# Patient Record
Sex: Male | Born: 1935 | Race: White | Hispanic: No | Marital: Married | Smoking: Current some day smoker
Health system: Southern US, Community
[De-identification: ages and names within clinical notes are randomized; demographics above are authoritative.]

## PROBLEM LIST (undated history)

## (undated) DIAGNOSIS — I2510 Atherosclerotic heart disease of native coronary artery without angina pectoris: Secondary | ICD-10-CM

## (undated) DIAGNOSIS — I714 Abdominal aortic aneurysm, without rupture, unspecified: Secondary | ICD-10-CM

## (undated) DIAGNOSIS — I219 Acute myocardial infarction, unspecified: Secondary | ICD-10-CM

## (undated) DIAGNOSIS — I10 Essential (primary) hypertension: Secondary | ICD-10-CM

## (undated) DIAGNOSIS — R0989 Other specified symptoms and signs involving the circulatory and respiratory systems: Secondary | ICD-10-CM

## (undated) DIAGNOSIS — J398 Other specified diseases of upper respiratory tract: Secondary | ICD-10-CM

## (undated) DIAGNOSIS — N400 Enlarged prostate without lower urinary tract symptoms: Secondary | ICD-10-CM

## (undated) DIAGNOSIS — E7800 Pure hypercholesterolemia, unspecified: Secondary | ICD-10-CM

## (undated) DIAGNOSIS — Z860100 Personal history of colon polyps, unspecified: Secondary | ICD-10-CM

## (undated) DIAGNOSIS — J40 Bronchitis, not specified as acute or chronic: Secondary | ICD-10-CM

## (undated) DIAGNOSIS — N323 Diverticulum of bladder: Secondary | ICD-10-CM

## (undated) DIAGNOSIS — R350 Frequency of micturition: Secondary | ICD-10-CM

## (undated) DIAGNOSIS — J449 Chronic obstructive pulmonary disease, unspecified: Secondary | ICD-10-CM

## (undated) DIAGNOSIS — J189 Pneumonia, unspecified organism: Secondary | ICD-10-CM

## (undated) DIAGNOSIS — N401 Enlarged prostate with lower urinary tract symptoms: Secondary | ICD-10-CM

## (undated) DIAGNOSIS — R0602 Shortness of breath: Secondary | ICD-10-CM

## (undated) DIAGNOSIS — R238 Other skin changes: Secondary | ICD-10-CM

## (undated) DIAGNOSIS — C801 Malignant (primary) neoplasm, unspecified: Secondary | ICD-10-CM

## (undated) DIAGNOSIS — R233 Spontaneous ecchymoses: Secondary | ICD-10-CM

## (undated) DIAGNOSIS — N138 Other obstructive and reflux uropathy: Secondary | ICD-10-CM

## (undated) DIAGNOSIS — L853 Xerosis cutis: Secondary | ICD-10-CM

## (undated) HISTORY — DX: Acute myocardial infarction, unspecified: I21.9

## (undated) HISTORY — PX: SKIN SURGERY: SHX2413

## (undated) HISTORY — DX: Pure hypercholesterolemia, unspecified: E78.00

## (undated) HISTORY — DX: Frequency of micturition: R35.0

## (undated) HISTORY — DX: Essential (primary) hypertension: I10

## (undated) HISTORY — DX: Other specified diseases of upper respiratory tract: J39.8

## (undated) HISTORY — DX: Bronchitis, not specified as acute or chronic: J40

## (undated) HISTORY — DX: Atherosclerotic heart disease of native coronary artery without angina pectoris: I25.10

## (undated) HISTORY — DX: Malignant (primary) neoplasm, unspecified: C80.1

## (undated) HISTORY — PX: HERNIA REPAIR: SHX51

## (undated) HISTORY — PX: COLONOSCOPY: SHX174

## (undated) HISTORY — DX: Chronic obstructive pulmonary disease, unspecified: J44.9

## (undated) HISTORY — DX: Shortness of breath: R06.02

---

## 2001-07-01 ENCOUNTER — Encounter: Payer: Self-pay | Admitting: Internal Medicine

## 2001-07-01 ENCOUNTER — Ambulatory Visit (HOSPITAL_COMMUNITY): Admission: RE | Admit: 2001-07-01 | Discharge: 2001-07-01 | Payer: Self-pay | Admitting: Internal Medicine

## 2001-07-10 ENCOUNTER — Other Ambulatory Visit: Admission: RE | Admit: 2001-07-10 | Discharge: 2001-07-10 | Payer: Self-pay | Admitting: Dermatology

## 2002-01-21 ENCOUNTER — Observation Stay (HOSPITAL_COMMUNITY): Admission: RE | Admit: 2002-01-21 | Discharge: 2002-01-22 | Payer: Self-pay | Admitting: Pediatrics

## 2002-08-13 ENCOUNTER — Other Ambulatory Visit: Admission: RE | Admit: 2002-08-13 | Discharge: 2002-08-13 | Payer: Self-pay | Admitting: Dermatology

## 2003-03-10 ENCOUNTER — Other Ambulatory Visit: Admission: RE | Admit: 2003-03-10 | Discharge: 2003-03-10 | Payer: Self-pay | Admitting: Dermatology

## 2003-04-15 ENCOUNTER — Other Ambulatory Visit: Admission: RE | Admit: 2003-04-15 | Discharge: 2003-04-15 | Payer: Self-pay | Admitting: Dermatology

## 2003-10-18 ENCOUNTER — Ambulatory Visit (HOSPITAL_COMMUNITY): Admission: RE | Admit: 2003-10-18 | Discharge: 2003-10-18 | Payer: Self-pay | Admitting: Family Medicine

## 2004-05-25 ENCOUNTER — Other Ambulatory Visit: Admission: RE | Admit: 2004-05-25 | Discharge: 2004-05-25 | Payer: Self-pay | Admitting: Unknown Physician Specialty

## 2004-10-11 ENCOUNTER — Ambulatory Visit (HOSPITAL_COMMUNITY): Admission: RE | Admit: 2004-10-11 | Discharge: 2004-10-11 | Payer: Self-pay | Admitting: Family Medicine

## 2004-10-22 HISTORY — PX: CORONARY ANGIOPLASTY WITH STENT PLACEMENT: SHX49

## 2005-12-31 ENCOUNTER — Ambulatory Visit (HOSPITAL_COMMUNITY): Admission: RE | Admit: 2005-12-31 | Discharge: 2005-12-31 | Payer: Self-pay | Admitting: Family Medicine

## 2006-01-01 ENCOUNTER — Ambulatory Visit (HOSPITAL_COMMUNITY): Admission: RE | Admit: 2006-01-01 | Discharge: 2006-01-01 | Payer: Self-pay | Admitting: Family Medicine

## 2006-01-03 ENCOUNTER — Ambulatory Visit (HOSPITAL_COMMUNITY): Admission: RE | Admit: 2006-01-03 | Discharge: 2006-01-03 | Payer: Self-pay | Admitting: Family Medicine

## 2006-06-17 ENCOUNTER — Ambulatory Visit (HOSPITAL_COMMUNITY): Admission: RE | Admit: 2006-06-17 | Discharge: 2006-06-17 | Payer: Self-pay | Admitting: Family Medicine

## 2006-12-13 ENCOUNTER — Inpatient Hospital Stay (HOSPITAL_COMMUNITY): Admission: EM | Admit: 2006-12-13 | Discharge: 2006-12-20 | Payer: Self-pay | Admitting: Emergency Medicine

## 2006-12-17 HISTORY — PX: CORONARY ANGIOPLASTY WITH STENT PLACEMENT: SHX49

## 2007-01-09 ENCOUNTER — Encounter (HOSPITAL_COMMUNITY): Admission: RE | Admit: 2007-01-09 | Discharge: 2007-02-08 | Payer: Self-pay | Admitting: *Deleted

## 2007-02-10 ENCOUNTER — Encounter (HOSPITAL_COMMUNITY): Admission: RE | Admit: 2007-02-10 | Discharge: 2007-03-12 | Payer: Self-pay | Admitting: *Deleted

## 2007-03-13 ENCOUNTER — Encounter (HOSPITAL_COMMUNITY): Admission: RE | Admit: 2007-03-13 | Discharge: 2007-04-12 | Payer: Self-pay | Admitting: *Deleted

## 2007-10-16 ENCOUNTER — Inpatient Hospital Stay (HOSPITAL_COMMUNITY): Admission: EM | Admit: 2007-10-16 | Discharge: 2007-10-17 | Payer: Self-pay | Admitting: Emergency Medicine

## 2007-12-03 ENCOUNTER — Ambulatory Visit (HOSPITAL_COMMUNITY): Admission: RE | Admit: 2007-12-03 | Discharge: 2007-12-03 | Payer: Self-pay | Admitting: Family Medicine

## 2009-03-28 ENCOUNTER — Ambulatory Visit (HOSPITAL_COMMUNITY): Admission: RE | Admit: 2009-03-28 | Discharge: 2009-03-28 | Payer: Self-pay | Admitting: Family Medicine

## 2009-04-01 ENCOUNTER — Observation Stay (HOSPITAL_COMMUNITY): Admission: EM | Admit: 2009-04-01 | Discharge: 2009-04-03 | Payer: Self-pay | Admitting: Emergency Medicine

## 2009-05-24 HISTORY — PX: US ECHOCARDIOGRAPHY: HXRAD669

## 2009-10-06 ENCOUNTER — Ambulatory Visit (HOSPITAL_COMMUNITY): Admission: RE | Admit: 2009-10-06 | Discharge: 2009-10-06 | Payer: Self-pay | Admitting: Family Medicine

## 2010-04-29 ENCOUNTER — Emergency Department (HOSPITAL_COMMUNITY): Admission: EM | Admit: 2010-04-29 | Discharge: 2010-04-30 | Payer: Self-pay | Admitting: Emergency Medicine

## 2010-06-05 ENCOUNTER — Ambulatory Visit: Payer: Self-pay | Admitting: Surgery

## 2010-07-20 ENCOUNTER — Ambulatory Visit: Payer: Self-pay | Admitting: Internal Medicine

## 2010-07-20 DIAGNOSIS — R198 Other specified symptoms and signs involving the digestive system and abdomen: Secondary | ICD-10-CM | POA: Insufficient documentation

## 2010-07-20 DIAGNOSIS — R634 Abnormal weight loss: Secondary | ICD-10-CM | POA: Insufficient documentation

## 2010-07-20 DIAGNOSIS — R6881 Early satiety: Secondary | ICD-10-CM

## 2010-07-20 DIAGNOSIS — K625 Hemorrhage of anus and rectum: Secondary | ICD-10-CM | POA: Insufficient documentation

## 2010-07-22 HISTORY — PX: COLONOSCOPY: SHX174

## 2010-07-22 HISTORY — PX: ESOPHAGOGASTRODUODENOSCOPY: SHX1529

## 2010-07-26 ENCOUNTER — Ambulatory Visit (HOSPITAL_COMMUNITY): Admission: RE | Admit: 2010-07-26 | Discharge: 2010-07-26 | Payer: Self-pay | Admitting: Internal Medicine

## 2010-07-26 ENCOUNTER — Ambulatory Visit: Payer: Self-pay | Admitting: Internal Medicine

## 2010-07-28 ENCOUNTER — Encounter: Payer: Self-pay | Admitting: Internal Medicine

## 2010-07-28 ENCOUNTER — Telehealth (INDEPENDENT_AMBULATORY_CARE_PROVIDER_SITE_OTHER): Payer: Self-pay

## 2010-11-21 NOTE — Letter (Signed)
Summary: Patient Notice, Colon Biopsy Results  [facility]  [address]   [place], [place] [address]   Phone: [phone]  Fax: [phone]       July 28, 2010   Rulo Sires [address] [place], [place]  [address] 01/09/1932    Dear Mr. Sheckler,  I am pleased to inform you that the biopsies taken during your recent colonoscopy did not show any evidence of cancer upon pathologic examination.  Additional information/recommendations:  You should have a repeat colonoscopy examination  in 3 years.  Please call us if you are having persistent problems or have questions about your condition that have not been fully answered at this time.  Sincerely,    R. Roetta Sessions MD, FACP [facility] Ph: [phone]    Fax: [phone]   Appended Document: Patient Notice, Colon Biopsy Results letter mailed to pt  Appended Document: Patient Notice, Colon Biopsy Results REMINDER IN COMPUTER

## 2010-11-21 NOTE — Progress Notes (Signed)
Summary: pt has questions about polyps  Phone Note Call from Patient   Caller: Patient Summary of Call: Pt came by office. Had some concerns in reference to polyps removed. He said that he thought Dr. Jena Gauss told him that he could not remove rectal polyps. He knows he has a rectal polyp and also just had a knot come up between his scrotum and his leg yesterday.  He wants to know if he should see a dermatologist or a surgeon (Has seen Dr. Margo Aye and Dr. Malvin Johns in the past) Please advise! Initial call taken by: Cloria Spring LPN,  July 28, 2010 1:11 PM     Appended Document: pt has questions about polyps polyps inside rectum and colon were removed; new "knot" as decribed - ot likely GI in origin; might star with PCP  Appended Document: pt has questions about polyps Pt was informed.

## 2010-11-21 NOTE — Assessment & Plan Note (Signed)
Summary: CONSULT FOR TCS. R SIDE PAIN,POLYPS/SS   Visit Type:  new patient Primary Care Provider:  [facility], Dr. Sherwood Gambler  Chief Complaint:  wants tcs.  History of Present Illness: Karl Walker is a pleasant 77 y/o WM, patient of [employer], who presents as self-referral for further evaluation of weight loss, early satiety, change in bowels.   Last few months, change in bowels. Went from daily BM to skipping up to two days without BM.  Never had TCS before. Checked around bottom with mirror and thinks he saw polpys. Some intermittent brbpr, few times in last few years. Never feels hungry. Eats because he has to. Feels full easily.  At age 38, 206 pounds. At 75, 170 pounds. Now 151. Eats regularly but cannot gain weight. No heartburn, n/v, dysphagia, abd pain.  At age 89, took flu shot, in three weeks "got the flu". Next year, took PNA shot and "got pneumonia". Lost 50 pounds. Now unable to excersize. MI and one stent in 2008. Has had PNA total of three times.   CT A/P 7/11, Tiny bilateral nonobstructing renal calculi.  Marked prostatic enlargement. Abdominal aortic aneurysm 4.0 x 3.5 cm, increased since 2007. 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 Question left inguinal hernia.  Questionable thickening of anterior wall of stomach versus artifact from underdistension, recommend follow-up upper GI exam or endoscopy to exclude gastric mass.  Bilateral spondylolysis L5 with grade 1 anterolisthesis.  Current Medications (verified): 1)  Hydrocodone-Acetaminophen 5-325 Mg Tabs (Hydrocodone-Acetaminophen) .... As Needed 2)  Niaspan 500 Mg Cr-Tabs (Niacin (Antihyperlipidemic)) .... Once Daily 3)  Simvastatin 40 Mg Tabs (Simvastatin) .... Once Daily 4)  Losartan Potassium-Hctz 50-12.5 Mg Tabs (Losartan Potassium-Hctz) .... Once Daily 5)  Finasteride 5 Mg Tabs (Finasteride) .... Once Daily 6)  Aspirin 325 Mg Tabs (Aspirin) .... Once Daily 7)  Vitamin D-3 1000iu .... Once  Daily  Allergies (verified): 1)  ! Iodine  Past History:  Past Medical History: TB at age 35 COPD CAD, MI and stent, 2008 BPH, previous benign bx, followed by Dr. Earlene Plater. Hypertension Nephrolithiasis Hyperlipidemia Infrarenal AAA, 4cm, followed by Dr. Myra Gianotti  Past Surgical History: Right inguinal hernia, 2003 Nasal basal cell carcinoma X 2  Family History: No FH CRC. No liver disease.   Social History: Married. Two children. Retired, [employer]. Occasional cig use. Never smoked more than one ppd entire life. This year has smoked about one pack of cig. No alcohol.   Review of Systems General:  Complains of anorexia, weakness, and weight loss; denies fever, chills, sweats, and fatigue. Eyes:  Denies vision loss. ENT:  Denies nasal congestion, sore throat, hoarseness, and difficulty swallowing. CV:  Complains of dyspnea on exertion; denies chest pains, angina, palpitations, and peripheral edema. Resp:  Complains of wheezing; denies dyspnea at rest, dyspnea with exercise, cough, and sputum. GI:  See HPI. GU:  Denies urinary burning and blood in urine. MS:  Denies joint pain / LOM. Derm:  Denies rash and itching. Neuro:  Denies weakness, frequent headaches, memory loss, and confusion. Psych:  Denies depression and anxiety. Endo:  Complains of unusual weight change. Heme:  Denies bruising and bleeding. Allergy:  Denies hives and rash.  Vital Signs:  Patient profile:   77 year old male Height:      71 inches Weight:      151 pounds BMI:     21.14 Temp:     98.6 degrees F oral Pulse rate:   88 / minute BP sitting:   148 /  98  (right arm) Cuff size:   regular  Vitals Entered By: Hendricks Limes LPN (July 20, 2010 1:46 PM)  Physical Exam  General:  Thin, elderly WM, in NAD. Head:  Normocephalic and atraumatic. Eyes:  no scleral icterus Mouth:  Oropharyngeal mucosa moist, pink.  No lesions, erythema or exudate.    Neck:  Supple; no masses or thyromegaly. Lungs:  Clear  throughout to auscultation. Heart:  Regular rate and rhythm; no murmurs, rubs,  or bruits. Abdomen:  Bowel sounds normal.  Abdomen is soft, nontender, nondistended.  No rebound or guarding.  No hepatosplenomegaly, masses or hernias.  No abdominal bruits.  Rectal:  deferred until time of colonoscopy.   Extremities:  No clubbing, cyanosis, edema or deformities noted. Neurologic:  Alert and  oriented x4;  grossly normal neurologically. Skin:  Intact without significant lesions or rashes. Cervical Nodes:  No significant cervical adenopathy. Psych:  Alert and cooperative. Normal mood and affect.  Impression & Recommendations:  Problem # 1:  CHANGE IN BOWELS (ICD-787.99)  Two month h/o change in bowels. Patient reports seeing couple "polyps" around perianal area. Suspect hemorrhoids. No prior TCS. Some intermiitent brbpr. Gradual weight loss over last five-6 years. Colonoscopy to be performed in near future.  Risks, alternatives, and benefits including but not limited to the risk of reaction to medication, bleeding, infection, and perforation were addressed.  Patient voiced understanding and provided verbal consent.   Orders: New Patient Level III (41324)  Problem # 2:  WEIGHT LOSS (ICD-783.21)  Weight loss in setting of multiple episodes of PNA/MI over course of last five years. Patient had CT couple of months ago with abnormal appearing stomach vs underdistention. Given early satiety, recommend EGD. EGD to be performed in near future.  Risks, alternatives, benefits including but not limited to risk of reaction to medications, bleeding, infection, and perforation addressed.  Patient voiced understanding and verbal consent obtained.   Orders: New Patient Level III [phone])

## 2010-11-21 NOTE — Letter (Signed)
Summary: TCS/EGD ORDER  TCS/EGD ORDER   Imported By: Ave Filter 07/20/2010 14:56:19  _____________________________________________________________________  External Attachment:    Type:   Image     Comment:   External Document

## 2011-01-07 LAB — URINE CULTURE
Colony Count: NO GROWTH
Culture: NO GROWTH

## 2011-01-07 LAB — BASIC METABOLIC PANEL
BUN: 18 mg/dL (ref 6–23)
CO2: 25 mEq/L (ref 19–32)
Calcium: 9.4 mg/dL (ref 8.4–10.5)
Chloride: 104 mEq/L (ref 96–112)
Creatinine, Ser: 0.87 mg/dL (ref 0.4–1.5)
GFR calc Af Amer: >60 mL/min (ref >60)
GFR calc non Af Amer: >60 mL/min (ref >60)
Glucose, Bld: 102 mg/dL — ABNORMAL HIGH (ref 70–99)
Potassium: 3.7 mEq/L (ref 3.5–5.1)
Sodium: 135 mEq/L (ref 135–145)

## 2011-01-07 LAB — URINALYSIS, ROUTINE W REFLEX MICROSCOPIC
Bilirubin Urine: NEGATIVE
Glucose, UA: NEGATIVE mg/dL
Nitrite: NEGATIVE
Protein, ur: NEGATIVE mg/dL
Specific Gravity, Urine: >1.03 — ABNORMAL HIGH (ref 1.005–1.030)
Urobilinogen, UA: 0.2 mg/dL (ref 0.0–1.0)
pH: 6 (ref 5.0–8.0)

## 2011-01-07 LAB — CBC
HCT: 51.9 % (ref 39.0–52.0)
Hemoglobin: 17.3 g/dL — ABNORMAL HIGH (ref 13.0–17.0)
MCH: 31.5 pg (ref 26.0–34.0)
MCHC: 33.4 g/dL (ref 30.0–36.0)
MCV: 94.4 fL (ref 78.0–100.0)
Platelets: 139 10*3/uL — ABNORMAL LOW (ref 150–400)
RBC: 5.5 MIL/uL (ref 4.22–5.81)
RDW: 14.9 % (ref 11.5–15.5)
WBC: 10.9 10*3/uL — ABNORMAL HIGH (ref 4.0–10.5)

## 2011-01-07 LAB — URINE MICROSCOPIC-ADD ON

## 2011-01-07 LAB — DIFFERENTIAL
Basophils Absolute: 0 10*3/uL (ref 0.0–0.1)
Basophils Relative: 0 % (ref 0–1)
Eosinophils Absolute: 0.2 10*3/uL (ref 0.0–0.7)
Eosinophils Relative: 2 % (ref 0–5)
Lymphocytes Relative: 10 % — ABNORMAL LOW (ref 12–46)
Lymphs Abs: 1 10*3/uL (ref 0.7–4.0)
Monocytes Absolute: 0.7 10*3/uL (ref 0.1–1.0)
Monocytes Relative: 6 % (ref 3–12)
Neutro Abs: 9 10*3/uL — ABNORMAL HIGH (ref 1.7–7.7)
Neutrophils Relative %: 82 % — ABNORMAL HIGH (ref 43–77)

## 2011-01-29 LAB — CULTURE, BLOOD (ROUTINE X 2)
Culture: NO GROWTH
Report Status: 6162010
Report Status: 6162010

## 2011-01-29 LAB — LIPID PANEL
Cholesterol: 104 mg/dL (ref 0–200)
HDL: 46 mg/dL (ref >39)
Total CHOL/HDL Ratio: 2.3 RATIO
Triglycerides: 51 mg/dL (ref <150)

## 2011-01-29 LAB — BASIC METABOLIC PANEL
BUN: 20 mg/dL (ref 6–23)
BUN: 22 mg/dL (ref 6–23)
CO2: 31 mEq/L (ref 19–32)
CO2: 33 mEq/L — ABNORMAL HIGH (ref 19–32)
Calcium: 9.1 mg/dL (ref 8.4–10.5)
Calcium: 9.7 mg/dL (ref 8.4–10.5)
Chloride: 102 mEq/L (ref 96–112)
Creatinine, Ser: 0.94 mg/dL (ref 0.4–1.5)
Creatinine, Ser: 1.07 mg/dL (ref 0.4–1.5)
GFR calc Af Amer: >60 mL/min (ref >60)
GFR calc non Af Amer: >60 mL/min (ref >60)
GFR calc non Af Amer: >60 mL/min (ref >60)
Glucose, Bld: 126 mg/dL — ABNORMAL HIGH (ref 70–99)
Glucose, Bld: 160 mg/dL — ABNORMAL HIGH (ref 70–99)
Potassium: 3.7 mEq/L (ref 3.5–5.1)
Sodium: 141 mEq/L (ref 135–145)

## 2011-01-29 LAB — DIFFERENTIAL
Basophils Relative: 0 % (ref 0–1)
Basophils Relative: 0 % (ref 0–1)
Eosinophils Absolute: 0 10*3/uL (ref 0.0–0.7)
Eosinophils Relative: 0 % (ref 0–5)
Lymphs Abs: 0.7 10*3/uL (ref 0.7–4.0)
Lymphs Abs: 0.8 10*3/uL (ref 0.7–4.0)
Monocytes Absolute: 1.9 10*3/uL — ABNORMAL HIGH (ref 0.1–1.0)
Monocytes Absolute: 2 10*3/uL — ABNORMAL HIGH (ref 0.1–1.0)
Monocytes Relative: 14 % — ABNORMAL HIGH (ref 3–12)
Monocytes Relative: 14 % — ABNORMAL HIGH (ref 3–12)
Neutro Abs: 12.2 10*3/uL — ABNORMAL HIGH (ref 1.7–7.7)
Neutrophils Relative %: 80 % — ABNORMAL HIGH (ref 43–77)
Neutrophils Relative %: 81 % — ABNORMAL HIGH (ref 43–77)

## 2011-01-29 LAB — BRAIN NATRIURETIC PEPTIDE: Pro B Natriuretic peptide (BNP): 107 pg/mL — ABNORMAL HIGH (ref 0.0–100.0)

## 2011-01-29 LAB — CBC
HCT: 51 % (ref 39.0–52.0)
Hemoglobin: 17.4 g/dL — ABNORMAL HIGH (ref 13.0–17.0)
MCHC: 34.1 g/dL (ref 30.0–36.0)
MCHC: 34.9 g/dL (ref 30.0–36.0)
MCV: 94.5 fL (ref 78.0–100.0)
Platelets: 162 10*3/uL (ref 150–400)
Platelets: 185 10*3/uL (ref 150–400)
RBC: 5.4 MIL/uL (ref 4.22–5.81)
RDW: 14.3 % (ref 11.5–15.5)
RDW: 14.6 % (ref 11.5–15.5)
WBC: 13.3 10*3/uL — ABNORMAL HIGH (ref 4.0–10.5)

## 2011-01-29 LAB — BLOOD GAS, ARTERIAL
Acid-Base Excess: 4.1 mmol/L — ABNORMAL HIGH (ref 0.0–2.0)
Bicarbonate: 28.1 mEq/L — ABNORMAL HIGH (ref 20.0–24.0)
O2 Content: 4 L/min
O2 Saturation: 94 %
Patient temperature: 37
TCO2: 23.5 mmol/L (ref 0–100)
pCO2 arterial: 42.2 mmHg (ref 35.0–45.0)
pH, Arterial: 7.438 (ref 7.350–7.450)
pO2, Arterial: 70.6 mmHg — ABNORMAL LOW (ref 80.0–100.0)

## 2011-01-29 LAB — COMPREHENSIVE METABOLIC PANEL
ALT: 14 U/L (ref 0–53)
Albumin: 2.7 g/dL — ABNORMAL LOW (ref 3.5–5.2)
Alkaline Phosphatase: 44 U/L (ref 39–117)
BUN: 16 mg/dL (ref 6–23)
Calcium: 8.8 mg/dL (ref 8.4–10.5)
Glucose, Bld: 136 mg/dL — ABNORMAL HIGH (ref 70–99)
Potassium: 3.9 mEq/L (ref 3.5–5.1)
Sodium: 142 mEq/L (ref 135–145)
Total Protein: 5.4 g/dL — ABNORMAL LOW (ref 6.0–8.3)

## 2011-01-29 LAB — TSH: TSH: 0.154 u[IU]/mL — ABNORMAL LOW (ref 0.350–4.500)

## 2011-02-23 ENCOUNTER — Ambulatory Visit (INDEPENDENT_AMBULATORY_CARE_PROVIDER_SITE_OTHER): Payer: Medicare HMO | Admitting: Urology

## 2011-02-23 DIAGNOSIS — N529 Male erectile dysfunction, unspecified: Secondary | ICD-10-CM

## 2011-02-23 DIAGNOSIS — N138 Other obstructive and reflux uropathy: Secondary | ICD-10-CM

## 2011-02-23 DIAGNOSIS — R82998 Other abnormal findings in urine: Secondary | ICD-10-CM

## 2011-02-23 DIAGNOSIS — N3020 Other chronic cystitis without hematuria: Secondary | ICD-10-CM

## 2011-03-06 NOTE — H&P (Signed)
NAME:  CLAUDIA, ALVIZO                ACCOUNT NO.:  [id number]   MEDICAL RECORD NO.:  [id number]          PATIENT TYPE:  OBV   LOCATION:  A316                          FACILITY:  [facility]   PHYSICIAN:  Renee Ramus, MD       DATE OF BIRTH:  02/04/1932   DATE OF ADMISSION:  04/01/2009  DATE OF DISCHARGE:  LH                              HISTORY & PHYSICAL   HISTORY OF PRESENT ILLNESS:  The patient is a 77 year old male who was  seen in the emergency department secondary to increasing cough and  fevers x5 days prior to admission.  The patient was seen by his primary  care physician and 5 days of azithromycin and prednisone without effect.  The patient presents now with increasing cough and subjective fever.   PRIMARY CARE PHYSICIAN:  Kirk Ruths, MD   PAST MEDICAL HISTORY:  1. Coronary artery disease, status post stent in 2008.  2. Benign prostatic hypertrophy.  3. Hypertension.  4. COPD.  5. Hyperlipidemia.   SOCIAL HISTORY:  The patient lives with his wife.  Heavy smoking  history, but quit approximately 2 years prior.  No alcohol history.   FAMILY HISTORY:  Not available.   REVIEW OF SYSTEMS:  All other comprehensive review systems are negative.   ALLERGIES:  The patient has allergy to IODINE.   CURRENT MEDICATIONS:  1. Benicar/hydrochlorothiazide 20/12.5 one p.o. daily.  2. Simvastatin 40 mg p.o. daily.  3. Avodart 0.5 mg p.o. daily.  4. Plavix 75 mg p.o. daily.  5. Toprol-XL 50 mg p.o. daily.  6. Niaspan ER 500 mg p.o. daily.  7. Recently completed prednisone taper.  8. Aspirin 81 mg p.o. daily.  9. Azithromycin and recently completed Z-Pak.   PHYSICAL EXAMINATION:  GENERAL:  Well-developed, well-nourished white  male, currently in no apparent distress.  VITAL SIGNS:  Blood pressure 152/77, heart rate 106, respiratory rate  22, and temperature 99.1.  HEENT: No jugular venous distention or lymphadenopathy.  Oropharynx is  clear.  Mucous membranes pink and moist.   TMs clear bilaterally.  Pupils  equal,  reactive to light and accommodation.  Extraocular muscles are  intact.  CARDIOVASCULAR:  Regular rate and rhythm without murmurs, rubs, or  gallops.  PULMONARY:  The patient has coarse rales without rhonchi bilaterally,  probably slightly increased in the right lower.  ABDOMEN:  Soft, nontender, nondistended without hepatosplenomegaly.  Bowel sounds are present.  He has no rebound or guarding.  EXTREMITIES:  He has no clubbing, cyanosis, or edema.  He has good  peripheral pulses in dorsalis pedis and radial arteries.  He is able to  move all extremities.  NEUROLOGIC:  Cranial nerves II-XII are grossly intact.  He has no focal  neurological deficits.   STUDIES:  Chest x-ray shows COPD with right lower lobe infiltrate.   LABORATORY DATA:  White count 13.3, H and H 17.4 and 51, MCV 94,  platelets 185.  Sodium 141, potassium 3.7, chloride 102, bicarb 31, BUN  22, creatinine 0.9, glucose 126, and BNP of 107.  EKG shows sinus tach.   ASSESSMENT  AND PLAN:  1. Pneumonia.  We will treat with ciprofloxacin.  2. Chronic obstructive pulmonary disease exacerbation, will prednisone      with a slow taper, add nebs as needed.  3. Coronary artery disease.  Currently, the patient has been on Plavix      and aspirin for 3 years since he had a stent placed, guidelines      would suggest that he would do just fine without the Plavix and so      we will discontinue Plavix and continue aspirin.  4. Hypercholesterolemia.  We will check lipid panel.  Continue statin      for now, hold Niaspan.  5. Hypertension.  We will discontinue hydrochlorothiazide, start      captopril and transition to lisinopril at discharge.  6. Benign prostatic hypertrophy.  Continue Avodart.  7. Disposition.  The patient is full code.  H and P was constructed by      reviewing past medical history, conferring with emergency medical      room physician, reviewing the emergency medical  record.  Time spent      1 hour.      Renee Ramus, MD  Electronically Signed     JF/MEDQ  D:  04/01/2009  T:  04/02/2009  Job:  [id number]   cc:   Kirk Ruths, M.D.  Fax: [phone]

## 2011-03-06 NOTE — Discharge Summary (Signed)
NAME:  Karl [name], Karl [name]                ACCOUNT NO.:  [id number]   MEDICAL RECORD NO.:  [id number]          PATIENT TYPE:  OBV   LOCATION:  A316                          FACILITY:  [facility]   PHYSICIAN:  Osvaldo Shipper, MD     DATE OF BIRTH:  [birth year]   DATE OF ADMISSION:  04/01/2009  DATE OF DISCHARGE:  06/13/2010LH                               DISCHARGE SUMMARY   The patient's PMD is Dr. Nobie Putnam.   Please review H and P dictated at the time of admission for details  regarding the patient's presenting illness.   DISCHARGE DIAGNOSES:  1. Chronic obstructive pulmonary disease exacerbation with pneumonia,      improving.  2. Coronary artery disease stable.  3. Hypertension stable.   BRIEF HOSPITAL COURSE:  Briefly, this is a 77 year old Caucasian male  who presented to the hospital with almost 1-week history of shortness of  breath and cough.  He was being treated as an outpatient by his PMD,  however, the patient continued to get worse and so he decided to show up  in the ED on Friday.  He was admitted to the hospital.  X-ray showed  pneumonia.  He was started on antibiotics.  Over the last day and a half  he has been feeling much better.  He is breathing much better.  Cough is  improving.  Expectoration is coming down.  Denies any chest pain.  He  has not had any fever and he is keen on going home.  Rest of his medical  issues have been stable.  I emphasized the importance of smoking  cessation.  I emphasized the importance of taking and being compliant  with his medication regimen.   PHYSICAL EXAMINATION:  GENERAL:  Today, the patient is feeling well.  He  has ambulated from the bathroom to his bed with no difficulties.  He has  been off the oxygen and he is feeling much better.  VITAL SIGNS:  His sats were checked and they were ranging between 89-95%  depending on whether he was talking or breathing deeply.  His  respiratory rate is normal.  He is not using any accessory  muscles.  LUNGS:  Clear to auscultation bilaterally.   LABORATORY DATA:  Labs today are unremarkable and stable.   His lipid panel was checked and was normal.  His free T4 was 1.34.  Blood cultures have not shown any growth.   At this time, the patient is stable for discharge.  I have told him not  to exert himself at home.  I have told him to take his nebulizer  treatments at least 4 times a day and be compliant with his medications.   DISCHARGE MEDICATIONS:  1. Advair 250/50 b.i.d.  2. Levaquin 500 mg once daily for 5 days.  3. Prednisone 60 mg daily for 5 days, followed by 40 mg daily for 5      days, followed by 20 mg daily for 5 days, and then stop.  4. He needs to take albuterol and Atrovent nebs 4 times daily every 6  hours until he sees his doctor.  5. Continue with home medicines which include Benicar HCT 20/12.5      daily.  6. Simvastatin 40 mg daily.  7. Avodart 0.5 mg daily.  8. Plavix 75 mg daily.  9. Toprol-XL 50 mg daily.  10.Niaspan ER 500 mg daily.  11.Aspirin 81 mg daily.   FOLLOWUP:  Follow up with his PMD in the 2-3 days.   DIET:  Heart healthy.   PHYSICAL ACTIVITY:  No exertion.   CONSULTATIONS:  No consultations obtained during this admission.   TOTAL TIME OF THIS ENCOUNTER:  35 minutes.      Osvaldo Shipper, MD  Electronically Signed     GK/MEDQ  D:  04/03/2009  T:  04/04/2009  Job:  [id number]   cc:   Patrica Duel, M.D.  Fax: [phone]

## 2011-03-06 NOTE — Consult Note (Signed)
NAME:  Karl [name], Karl [name]                ACCOUNT NO.:  [id number]   MEDICAL RECORD NO.:  [id number]          PATIENT TYPE:  INP   LOCATION:  A210                          FACILITY:  [facility]   PHYSICIAN:  Edward L. Juanetta Gosling, M.D.DATE OF BIRTH:  10-Aug-1932   DATE OF CONSULTATION:  10/17/2007  DATE OF DISCHARGE:                                 CONSULTATION   REASON FOR CONSULTATION:  COPD.   [name] is a 77 year old who came to the a emergency room because of  shortness of breath.  He has a long smoking history, says he smoked all  my life until about March of this year at which point he stopped  smoking after an acute MI.  His wife, however, does still smoke, and he  says he feels like he does get some effect with that.  He was in his  usual state of fair health at home, said that he had been able to be  fairly active, did not notice any shortness of breath to any great  degree, and then he, on the morning of admission, became much worse.  He  had some shortness of breath for a day or two previously but became much  worse on the morning of admission.  He was wheezing and came to the  emergency room.  He had been coughing up some yellowish sputum.  He felt  like he might have had some fever, but he did not actually measure his  temperature. He had been wheezing.  He did not have any chest pain.  He  has not had any symptoms of congestive heart failure, although he does  have a low ejection fraction seen after his myocardial infarction.   MEDICATIONS AT HOME:  1. Benicar/hydrochlorothiazide 20/12.5 daily.  2. Simvastatin 40 mg daily.  3. Avodart 0.5 daily.  4. Plavix 75 mg daily.  5. Niaspan ER 500 mg daily.  6. Toprol XL 50 mg daily.  7. Aspirin 81 mg daily.   ALLERGIES:  He has an allergy to IODINE and SHELLFISH.Marland Kitchen   PAST MEDICAL HISTORY:  1. He had a myocardial infarction in March 2008.  2. He has a history of BPH.  He does have an elevated PSA. He has been      on Avodart, and this  has worked very well, but he does do      intermittent self catheterizations at home..  3. He has hypertension.  4. Previous history of COPD.  5. Pneumonia.  6. Surgically, he has had a hernia repair and has had basal cell      carcinoma on his face.  7. He says he had childhood TB.  The details of that are not quite      clear, but it sounds like he was treated with INH.   SOCIAL HISTORY:  He has about a 40 to 50-pack-year smoking history.  He  does not use any alcohol, does not use any illicit drugs.  He is  married, lives at home with his wife.   FAMILY HISTORY:  Positive for TB, positive for arthritis.  REVIEW OF SYSTEMS:  Otherwise is negative.  No weight loss.  No  hemoptysis.  No chest pain.   PHYSICAL EXAMINATION:  GENERAL:  He looks pretty comfortable.  VITAL SIGNS:  His O2Sat was 83% on room air on admission.  He says he is  back to about 95% of normal.  His temperature is 98.2, pulse 84,  respirations 16, blood pressure 101/60, O2Sat 92% on 2 liters.  CHEST:  Clear without wheezes.  He does have some rhonchi.  HEART:  Regular without gallop.  I do not hear a murmur.  ABDOMEN:  Soft.  Bowel sounds present and active.  EXTREMITIES:  Showed no edema.  CNS:  Grossly intact.   LABORATORY WORK:  White count 12,600, hemoglobin 14.9, platelets 193.  His electrolytes were normal.  BUN 23, creatinine 1.3.  His CK is  normal.   Chest x-ray showed no definite abnormality except for bronchitic  changes.   Chest CT negative for pulmonary embolism and showed a lot of mucus  plugging.  This would be consistent with his history.   ASSESSMENT:  I think he has chronic obstructive pulmonary disease,  apparently mostly a chronic bronchitic problem.   PLAN:  My plan would be for him to start on Advair. I thought about  Spiriva, but I think with his problems with his prostate,  particularly  with having to do intermittent self catheterizations, I think that using  Spiriva may cause  him trouble; so I think we should switch him over to  Advair.  He probably needs to have PFTs, etc., done as an outpatient.  I  have told him that I would be glad to see him in my office at Dr.  Geanie Logan discretion.   Thanks for allowing me to see him with you.      Edward L. Juanetta Gosling, M.D.  Electronically Signed     ELH/MEDQ  D:  10/17/2007  T:  10/17/2007  Job:  [id number]   cc:   Patrica Duel, M.D.  Fax: [phone]

## 2011-03-06 NOTE — Assessment & Plan Note (Signed)
OFFICE VISIT   [name], [name]  DOB:  05-10-1932                                       06/05/2010  EAVWU#:[id number]   REASON FOR VISIT:  Abdominal aortic aneurysm.   HISTORY:  This is a 77 year old gentleman I am seeing at the request of  Dr. Regino Schultze for evaluation of abdominal aneurysm.  The patient presented  to emergency department a couple of weeks ago with right flank and hip  pain.  He obtained a CT scan which revealed a 4 x 3.5-cm infrarenal  abdominal aortic aneurysm.   The patient's risk factors include hypertension and  hypercholesterolemia, both of which are medically managed.  Has a  history of heart attack in 2007, also suffers from COPD.  He continues  to smoke.  Two packs last him about 2 weeks.   REVIEW OF SYSTEMS:  GENERAL:  Positive weight loss.  CARDIAC:  Positive for shortness of breath with exertion.  PULMONARY:  Positive for bronchitis.  GU:  Positive for frequent urination.  All other review systems is negative.   PAST MEDICAL HISTORY:  Hypertension, hypercholesterolemia, history of MI  in 2007, COPD.   PAST SURGICAL HISTORY:  1. Coronary stent.  2. A hernia.   SOCIAL HISTORY:  He is married with 2 children.  He is retired.  Continues to smoke a pack a week.  Does not drink regular alcohol.   MEDICATIONS:  Percocet, Avodart, Benicar, Niaspan, simvastatin, aspirin,  vitamin D and fish oil.   ALLERGIES:  Iodine.   PHYSICAL EXAMINATION:  Heart rate 89, blood pressure 135/82, O2  saturation is 97%.  Generally, he is well-appearing, in no distress.  HEENT:  Within normal limits.  Lungs are clear bilaterally.  No wheezes  or rhonchi.  Cardiovascular:  Regular rate and rhythm.  No carotid  bruits.  Palpable pedal pulses.  Abdomen is soft, nontender.  His aorta  is readily palpable.  No hepatosplenomegaly.  Musculoskeletal:  Without  major deformity.  Neuro:  He has no focal deficits or paresthesias.  His  skin is without  rash.   DIAGNOSTIC STUDIES:  I have reviewed his CT scan, and it shows a 4-cm  aneurysm.   ASSESSMENT/PLAN:  Infrarenal abdominal aortic aneurysm.   PLAN:  I discussed the findings of the CT scan with the patient.  I  reiterated to him that I do not think that his pain is related to his  aneurysm.  We discussed continuing to follow as aneurysm bit making  recommendations for repair as he gets larger than 5 cm.  I will plan on  seeing him back in 1 year.  All of his questions were answered today.     Jorge Ny, MD  Electronically Signed   VWB/MEDQ  D:  06/05/2010  T:  06/06/2010  Job:  [id number]   cc:   Dr. Regino Schultze

## 2011-03-06 NOTE — Discharge Summary (Signed)
NAME:  Karl [name], Karl [name]                ACCOUNT NO.:  [id number]   MEDICAL RECORD NO.:  [id number]          PATIENT TYPE:  INP   LOCATION:  A210                          FACILITY:  [facility]   PHYSICIAN:  Marcello Moores, MD   DATE OF BIRTH:  30-Sep-1932   DATE OF ADMISSION:  10/16/2007  DATE OF DISCHARGE:  LH                               DISCHARGE SUMMARY   PRIMARY MEDICAL DOCTOR:  Patrica Duel, M.D.   DISCHARGING DIAGNOSES:  1. Bronchitis, resolving.  2. History of MI in February 2008 status post stent placement.  3. History of benign prostatic hypertrophy with elevated PCA.  He has      follow-up with urologist.  4. History of hypertension, fairly controlled.  5. History of pneumonia.  6. History of emphysema.   HOME MEDICATIONS:  1. Levaquin 500 mg p.o. daily for 5 days.  2. Benicar/hydrochlorothiazide 20/12.5 mg once a day.  3. Simvastatin 40 mg p.o. daily.  4. Avodart 0.5 mg once a day.  5. Plavix 75 mg p.o. once a day.  6. Niaspan ER 500 mg once a day.  7. Toprol XL 50 mg once a day.  8. Aspirin 81 mg once a day.  9. Xopenex 1.25 mg inhaler every 6 hours.   HOSPITAL COURSE:  [name] is a 77 year old man with history of  coronary artery disease status post MI and stent placement and  hypertension with history of smoking in the past who presented with  shortness of breath and was admitted with assessment of bronchitis.  Chest x-ray was done, and it was showing no acute cardiopulmonary  disease with COPD and CAT scan of the chest was also done to rule out  PE, and there was not any sign of PE or any tick dissection.  The  patient was put on Solu-Medrol and Levaquin antibiotic and the patient  responded well.  He remained afebrile and there was no leukocytosis, and  today he is very stable.  He will go home with p.o. medications as  mentioned above.   PHYSICAL EXAMINATION:  VITAL SIGNS: Today temperature 98, pulse is 84  and respiratory rate 16, blood pressure is 101/60 and  saturation is 95%  on 2 liters.  HEENT:  He has pink conjunctivae, nonicteric sclera.  NECK:  Supple.  CHEST:  He has good air entry bilaterally with some rhonchi.  Otherwise,  there is no wheezes.  CV: S1, S2 well heard.  ABDOMEN:  Soft area of tenderness.  EXTREMITIES:  No pedal edema.  CNS:  He is alert and well-oriented.   LABORATORY DATA:  White blood cells is 10.6, and hemoglobin is 14.9,  hematocrit is 45, platelet is 193.  Chemistry:  Sodium is 139, potassium  is 3.7, chloride 104 and bicarb is 26, glucose is 184, BUN 23 and  creatinine is 1.3.  blood culture is negative in 24 hours.   DISCHARGE/PLAN:  He will be discharged home today with the above  medications, and he will have follow-up with his PMD after 4 days.  His  elevated glucose is likely secondary to the Solu-Medrol  he was taking,  and the little bit elevated white blood cells also probably related to  his Solu-Medrol but needs to follow-up with his PMD.      Marcello Moores, MD  Electronically Signed     MT/MEDQ  D:  10/17/2007  T:  10/17/2007  Job:  [id number]

## 2011-03-06 NOTE — H&P (Signed)
NAME:  Karl [name], Karl [name]                ACCOUNT NO.:  [id number]   MEDICAL RECORD NO.:  [id number]          PATIENT TYPE:  INP   LOCATION:  A210                          FACILITY:  [facility]   PHYSICIAN:  Osvaldo Shipper, MD     DATE OF BIRTH:  April 08, 1932   DATE OF ADMISSION:  10/16/2007  DATE OF DISCHARGE:  LH                              HISTORY & PHYSICAL   PRIMARY CARE PHYSICIAN:  Patrica Duel, M.D.   He is also followed by Dr. Domingo Sep with [facility] and Dr.  Earlene Plater, who is a urologist is [facility] for B[facility].   ADMISSION DIAGNOSES:  1. Likely acute bronchitis.  2. History of coronary artery disease status post percutaneous      transluminal cardiac angioplasty.  3. Hypertension.  4. Benign prostatic hypertrophy with elevated PSA.   CHIEF COMPLAINT:  Shortness of breath since yesterday.   HISTORY OF PRESENT ILLNESS:  The patient is a 77 year old Caucasian male  who has a history of CAD who was a smoker.  He was in his usual state of  health until yesterday, or maybe one day before yesterday, when he  started feeling what he describes as congestion and shortness of breath.  These symptoms got worse today, and he decided to come into the ED.  He  has been having a cough with slight yellowish expectoration. He has been  having wheezing.  He denies any chest pain.  Shortness of breath is more  with exertion, and it is relieved with lying down.  He denies any leg  swelling.  Denies any fever or chills at home.  Denies any sick  contacts.  He says he quit smoking back in February when he had a heart  attack, but his wife continues to smoke heavily in the same house as he.   MEDICATIONS AT HOME:  1. Benicar/hydrochlorothiazide 20/12.5 mg once a day.  2. Simvastatin 40 mg once a day.  3. Avodart 0.5 mg once a day.  4. Plavix 75 mg once a day.  5. Niaspan ER 500 mg once a day.  6. Toprol XL 50 mg once a day.  7. Aspirin 81 mg once a day.   ALLERGIES:  Include IODINE and  SHRIMP.   PAST MEDICAL HISTORY:  1. MI in February 2008, and he was found to have an occluded RCA which      ws stented.  2. He has a history of benign prostatic hypertrophy with elevated PSA      and is followed by a urologist.  3. History of hypertension.  4. History of pneumonia one year ago.  5. He also has a history of emphysema which has never been properly      evaluated.   OTHER SURGICAL HISTORY:  1. Surgery for hernia repair.  2. Two surgeries for basal cell carcinoma on his face.   SOCIAL HISTORY:  He lives in [place] with his wife.  He quit smoking  10 months ago, has about approximately 40 to 50-pack-year history of  smoking.  His wife continues to smoke a pack and one-half of cigarettes  on a daily basis.  No alcohol use, no illicit drug use.   FAMILY HISTORY:  Positive for arthritis.   REVIEW OF SYSTEMS:  GENERAL:  Positive for weakness.  HEENT:  Unremarkable.  CARDIOVASCULAR:  As in HPI.  RESPIRATORY:  As in HPI.  GI:  Unremarkable.  GU: Unremarkable.  MUSCULOSKELETAL:  Unremarkable.  NEUROLOGIC:  Unremarkable.  PSYCHIATRIC:  Unremarkable.  Other systems  are unremarkable.   PHYSICAL EXAMINATION:  VITAL SIGNS:  Temperature 98.1, blood pressure  154/73, heart rate 97, respiratory rate about 20. Saturation when he  came in was 83% on room air, 88% on 4 liters, and currently about 94% on  4 liters.  GENERAL:  Well-developed, well-nourished white male in no distress,  slightly tachypneic but no distress.  HEENT:  No pallor, no icterus.  Oral mucous membranes moist.  No oral  lesions are noted.  NECK:  Soft and supple.  No thyromegaly appreciated.  LUNGS: Reveal rhonchi bilaterally.  No definite crackles.  A few wheeze  appreciated with diffuse rhonchi.  CARDIOVASCULAR:  S1 and S2 normal, regular.  No murmurs appreciated.  No  S3, S4.  No rubs, no bruits.  Peripheral pulses palpable.  No edema is  present in the extremities.  ABDOMEN:  Soft, nontender,  nondistended.  Bowel sounds present.  No mass  or organomegaly is appreciated.  MUSCULOSKELETAL:  Unremarkable.  NEUROLOGIC:  Alert and oriented x3.  No focal neurological deficits are  present.   LABORATORY DATA:  ABG was done which showed a pH of 7.46, pCO2 38, pO2  55, bicarb 27, saturation 88%.  This was done on 4 liters.  His white  count is 14,000 with 88% neutrophils, hemoglobin 16.8, platelet count  221.  D-dimer 0.22.  BMET shows a glucose of 141, otherwise  unremarkable.  Cardiac markers negative.   He had a chest x-ray which showed just evidence for COPD.   CT of the chest was just read which showed negative for PE.  Lungs were  clear, and chronic inflammatory changes were noted in the endobronchial  system.   He had an EKG done which showed sinus rhythm with a left axis deviation.  Intervals appear to be in the normal range.  No Q waves, nonspecific T  wave changes with no acute ischemic changes noted on this EKG.   ASSESSMENT:  This is a 77 year old Caucasian male with history of  coronary artery disease, hypertension, who was a former smoker but is  exposed to a significant amount of second-hand smoke.  He presents with  worsening shortness of breath since yesterday.  His clinical exam and  imaging studies are mostly consistent with bronchitis and chronic  obstructive pulmonary disease exacerbation at this time.  I do not  suspect any congestive heart failure.  His BNP was normal, and his  clinical exam did not suggest this even though we are aware that he has  an ejection fraction of about 20-25% based on an echocardiogram done  back in February.   PLAN:  1. Possible acute bronchitis versus chronic obstructive pulmonary      disease:  We will admit the patient to the hospital, give him      intensive nebulizer treatments, steroids, antibiotics.  Will      consult Dr. Juanetta Gosling to see if he has anything else to add.  We will      schedule a pulmonary function test  with [facility] in 6 weeks' time as an  outpatient.  I have told him to encourage his wife to quit smoking      as her cigarette smoke is probably harming him quite a bit,      especially considering his history of CAD.  2. History of coronary artery disease:  This appears to be sable.  No      chest pain.  Will continue aspirin, Plavix, and beta blockers for      now.  He does not have significant wheezing, so I would hesitate      discontinuing the beta blocker at this time.  I wonder if Plavix      can be discontinued as it is more than 6 months since his stent was      placed, but I will defer to cardiology as outpatient to decide      this.  He has an appointment with Dr. Domingo Sep on January 5.  We      will go ahead and give  him 1 dose of Lasix for now.  3. Hypertension appears to be stable.  4. Other medical issues appear to be stable.   Further management decisions will depend on results of initial testing  and patient's response to treatment.      Osvaldo Shipper, MD  Electronically Signed     GK/MEDQ  D:  10/16/2007  T:  10/16/2007  Job:  [id number]   cc:   Patrica Duel, M.D.  Fax: [phone]   Dani Gobble, MD  Fax: [phone]   Oneal Deputy. Juanetta Gosling, M.D.  Fax: [phone]

## 2011-03-09 NOTE — Cardiovascular Report (Signed)
NAME:  Karl Walker, Karl Walker NO.:  [id number]   MEDICAL RECORD NO.:  [id number]          PATIENT TYPE:  INP   LOCATION:  2807                         FACILITY:  [facility]   PHYSICIAN:  Madaline Savage, M.D.DATE OF BIRTH:  19-Dec-1931   DATE OF PROCEDURE:  12/17/2006  DATE OF DISCHARGE:                            CARDIAC CATHETERIZATION   PROCEDURES PERFORMED:  1. Selective coronary angiography by Judkins technique.  2. Retrograde left heart catheterization.  3. Left ventricular angiography.  4. Right heart catheterization.  5. Percutaneous coronary intervention of the right coronary artery,      proximal right coronary artery.   COMPLICATIONS:  None.   PATIENT PROFILE:  Karl Walker entered the hospital on December 13, 2006  with COPD, pneumonia and prostate trouble and was said to have a non-Q-  wave myocardial infarction by cardiac enzymes, with a CK-MB of 12.8,  relative index of 1.3 and a troponin-I of 1.37.  He has been treated for  his respiratory problems and is now a candidate for cardiac cath which  was completed today without complications.   RESULTS:  PRESSURES:  Right atrial mean pressure was 8, right  ventricular pressure 30/1, end-diastolic pressure 6, pulmonary artery  pressure 32/10, mean of 18.  Pulmonary capillary wedge mean pressure 13,  A-wave 20, V-wave 17, left ventricular pressure 135/0 and diastolic  pressure 16, central aortic pressure 135/65, mean of 95.  Fick cardiac  output 4.6, Fick cardiac index 2.3, Thermodilution cardiac output 5.6,  Thermodilution cardiac index 2.8.   ANGIOGRAPHIC RESULTS:  The LAD coursed the cardiac apex giving rise to a  large diagonal branch.  The LAD midway down, near the diagonal takeoff  was 40% narrowed, as was the ostium of that first diagonal branch 40% as  well.  There was a medium-size intermediate ramus branch which was  normal.  There was a left circumflex coronary artery that was  nondominant and normal.   There was a dominant right coronary artery that  bifurcated distally and showed a 75% stenosis proximally.  No left  ventricular angiogram was performed.   PERCUTANEOUS INTERVENTION:  This was performed by right percutaneous  femoral approach with a 6-French guiding catheter, which was a side hole  Judkins' FR6. The guidewire was a Designer, industrial/product.  No pre-dilatation was  done.  There was direct stenting of the 75% stenosis with a 3.5 x 18-mm  Cipher stent.  I deployed the balloon to 14 atmospheres of pressure, and  I post-dilated the deployed stent with a Quantum Maverick 3.75 x 15, and  I did a distal stent dilatation and a proximal stent dilatation to make  sure the stent was widely deployed, and I was pleased with the result.  The concentric stenosis of 75% was reduced to 0% residual and TIMI III  distal flow was preserved pre and post procedure.  No complications  occurred.   Anticoagulation was accomplished with Heparin which brought an ACT of  247.  The patient also received concurrent Integralin and will receive  that for 18 hours following the procedure.   FINAL DIAGNOSES:  1. Single-vessel coronary artery disease of the proximal RCA 75%.  2. Successful direct stenting of the proximal RCA 75% to zero.  3. Mildly elevated pulmonary artery pressures.  4. Low normal cardiac outputs.   PLAN:  The patient will receive Integralin for another 16 to 18 hours.  He will continue getting his loading doses of Plavix today and tonight  and will be on Plavix for the next 2 years.           ______________________________  Madaline Savage, M.D.     WHG/MEDQ  D:  12/18/2006  T:  12/18/2006  Job:  [id number]   cc:   Assunta Found, M.D.  Dani Gobble, MD  [facility]  [facility]

## 2011-03-09 NOTE — H&P (Signed)
NAME:  Karl, Walker                ACCOUNT NO.:  [id number]   MEDICAL RECORD NO.:  [id number]          PATIENT TYPE:  INP   LOCATION:  A220                          FACILITY:  [facility]   PHYSICIAN:  Corrie Mckusick, M.D.  DATE OF BIRTH:  [birth year]   DATE OF ADMISSION:  12/13/2006  DATE OF DISCHARGE:  LH                              HISTORY & PHYSICAL   ADMITTING DIAGNOSES:  1. Chronic obstructive pulmonary disease exacerbation.  2. Bronchitis.   HISTORY OF PRESENTING ILLNESS:  This is a 77 year old gentleman with  longstanding history of COPD and hypertension, who presents with 2 days  of increasing cough, productive yellow sputum and shortness of breath.  He came into the emergency department and found to be hypoxic, in the  lower 80s, high 70s, with some tachypnea.  Nebulizer treatments did seem  to improve.  He had loads of upper rhonchi but really no rales  appreciated on exam in the emergency department.  His initial workup did  not show this to be congestive heart failure.  The chest x-ray was read  as bronchitis and COPD and showed no signs of heart failure.  His BNP  was normal as well.  He has had a longstanding history of pulmonary  fibrosis and COPD and continues to smoke.  He does report that he seems  to have bronchitis about once to twice a year.  He is followed by Dr.  Nobie Putnam.  Cardiovascular symptoms are otherwise negative.  He reports  no PND, orthopnea, reports no left-sided pain.  GI, GU, review of  systems otherwise negative.   PAST MEDICAL HISTORY:  COPD, hypertension.   PAST SURGICAL HISTORY:  Inguinal hernia repair in 2003.   MEDICATIONS:  1. Benicar 40, 12.5 q. day.  2. Avodart q. day.   ALLERGIES:  ANTIHISTAMINES AND IODINE.   SOCIAL HISTORY:  Still continues to smoke a pack a day, almost all of  his life.  Does not drink nor smoke. Lives with his wife.   FAMILY HISTORY:  Really noncontributory.   PHYSICAL EXAMINATION:  VITAL SIGNS:  Temp 98.2,  pulse is 110,  respirations are 24, blood pressure is 135/80, O2 SAT 82% on 2 liters.  Weight 81 kilos.  GENERAL:  When I saw [name], he was in somewhat distress.  He was still  quite talkative and joking but slightly ashen gray color.  HEENT:  Nasopharynx clear with moist mucous membranes.  NECK:  Supple.  No JVD.  CHEST:  There is loads of diffuse rhonchi heard throughout.  There is  really no consolidation.  It seems to be really upper airway.  It is  both inspiratory and expiratory wheezes heard throughout.  There is no  rales.  CARDIOVASCULAR:  Regular rate and rhythm with a soft S1, S2, really  mapped by his pulmonary sounds.  ABDOMEN:  Soft, nontender.  EXTREMITIES:  There is some mild cyanosis.  No clubbing, no erythema.   LABORATORY:  COPD, no active disease.  Initial blood gases 7.4, PCO2 of  45, PO2 of 56, bicarb 27, white count 11.9, hematocrit of  52.6,  platelets of 212, D-dimer of 0.22, sodium 141, potassium 3.6, chloride  102, bicarb 31, glucose 119, BUN 17, creatinine 1, cardiac markers  initially negative.  BNP less than 30.   ASSESSMENT/PLAN:  This is a 77 year old gentleman with a longstanding  history of chronic obstructive pulmonary disease  and hypertension who  presents with chronic obstructive pulmonary disease (COPD) exacerbation  and bronchitis.   PLAN:  1. Admit to 2A for close monitoring.  We are going to go ahead and      check cardiac enzymes as well and serially for q.8 hours.  2. Broad coverage with antibiotic, which is Avelox 400 mg IV q. day.  3. We initially were going to start him on prednisone that was given      in the emergency department, but I have now decided to switch him      to Solu-Medrol, 125 q.6 h.  4. Xopenex/Atrovent nebs q.i.d.  5. Albuterol nebs q.3 h. p.r.n.  6. Give get him on BiPAP to get the oxygenation up somewhat.  I hope      that we can keep him from needing intubation.  7. We are going to get Dr. Juanetta Gosling to see the  patient before he heads      out of town, to get his opinion.  If the status seems to get worse,      we will consider transfer to [facility].  Otherwise, will continue      his Benicar as scheduled.      Corrie Mckusick, M.D.  Electronically Signed     JCG/MEDQ  D:  12/13/2006  T:  12/13/2006  Job:  [id number]

## 2011-03-09 NOTE — Procedures (Signed)
NAME:  Karl Walker, Karl Walker                ACCOUNT NO.:  [id number]   MEDICAL RECORD NO.:  [id number]          PATIENT TYPE:  INP   LOCATION:  A220                          FACILITY:  [facility]   PHYSICIAN:  Dani Gobble, MD       DATE OF BIRTH:  August 28, 1932   DATE OF PROCEDURE:  12/13/2006  DATE OF DISCHARGE:                                ECHOCARDIOGRAM   INDICATIONS:  A 77 year old gentleman with a past medical history of  hypertension, admitted with shortness of breath.   The technical part of the study is somewhat limited secondary to patient  body habitus, the presence of sinus tachycardia, and poor acoustic  windows.   The aorta measures normally at 3.3 cm.   The left atrium also measures normally at 2.7 cm.  The patient appeared  to be in sinus tachycardia during the procedure.   The intraventricular septum and posterior wall appeared mild to  moderately thickened.   The aortic valve appeared mildly thickened but with reasonable leaflet  excursion.  No significant aortic insufficiency is noted.  Doppler  interrogation of the aortic valve reveals peak velocity 1.1 meters per  second, corresponding to a peak gradient of 5 mmHg and a mean gradient  of 4 mmHg.   The mitral valve appears grossly structurally normal.  No mitral valve  prolapse is noted.  Mitral regurgitation is probably present but unable  to quantify on this study.   The pulmonic valve is not well visualized.   The tricuspid valve appears grossly structurally normal with tricuspid  regurgitation noted but again unable to quantify on this study.   The left ventricle is normal in size with LVIDD measured at 4.5 cm and  LVISD measured at 3.5 cm.  Overall left systolic function is markedly  diminished and estimated at 20% to 30%.  There is anteroseptal mid to  distal akinesis.  There is otherwise global hypokinesis.  There is a  distal anteroseptal/apical layered mural thrombus.   The right atrium appears grossly  normal in size as does the right  ventricle.  Right ventricular systolic function appears hyperdynamic  with mild RVH.   There is a small anterior echo-free space suggestive of a small  pericardial effusion without evidence of hemodynamic compromise.   IMPRESSION:  1. Technically difficult study secondary to patient body habitus, the      presence of sinus tachycardia, and poor acoustic windows.  2. Mild left ventricular hypertrophy.  3. Mild aortic sclerosis without stenosis.  4. Some element of mitral and tricuspid regurgitation are likely      present but unable to quantify  5. Normal left ventricular size with markedly diminished ejection      fraction estimated at 20% to 25% with regional wall motion      abnormalities as outlined above.  6. Distal anteroseptal/apical mural layered thrombus.  7. The right ventricle is normal in size with hyperdynamic      contractility and mild right ventricular hypertrophy.  8. Small anterior echo-free space most suggestive of a small      pericardial effusion without  evidence of hemodynamic compromise.  9. Consider repeat echocardiogram for enhanced visualization of the      endocardium and cardiac structures once the heart rate has      diminished.           ______________________________  Dani Gobble, MD     AB/MEDQ  D:  12/13/2006  T:  12/13/2006  Job:  [id number]   cc:   Corrie Mckusick, M.D.  Fax: [phone]

## 2011-03-09 NOTE — Consult Note (Signed)
NAME:  Karl Walker, Karl Walker                ACCOUNT NO.:  [id number]   MEDICAL RECORD NO.:  [id number]          PATIENT TYPE:  INP   LOCATION:  A220                          FACILITY:  [facility]   PHYSICIAN:  Edward L. Juanetta Gosling, M.D.DATE OF BIRTH:  11-09-1931   DATE OF CONSULTATION:  12/13/2006  DATE OF DISCHARGE:                                 CONSULTATION   INTERNAL MEDICINE/PULMONARY CONSULTATION   REFERRING PHYSICIAN:  Corrie Walker, M.D.   SUBJECTIVE:  Karl Walker is a patient of Dr. Lamar Walker who presented to  the emergency room with shortness of breath.  He said it started on the  day of admission.  He eventually came to the emergency room because of  cough and congestion.  He has previous history of pneumonia in the past.  He is not sure if he had any fever but he just felt very short of  breath.  He coughed up a little bit of sputum but has had a difficult  time with that.  Has had increasing shortness of breath, presented to  the emergency room and was started on BiPAP.  BiPAP has improved his  situation.  He says he feels better, but he is still markedly short of  breath and still has a lot of congestion in his chest.  Consultation is  requested for help with management.   PAST MEDICAL HISTORY:  His past medical history is positive for  hypertension, multiple bouts of pneumonia, BPH.   FAMILY HISTORY:  History of coronary disease, hypertension.   PAST SURGICAL HISTORY:  Surgically he has had a hernia repair.   SOCIAL HISTORY:  Apparently he has a smoking history.  He is on the  BiPAP and I have a hard time understanding how much he smokes but he  says a pack a day.  He does not use any alcohol and does not use any  other drugs.   MEDICATIONS:  Medications here in the hospital include Atrovent, Xopenex  by inhaler, Avelox, prednisone and Avapro. It is not clear what his  medications at home were.   ALLERGIES:  He is allergic to IODINE.   REVIEW OF SYSTEMS:  His review of  systems is otherwise negative.   PHYSICAL EXAMINATION:  VITAL SIGNS:  Physical examination shows he is on  Bi-PAP.  His temperature is 96.8, pulse 125, respirations 26, blood  pressure 135/80, O2 sat 100% on current settings.  Height 71 inches,  weight 81.7.   LABORATORY DATA:  His lab work, CBC white count 11,900, hemoglobin 17.7,  platelets 212,000.  BMET essentially normal.  BNP less than 30, D-dimer  0.22.  Blood gas pO2 55, pCO2 of 45, pH 7.39;  This was on room air.  Cardiac markers negative and chest x-ray COPD.   ASSESSMENT AND PLANS:  Despite the lack of findings on his chest x-ray,  I think he probably has pneumonia.  He certainly has fairly marked  bilateral rhonchi and wheezes but he looks pretty comfortable right now.  He is on BiPAP and will need to watch him very carefully and plan to  continue with his medications for now.  I do not plan to change this and  I think I would continue with his prednisone. Since he has fairly  diffuse findings, I might put him on IV steroids rather than the  prednisone.  He is on nebulizer treatments already.  He is on  antibiotics already and I think Avelox is certainly appropriate until we  get a culture back.  Hopefully his BiPAP can be weaned later.      Edward L. Juanetta Gosling, M.D.  Electronically Signed     ELH/MEDQ  D:  12/13/2006  T:  12/13/2006  Job:  [id number]

## 2011-03-09 NOTE — Op Note (Signed)
[facility]  Patient:    Karl Walker, Karl Walker Visit Number: [id number] MRN: [id number]          Service Type: DSU Location: DAY Attending Physician:  Barbaraann Barthel Dictated by:   Barbaraann Barthel, M.D. Proc. Date: 01/21/02   CC:         Patrica Duel, M.D.  Dr. Caralee Ates, [facility]   Operative Report  PREOPERATIVE DIAGNOSIS:  Right inguinal hernia.  POSTOPERATIVE DIAGNOSIS:  Right inguinal hernia (direct and indirect)  OPERATION:  Right inguinal herniorrhaphy, modified McVey repair.  SURGEON:  Barbaraann Barthel, M.D.  NOTE:  This is a 77 year old white male who had a groin mass consistent with a non-incarcerated right inguinal hernia.  We had made plans for elective repair after discussing the risks including bleeding, infection, and recurrence with the patient as well as the possibility that the patient may have urinary retention as at times he does suffer intermittently from prostatism.  Informed consent was obtained.  GROSS OPERATIVE FINDINGS:  The patient had a small indirect sac and a weakness of the inguinal floor with small direct defect.  I did not feel that this needed to be plugged or that mesh was needed in this procedure.  TECHNIQUE:  The patient was placed in the supine position after the adequate administration of spinal anesthesia.  Prior to this, a Foley catheter was aseptically inserted.  The abdomen was prepped with Hibiclens solution, and an incision was carried out between the anterior superior iliac spine and the pubic tubercle.  The skin, subcutaneous tissue, and Scarpas layer were opened down to the external oblique which was opened through the external ring and preserving out of harms way the ilioinguinal nerve.  The cord structures were dissected free from the indirect hernia sac, and the hernia sac was opened, and then under direct vision doubly suture ligated with 2-0 Bralon.  The redundant portion of sac was amputated  and sent as a specimen.  There was a large amount of properitoneal fat forming a "lipoma of the cord," and this was clamped and ligated with 2-0 silk and sent also as a specimen.  I then sutured transversus abdominus and transversalis fascia to Pouparts ligament and Coopers ligament with interrupted 2-0 Bralon sutures.  Prior to cinching this tightly, a relaxing incision was carried out.  I then placed about 6 or 7 cc of 0.5% Sensorcaine around the area of the fascia to help with postoperative comfort.  After checking for hemostasis, the wound was irrigated with normal saline solution.  The cord structures were returned to their anatomical positions along with the ilioinguinal nerve, and the external oblique was repaired with a running 3-0 polysorb suture.  Subcutaneous tissue was irrigated, and the skin was approximated with a stapling device.  Prior to closure, all sponge, needle, and instrument counts were found to be correct. Estimated blood loss was minimal.  The patient tolerated the procedure well. There were no drains placed.  There were no complications.  The patient received 1000 cc of crystalloid intraoperatively. Dictated by:   Barbaraann Barthel, M.D. Attending Physician:  Barbaraann Barthel DD:  01/21/02 TD:  01/22/02 Job: [id number]

## 2011-03-09 NOTE — Discharge Summary (Signed)
NAME:  Karl Walker, Karl Walker                ACCOUNT NO.:  [id number]   MEDICAL RECORD NO.:  [id number]          PATIENT TYPE:  INP   LOCATION:  6527                         FACILITY:  [facility]   PHYSICIAN:  Abelino Derrick, P.A.   DATE OF BIRTH:  1932/07/13   DATE OF ADMISSION:  12/13/2006  DATE OF DISCHARGE:                               DISCHARGE SUMMARY   ADDENDUM:  After [name]'s Foley catheter was pulled on the 28th, he  initially was able to void well but later had urinary retention.  Apparently this has happened to him before.  He has used catheters at  home.  The last time he had a problem was a couple of years ago.  He  sees Dr. Gaynelle Arabian.  We have placed a Foley again, and he had 600 mL  of residual urine.  This morning, will try to see if he can void without  the Foley.  If not, he will need to be discharged with the Foley and  follow up with Dr. Earlene Plater.      Abelino Derrick, P.ALenard Lance  D:  12/20/2006  T:  12/20/2006  Job:  [id number]   cc:   Windy Fast L. Earlene Plater, M.D.

## 2011-03-09 NOTE — Discharge Summary (Signed)
NAME:  Karl Walker, Karl Walker                ACCOUNT NO.:  [id number]   MEDICAL RECORD NO.:  [id number]          PATIENT TYPE:  INP   LOCATION:  6527                         FACILITY:  [facility]   PHYSICIAN:  Dani Gobble, MD       DATE OF BIRTH:  1932-01-19   DATE OF ADMISSION:  12/13/2006  DATE OF DISCHARGE:  12/19/2006                               DISCHARGE SUMMARY   DISCHARGE DIAGNOSES:  1. ST myocardial infarction this admission, treated with elective      right coronary artery CYPHER stenting on December 18, 2006.  2. Left ventricular dysfunction with an ejection fraction of 20% to      25%, out of proportion to his coronary disease.  3. Congestive heart failure, improved at discharge.  4. Chronic obstructive pulmonary disease and bronchitis, improved at      discharge.   HOSPITAL COURSE:  The patient is a 77 year old male who was seen by Dr.  Domingo Sep in [place], December 13, 2006.  He has a history of  hypertension.  He was admitted with dyspnea.  Enzymes were positive with  a CK of 322 with 16 MBs.  Echocardiogram showed an EF of 20-25%.  The  patient was treated for heart failure and bronchitis.  He was started on  antibiotics and steroids, as well as diuretics.  Ultimately, he was felt  to be stable for catheterization, which was done December 17, 2006 by  Dr. Elsie [place].  This revealed a 40% diagonal, 40% LAD, normal circumflex,  normal ramus intermedius and a 75% proximal RCA.  This was dilated and  stented with a CYPHER stent.  An LV gram was not done.  We feel the  patient can be discharged December 19, 2006.  Dr. Jacinto Halim feels he can  home off steroids for now.   DISCHARGE MEDICATIONS:  1. Coated aspirin 325 mg a day.  2. Plavix 75 mg a day.  3. Lanoxin 0.25 mg a day.  4. Toprol-XL 25 mg a day.  5. Benicar/hydrochlorothiazide 20/12.5 daily.  6. Avalox 400 mg a day for 4 days.  7. Nitroglycerin sublingual p.r.n.  8. Prilosec over-the-counter once a day.  9. Avodart 0.5 mg once  a day.  10.Combivent inhaler 2 puffs twice a day.  11.Simvastatin 40 mg a day.   LABS:  White count 8.4, hemoglobin 14.1, hematocrit 41.3, platelets 158.  Sodium 141, potassium 3.4, BUN 14, creatinine 1.0.  CKs 322 with 16 MBs.  TSH 0.25, this will need to be repeated as an outpatient.  D-dimer is  0.22.  BNP is less than 30.  Chest x-ray showed COPD.  EKG shows sinus  rhythm with low voltage, inferior Qs, T-wave inversion V2 through V6.   DISPOSITION:  The patient discharged in stable condition.   FOLLOWUP:  With Dr. Domingo Sep in a week or so.  He will need a followup  TSH as an outpatient.      Abelino Derrick, P.A.    ______________________________  Dani Gobble, MD    LKK/MEDQ  D:  12/19/2006  T:  12/19/2006  Job:  [id number]  cc:   Corrie Mckusick, M.D.

## 2011-05-11 ENCOUNTER — Encounter: Payer: Self-pay | Admitting: Surgery

## 2011-06-04 ENCOUNTER — Ambulatory Visit: Payer: Self-pay | Admitting: Surgery

## 2011-07-13 ENCOUNTER — Encounter: Payer: Self-pay | Admitting: Surgery

## 2011-07-16 ENCOUNTER — Ambulatory Visit (INDEPENDENT_AMBULATORY_CARE_PROVIDER_SITE_OTHER): Payer: Medicare HMO | Admitting: Surgery

## 2011-07-16 ENCOUNTER — Encounter: Payer: Self-pay | Admitting: Surgery

## 2011-07-16 ENCOUNTER — Encounter (INDEPENDENT_AMBULATORY_CARE_PROVIDER_SITE_OTHER): Payer: Medicare HMO | Admitting: *Deleted

## 2011-07-16 VITALS — BP 113/77 | HR 72 | Resp 16 | Ht 70.0 in | Wt 143.5 lb

## 2011-07-16 DIAGNOSIS — I714 Abdominal aortic aneurysm, without rupture, unspecified: Secondary | ICD-10-CM | POA: Insufficient documentation

## 2011-07-16 DIAGNOSIS — I70219 Atherosclerosis of native arteries of extremities with intermittent claudication, unspecified extremity: Secondary | ICD-10-CM

## 2011-07-16 NOTE — Progress Notes (Signed)
Addended by: Sharee Pimple on: 07/16/2011 01:20 PM   Modules accepted: Orders

## 2011-07-16 NOTE — Progress Notes (Signed)
[facility]   Patient name: Karl Walker MRN: [id number] DOB: 1932-05-11 Sex: male     Chief Complaint  Patient presents with  . AAA    1 year follow up    HISTORY OF PRESENT ILLNESS: This is a 77 year old gentleman who is back today for followup of an abdominal aortic aneurysm. I initially met him approximately year ago in the emergency department when he was complaining of right flank and hip pain he underwent a CT scan which showed a 4 x 3.5 cm infrarenal abdominal aortic aneurysm. The patient's risk factors include hypertension and hypercholesterolemia both of which are medically managed. His blood pressure is an open systolic 130s. He has a history of a heart attack in 2007 which was treated with a stent. He continues to suffer from COPD but does not require oxygen. He continues to smoke. Approximately 2 cigarettes per day he denies having any abdominal pain. His back pain is completely resolved. In the interval since I last seen him he has undergone multiple resections of basal cell cancers on his head.  Past Medical History  Diagnosis Date  . Urinary frequency   . Shortness of breath   . Bronchitis   . Hypertension   . COPD (chronic obstructive pulmonary disease)   . Hypercholesterolemia   . MI (myocardial infarction) history of MI in 2007    Past Surgical History  Procedure Date  . Hernia repair   . Heart stint     History   Social History  . Marital Status: Married    Spouse Name: N/A    Number of Children: N/A  . Years of Education: N/A   Occupational History  . Not on file.   Social History Main Topics  . Smoking status: Current Some Day Smoker -- 0.1 packs/day    Types: Cigarettes  . Smokeless tobacco: Never Used  . Alcohol Use: Yes     rare  . Drug Use: No  . Sexually Active: Not on file   Other Topics Concern  . Not on file   Social History Narrative  . No narrative on file    No family history on  file.  Allergies as of 07/16/2011 - Review Complete 07/16/2011  Allergen Reaction Noted  . Iodine      Current Outpatient Prescriptions on File Prior to Visit  Medication Sig Dispense Refill  . aspirin 81 MG tablet Take 81 mg by mouth daily.        . Cholecalciferol (VITAMIN D PO) Take by mouth.        . Dutasteride (AVODART PO) Take by mouth.        . fish oil-omega-3 fatty acids 1000 MG capsule Take 2 g by mouth daily.        . Niacin, Antihyperlipidemic, (NIASPAN PO) Take by mouth.        Marland Kitchen SIMVASTATIN PO Take 40 mg by mouth 1 day or 1 dose.       Marland Kitchen HYDROcodone-acetaminophen (NORCO) 5-325 MG per tablet Take 1 tablet by mouth 4 (four) times daily.        . Olmesartan Medoxomil (BENICAR PO) Take by mouth.          REVIEW OF SYSTEMS: Positive for shortness of breath with exertion bronchitis or frequent urination although his systems are negative as documented in the encounter form.  PHYSICAL EXAMINATION: General: The patient appears their stated age.  Vital signs are BP 113/77  Pulse 72  Resp  16  Ht 5\' 10"  (1.778 m)  Wt 143 lb 8 oz (65.091 kg)  BMI 20.59 kg/m2 Pulmonary: There is a good air exchange bilaterally without wheezing or rales. Abdomen: Soft and non-tender with normal pitch bowel sounds. Aortic aneurysm is easily palpable and is nontender Musculoskeletal: There are no major deformities.  There is no significant extremity pain. Neurologic: No focal weakness or paresthesias are detected, Skin: There are no ulcer or rashes noted. Psychiatric: The patient has normal affect. Cardiovascular: There is a regular rate and rhythm without significant murmur appreciated. No carotid bruits palpable dorsalis pedis pulse and popliteal pulse bilaterally   Diagnostic Studies Ultrasound was performed today which shows no change in his aneurysm maximum diameter is 4 cm  Assessment: Abdominal aortic aneurysm Plan: I will plan on seeing the patient back in 6 months with a repeat  ultrasound. I'm also get a duplex of his lower extremities he has prominent popliteal pulse I want to rule out popliteal aneurysms.  Jorge Ny, M.D. [facility] [place] Office: [phone]

## 2011-07-23 NOTE — Procedures (Unsigned)
DUPLEX ULTRASOUND OF ABDOMINAL AORTA  INDICATION:  Abdominal aortic aneurysm.  HISTORY: Diabetes:  No. Cardiac:  MI, stents. Hypertension:  Yes. Smoking:  Yes. Connective Tissue Disorder: Family History:  No. Previous Surgery:  No.  DUPLEX EXAM:         AP (cm)                   TRANSVERSE (cm) Proximal             2.6 cm                    2.6 cm Mid                  2.1 cm                    2.3 cm Distal               4.0 cm                    4.0 cm Right Iliac          Not visualized            Not visualized Left Iliac           Not visualized            Not visualized  PREVIOUS:  Date: 04/30/2010 (CT)  AP:  4.0  TRANSVERSE:  3.5  IMPRESSION: 1. Aneurysmal dilatation of the distal abdominal aorta with no     significant change in maximum diameter when compared to the     previous examination. 2. The bilateral common iliac arteries were not adequately visualized     due to overlying bowel gas patterns.  ___________________________________________ V. Charlena Cross, MD  CH/MEDQ  D:  07/16/2011  T:  07/16/2011  Job:  [id number]

## 2011-07-27 LAB — CULTURE, BLOOD (ROUTINE X 2)
Culture: NO GROWTH
Report Status: 12302008
Report Status: 12302008

## 2011-07-27 LAB — POCT CARDIAC MARKERS
CKMB, poc: 1.3
Myoglobin, poc: 164
Operator id: 228551
Troponin i, poc: <0.05
Troponin i, poc: <0.05

## 2011-07-27 LAB — CK TOTAL AND CKMB (NOT AT ARMC)
CK, MB: 2.2
CK, MB: 2.2
Total CK: 93

## 2011-07-27 LAB — BASIC METABOLIC PANEL
BUN: 12
BUN: 23
CO2: 30
Chloride: 102
Chloride: 104
Creatinine, Ser: 1.09
Creatinine, Ser: 1.31
Glucose, Bld: 141 — ABNORMAL HIGH
Glucose, Bld: 184 — ABNORMAL HIGH
Potassium: 4.3

## 2011-07-27 LAB — CBC
HCT: 49.9
MCHC: 33.2
MCHC: 33.7
MCV: 92.5
MCV: 93.8
Platelets: 193
Platelets: 221
RDW: 13.7
WBC: 12.6 — ABNORMAL HIGH
WBC: 14 — ABNORMAL HIGH

## 2011-07-27 LAB — DIFFERENTIAL
Basophils Absolute: 0
Basophils Relative: 0
Basophils Relative: 0
Eosinophils Absolute: 0
Eosinophils Absolute: 0.1
Eosinophils Relative: 0
Eosinophils Relative: 0
Lymphs Abs: 0.5 — ABNORMAL LOW
Lymphs Abs: 0.6 — ABNORMAL LOW
Neutrophils Relative %: 94 — ABNORMAL HIGH

## 2011-07-27 LAB — TSH: TSH: 0.473

## 2011-07-27 LAB — BLOOD GAS, ARTERIAL
Acid-Base Excess: 3.6 — ABNORMAL HIGH
O2 Saturation: 88.2
Patient temperature: 37
TCO2: 22.8

## 2011-07-27 LAB — B-NATRIURETIC PEPTIDE (CONVERTED LAB): Pro B Natriuretic peptide (BNP): 70

## 2011-07-27 LAB — D-DIMER, QUANTITATIVE: D-Dimer, Quant: 0.22

## 2011-07-27 LAB — LIPID PANEL
Cholesterol: 115
LDL Cholesterol: 59

## 2011-08-17 ENCOUNTER — Ambulatory Visit (INDEPENDENT_AMBULATORY_CARE_PROVIDER_SITE_OTHER): Payer: Medicare HMO | Admitting: Urology

## 2011-08-17 DIAGNOSIS — N138 Other obstructive and reflux uropathy: Secondary | ICD-10-CM

## 2011-08-17 DIAGNOSIS — R972 Elevated prostate specific antigen [PSA]: Secondary | ICD-10-CM

## 2011-08-17 DIAGNOSIS — N529 Male erectile dysfunction, unspecified: Secondary | ICD-10-CM

## 2011-10-23 HISTORY — PX: TUMOR REMOVAL: SHX12

## 2011-11-26 ENCOUNTER — Emergency Department (HOSPITAL_COMMUNITY): Payer: Medicare HMO

## 2011-11-26 ENCOUNTER — Other Ambulatory Visit: Payer: Self-pay

## 2011-11-26 ENCOUNTER — Emergency Department (HOSPITAL_COMMUNITY)
Admission: EM | Admit: 2011-11-26 | Discharge: 2011-11-26 | Disposition: A | Discharge status: Home / Self Care | Payer: Medicare HMO | Attending: Emergency Medicine | Admitting: Emergency Medicine

## 2011-11-26 ENCOUNTER — Encounter (HOSPITAL_COMMUNITY): Payer: Self-pay

## 2011-11-26 DIAGNOSIS — Z7982 Long term (current) use of aspirin: Secondary | ICD-10-CM | POA: Insufficient documentation

## 2011-11-26 DIAGNOSIS — I252 Old myocardial infarction: Secondary | ICD-10-CM | POA: Insufficient documentation

## 2011-11-26 DIAGNOSIS — I452 Bifascicular block: Secondary | ICD-10-CM | POA: Insufficient documentation

## 2011-11-26 DIAGNOSIS — I714 Abdominal aortic aneurysm, without rupture, unspecified: Secondary | ICD-10-CM | POA: Insufficient documentation

## 2011-11-26 DIAGNOSIS — F17200 Nicotine dependence, unspecified, uncomplicated: Secondary | ICD-10-CM | POA: Insufficient documentation

## 2011-11-26 DIAGNOSIS — J449 Chronic obstructive pulmonary disease, unspecified: Secondary | ICD-10-CM | POA: Insufficient documentation

## 2011-11-26 DIAGNOSIS — J4489 Other specified chronic obstructive pulmonary disease: Secondary | ICD-10-CM | POA: Insufficient documentation

## 2011-11-26 DIAGNOSIS — I10 Essential (primary) hypertension: Secondary | ICD-10-CM | POA: Insufficient documentation

## 2011-11-26 DIAGNOSIS — E7800 Pure hypercholesterolemia, unspecified: Secondary | ICD-10-CM | POA: Insufficient documentation

## 2011-11-26 DIAGNOSIS — R109 Unspecified abdominal pain: Secondary | ICD-10-CM | POA: Insufficient documentation

## 2011-11-26 DIAGNOSIS — N201 Calculus of ureter: Secondary | ICD-10-CM | POA: Insufficient documentation

## 2011-11-26 LAB — DIFFERENTIAL
Basophils Relative: 0 % (ref 0–1)
Lymphocytes Relative: 6 % — ABNORMAL LOW (ref 12–46)
Monocytes Absolute: 0.8 10*3/uL (ref 0.1–1.0)
Monocytes Relative: 7 % (ref 3–12)
Neutro Abs: 9.6 10*3/uL — ABNORMAL HIGH (ref 1.7–7.7)
Neutrophils Relative %: 87 % — ABNORMAL HIGH (ref 43–77)

## 2011-11-26 LAB — POCT I-STAT, CHEM 8
HCT: 55 % — ABNORMAL HIGH (ref 39.0–52.0)
Hemoglobin: 18.7 g/dL — ABNORMAL HIGH (ref 13.0–17.0)
Potassium: 4.2 mEq/L (ref 3.5–5.1)
Sodium: 143 mEq/L (ref 135–145)
TCO2: 29 mmol/L (ref 0–100)

## 2011-11-26 LAB — URINE MICROSCOPIC-ADD ON

## 2011-11-26 LAB — COMPREHENSIVE METABOLIC PANEL
AST: 15 U/L (ref 0–37)
Albumin: 3.8 g/dL (ref 3.5–5.2)
Alkaline Phosphatase: 71 U/L (ref 39–117)
BUN: 20 mg/dL (ref 6–23)
CO2: 30 mEq/L (ref 19–32)
Chloride: 103 mEq/L (ref 96–112)
Creatinine, Ser: 1.16 mg/dL (ref 0.50–1.35)
GFR calc non Af Amer: 59 mL/min — ABNORMAL LOW (ref >90)
Potassium: 4.2 mEq/L (ref 3.5–5.1)
Total Bilirubin: 0.6 mg/dL (ref 0.3–1.2)

## 2011-11-26 LAB — URINALYSIS, ROUTINE W REFLEX MICROSCOPIC
Glucose, UA: NEGATIVE mg/dL
Ketones, ur: NEGATIVE mg/dL
Protein, ur: 30 mg/dL — AB
Urobilinogen, UA: 0.2 mg/dL (ref 0.0–1.0)

## 2011-11-26 LAB — CBC
HCT: 51 % (ref 39.0–52.0)
Hemoglobin: 16.6 g/dL (ref 13.0–17.0)
RBC: 5.49 MIL/uL (ref 4.22–5.81)
WBC: 11.1 10*3/uL — ABNORMAL HIGH (ref 4.0–10.5)

## 2011-11-26 MED ORDER — HYDROMORPHONE HCL PF 1 MG/ML IJ SOLN
1.0000 mg | Freq: Once | INTRAMUSCULAR | Status: AC
  Administered 2011-11-26: 1 mg via INTRAVENOUS
  Filled 2011-11-26: qty 1

## 2011-11-26 MED ORDER — TAMSULOSIN HCL 0.4 MG PO CAPS: ORAL_CAPSULE | ORAL | Status: DC

## 2011-11-26 MED ORDER — SODIUM CHLORIDE 0.9 % IV SOLN
INTRAVENOUS | Status: DC
  Administered 2011-11-26: 12:00:00 via INTRAVENOUS

## 2011-11-26 MED ORDER — ONDANSETRON HCL 4 MG/2ML IJ SOLN
4.0000 mg | Freq: Once | INTRAMUSCULAR | Status: AC
  Administered 2011-11-26: 4 mg via INTRAVENOUS
  Filled 2011-11-26: qty 2

## 2011-11-26 MED ORDER — OXYCODONE-ACETAMINOPHEN 5-325 MG PO TABS: 1.0000 | ORAL_TABLET | ORAL | Status: AC | PRN

## 2011-11-26 NOTE — ED Notes (Signed)
Ab pain that started this am, pain starts midline and radiates to left upper quad, +nausea.

## 2011-11-26 NOTE — ED Provider Notes (Signed)
This chart was scribed for Carleene Cooper III, MD by Williemae Natter. The patient was seen in room [facility]12/[facility]12 at 11:10 AM.  CSN: [id number]  Arrival date & time 11/26/11  1017   First MD Initiated Contact with Patient 11/26/11 1106      Chief Complaint  Patient presents with  . Abdominal Pain    (Consider location/radiation/quality/duration/timing/severity/associated sxs/prior treatment) Patient is a 78 y.o. male presenting with abdominal pain.  Abdominal Pain The primary symptoms of the illness include abdominal pain and nausea. The current episode started 3 to 5 hours ago. The onset of the illness was sudden. The problem has been gradually worsening.  Additional symptoms associated with the illness include back pain.   Pt reports having severe acute onset abdominal pain around 7:30 am this morning. Pain radiates across abdomen and around to back. 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  Past Medical History  Diagnosis Date  . Urinary frequency   . Shortness of breath   . Bronchitis   . Hypertension   . COPD (chronic obstructive pulmonary disease)   . Hypercholesterolemia   . MI (myocardial infarction) history of MI in 2007    Past Surgical History  Procedure Date  . Hernia repair   . Heart stint     No family history on file.  History  Substance Use Topics  . Smoking status: Current Some Day Smoker -- 0.1 packs/day    Types: Cigarettes  . Smokeless tobacco: Never Used  . Alcohol Use: Yes     rare      Review of Systems  Gastrointestinal: Positive for nausea and abdominal pain.  Musculoskeletal: Positive for back pain.   10 Systems reviewed and are negative for acute change except as noted in the HPI.  Allergies  Iodine  Home Medications   Current Outpatient Rx  Name Route Sig Dispense Refill  . ASPIRIN 81 MG PO TABS Oral Take 81 mg by mouth daily.      Marland Kitchen VITAMIN D PO Oral Take by mouth.      . AVODART PO Oral Take by mouth.      . OMEGA-3 FATTY ACIDS 1000  MG PO CAPS Oral Take 2 g by mouth daily.      Marland Kitchen HYDROCODONE-ACETAMINOPHEN 5-325 MG PO TABS Oral Take 1 tablet by mouth 4 (four) times daily.      Marland Kitchen LOSARTAN POTASSIUM-HCTZ 50-12.5 MG PO TABS Oral Take 1 tablet by mouth daily.      Marland Kitchen NIASPAN PO Oral Take by mouth.      . BENICAR PO Oral Take by mouth.     Marland Kitchen SIMVASTATIN PO Oral Take 40 mg by mouth 1 day or 1 dose.       BP 186/90  Pulse 67  Temp(Src) 97.3 F (36.3 C) (Oral)  Resp 22  Ht 5\' 11"  (1.803 m)  Wt 150 lb (68.04 kg)  BMI 20.92 kg/m2  SpO2 96%  Physical Exam  Nursing note and vitals reviewed. Constitutional: He is oriented to person, place, and time. He appears well-developed and well-nourished.  HENT:  Head: Normocephalic and atraumatic.  Neck: Normal range of motion. Neck supple.  Cardiovascular: Normal rate.   Pulmonary/Chest: Effort normal and breath sounds normal.  Neurological: He is alert and oriented to person, place, and time.  Skin: Skin is warm and dry.  Psychiatric: He has a normal mood and affect. His behavior is normal.    ED Course  Procedures (including critical care time) 1412 Recheck: Pt notified  of kidney stone in scans and enlarged aneurysm. Pt is feeling better.  DIAGNOSTIC STUDIES: Oxygen Saturation is 96% on room air, adequate by my interpretation.    COORDINATION OF CARE:  Medications  0.9 %  sodium chloride infusion (  Intravenous New Bag/Given 11/26/11 1133)  HYDROmorphone (DILAUDID) injection 1 mg (1 mg Intravenous Given 11/26/11 1132)  ondansetron (ZOFRAN) injection 4 mg (4 mg Intravenous Given 11/26/11 1132)    11:44 AM  Date: 11/26/2011  Rate: 83  Rhythm: normal sinus rhythm  QRS Axis: left  Intervals: normal  ST/T Wave abnormalities: normal  Conduction Disutrbances:right bundle branch block and left anterior fascicular block  Narrative Interpretation: Abnormal EKG.   Old EKG Reviewed: unchanged  Results for orders placed during the hospital encounter of 11/26/11  CBC       Component Value Range   WBC 11.1 (*) 4.0 - 10.5 (K/uL)   RBC 5.49  4.22 - 5.81 (MIL/uL)   Hemoglobin 16.6  13.0 - 17.0 (g/dL)   HCT 16.1  09.6 - 04.5 (%)   MCV 92.9  78.0 - 100.0 (fL)   MCH 30.2  26.0 - 34.0 (pg)   MCHC 32.5  30.0 - 36.0 (g/dL)   RDW 40.9  81.1 - 91.4 (%)   Platelets 185  150 - 400 (K/uL)  DIFFERENTIAL      Component Value Range   Neutrophils Relative 87 (*) 43 - 77 (%)   Neutro Abs 9.6 (*) 1.7 - 7.7 (K/uL)   Lymphocytes Relative 6 (*) 12 - 46 (%)   Lymphs Abs 0.7  0.7 - 4.0 (K/uL)   Monocytes Relative 7  3 - 12 (%)   Monocytes Absolute 0.8  0.1 - 1.0 (K/uL)   Eosinophils Relative 0  0 - 5 (%)   Eosinophils Absolute 0.0  0.0 - 0.7 (K/uL)   Basophils Relative 0  0 - 1 (%)   Basophils Absolute 0.0  0.0 - 0.1 (K/uL)  COMPREHENSIVE METABOLIC PANEL      Component Value Range   Sodium 142  135 - 145 (mEq/L)   Potassium 4.2  3.5 - 5.1 (mEq/L)   Chloride 103  96 - 112 (mEq/L)   CO2 30  19 - 32 (mEq/L)   Glucose, Bld 157 (*) 70 - 99 (mg/dL)   BUN 20  6 - 23 (mg/dL)   Creatinine, Ser 7.82  0.50 - 1.35 (mg/dL)   Calcium 95.6 (*) 8.4 - 10.5 (mg/dL)   Total Protein 7.2  6.0 - 8.3 (g/dL)   Albumin 3.8  3.5 - 5.2 (g/dL)   AST 15  0 - 37 (U/L)   ALT 9  0 - 53 (U/L)   Alkaline Phosphatase 71  39 - 117 (U/L)   Total Bilirubin 0.6  0.3 - 1.2 (mg/dL)   GFR calc non Af Amer 59 (*) >90 (mL/min)   GFR calc Af Amer 68 (*) >90 (mL/min)  URINALYSIS, ROUTINE W REFLEX MICROSCOPIC      Component Value Range   Color, Urine YELLOW  YELLOW    APPearance CLOUDY (*) CLEAR    Specific Gravity, Urine >1.030 (*) 1.005 - 1.030    pH 6.0  5.0 - 8.0    Glucose, UA NEGATIVE  NEGATIVE (mg/dL)   Hgb urine dipstick MODERATE (*) NEGATIVE    Bilirubin Urine NEGATIVE  NEGATIVE    Ketones, ur NEGATIVE  NEGATIVE (mg/dL)   Protein, ur 30 (*) NEGATIVE (mg/dL)   Urobilinogen, UA 0.2  0.0 - 1.0 (mg/dL)  Nitrite POSITIVE (*) NEGATIVE    Leukocytes, UA MODERATE (*) NEGATIVE   POCT I-STAT, CHEM 8       Component Value Range   Sodium 143  135 - 145 (mEq/L)   Potassium 4.2  3.5 - 5.1 (mEq/L)   Chloride 104  96 - 112 (mEq/L)   BUN 21  6 - 23 (mg/dL)   Creatinine, Ser 9.14  0.50 - 1.35 (mg/dL)   Glucose, Bld 782 (*) 70 - 99 (mg/dL)   Calcium, Ion 9.56  2.13 - 1.32 (mmol/L)   TCO2 29  0 - 100 (mmol/L)   Hemoglobin 18.7 (*) 13.0 - 17.0 (g/dL)   HCT 08.6 (*) 57.8 - 52.0 (%)  URINE MICROSCOPIC-ADD ON      Component Value Range   Squamous Epithelial / LPF RARE  RARE    WBC, UA TOO NUMEROUS TO COUNT  <3 (WBC/hpf)   RBC / HPF 7-10  <3 (RBC/hpf)   Bacteria, UA MANY (*) RARE    Ct Abdomen Pelvis Wo Contrast  11/26/2011  *RADIOLOGY REPORT*  Clinical Data: Back pain, known AAA  CT ABDOMEN AND PELVIS WITHOUT CONTRAST  Technique:  Multidetector CT imaging of the abdomen and pelvis was performed following the standard protocol without intravenous contrast.  Comparison: 04/30/2010  Findings: Emphysematous changes at the lung bases.  Stable hypodense hepatic lesions, likely benign.  Spleen, pancreas, and right adrenal gland are within normal limits.  2.1 x 2.5 cm left adrenal adenoma.  Gallbladder is unremarkable.  No intrahepatic or extrahepatic ductal dilatation.  Mild left hydronephrosis with surrounding perinephric stranding. At least two left and two right nonobstructing renal calculi measuring 2-3 mm.  No evidence of bowel obstruction.  Infrarenal abdominal aortic aneurysm measuring 4.6 x 4.6 cm, previously 3.7 x 3.7 cm.  No surrounding stranding to suggest impending rupture.  Atherosclerotic calcifications of the abdominal aorta and branch vessels.  No suspicious abdominopelvic lymphadenopathy.  No abdominopelvic ascites.  Prostatomegaly, measuring 6.5 cm in transverse dimension and indenting the base of the bladder.  3 mm distal left ureteral calculus at the UVJ (series 2/image 63).  Thick-walled bladder, possibly reflecting bowel obstruction, with suspected anterior bladder diverticulum.  Tiny  fat-containing left inguinal hernia.  Degenerate changes of the visualized thoracolumbar spine.  Grade 1 anterolisthesis of L4 on L5 with bilateral pars defects.  No suspicious osseous lesions.  IMPRESSION: 3 mm distal left ureteral calculus at the UVJ.  Mild left hydronephrosis with perinephric stranding.  Additional tiny bilateral renal calculi.  4.6 cm infrarenal abdominal aortic aneurysm, increased.  Additional stable ancillary findings as above.  Original Report Authenticated By: Charline Bills, M.D.    4:00 PM I reviewed pt's CT x-ray of the abdomen with Dr. Fabienne Bruns, vascular surgeon on call.  He advised that pt had not been seen by them for almost six months.  [facility] office will call pt with an appointment.  Concerning his stone, I advised straining his urine to catch the stone.  He can take Percocet q4h prn pain, and Flomax 0.4 mg qd.  He can followup on the kidney stone with Dr. Jerre Simon, urologist on call.    1. Left ureteral calculus   2. Abdominal aortic aneurysm        I personally performed the services described in this documentation, which was scribed in my presence. The recorded information has been reviewed and considered.  Osvaldo Human, M.D.   Carleene Cooper III, MD 11/27/11 [phone]

## 2011-11-29 LAB — URINE CULTURE
Colony Count: >=100000
Culture  Setup Time: 201302050107

## 2011-11-30 NOTE — ED Notes (Signed)
+   Urine Chart sent to EDP office for review. 

## 2011-12-01 NOTE — ED Notes (Signed)
Chart back from EDP office; Rx Macrobid 100 mg 7 po BID for 7 days per A Micheals

## 2011-12-02 NOTE — ED Notes (Signed)
Rx called in by RN Norm Parcel to [facility] [phone])

## 2011-12-07 ENCOUNTER — Encounter: Payer: Self-pay | Admitting: Surgery

## 2011-12-10 ENCOUNTER — Other Ambulatory Visit: Payer: Self-pay | Admitting: Surgery

## 2011-12-10 ENCOUNTER — Encounter: Payer: Self-pay | Admitting: Surgery

## 2011-12-10 ENCOUNTER — Ambulatory Visit (INDEPENDENT_AMBULATORY_CARE_PROVIDER_SITE_OTHER): Payer: Medicare HMO | Admitting: Surgery

## 2011-12-10 VITALS — BP 128/74 | HR 68 | Resp 16 | Ht 70.5 in | Wt 132.0 lb

## 2011-12-10 DIAGNOSIS — I714 Abdominal aortic aneurysm, without rupture: Secondary | ICD-10-CM

## 2011-12-10 NOTE — Progress Notes (Signed)
[facility]   Patient name: Karl Walker MRN: [id number] DOB: [birth year] Sex: male     Chief Complaint  Patient presents with  . AAA    Increase in size per ED visit on 11-27-11,   Pt reports steady decrease in weight (80 lbs in past 7 years,  18 lbs in the past 2 weeks)     HISTORY OF PRESENT ILLNESS: The patient is back today for followup of his abdominal aortic aneurysm. I initially met him and 2011 when he came to the emergency department complaining of right flank and hip pain. At that time a CT scan showed a 3.5 cm infrarenal abdominal aortic aneurysm. We have been following him with serial studies. He recently went to the emergency department for severe abdominal pain. A noncontrast CT scan was performed which diagnosed him with kidney stones. Incidentally his aneurysm has grown from 4.6 cm. His abdominal pain is nearly resolved. He is here to follow for his increasing sized aneurysm.  The patient is again smoking. He continues to be medically managed for his hypertension and hypercholesterolemia. He has a history of myocardial infarction in 2007 which required coronary stenting.  Past Medical History  Diagnosis Date  . Urinary frequency   . Shortness of breath   . Bronchitis   . Hypertension   . COPD (chronic obstructive pulmonary disease)   . Hypercholesterolemia   . MI (myocardial infarction) history of MI in 2007  . Cancer     basal cell ca on face    Past Surgical History  Procedure Date  . Hernia repair   . Coronary angioplasty with stent placement     History   Social History  . Marital Status: Married    Spouse Name: N/A    Number of Children: N/A  . Years of Education: N/A   Occupational History  . Not on file.   Social History Main Topics  . Smoking status: Current Some Day Smoker -- 0.1 packs/day    Types: Cigarettes  . Smokeless tobacco: Never Used  . Alcohol Use: Yes     rare  . Drug Use: No  . Sexually Active: No     Other Topics Concern  . Not on file   Social History Narrative  . No narrative on file    Family History  Problem Relation Age of Onset  . Stroke Brother     Allergies as of 12/10/2011 - Review Complete 12/10/2011  Allergen Reaction Noted  . Iodine      Current Outpatient Prescriptions on File Prior to Visit  Medication Sig Dispense Refill  . albuterol (PROVENTIL) (2.5 MG/3ML) 0.083% nebulizer solution Take 2.5 mg by nebulization every 6 (six) hours as needed. Congestion      . aspirin 81 MG tablet Take 81 mg by mouth daily.        Marland Kitchen dutasteride (AVODART) 0.5 MG capsule Take 0.5 mg by mouth daily.      . finasteride (PROSCAR) 5 MG tablet Take 5 mg by mouth daily.      Marland Kitchen losartan-hydrochlorothiazide (HYZAAR) 50-12.5 MG per tablet Take 1 tablet by mouth daily.        . Tamsulosin HCl (FLOMAX) 0.4 MG CAPS Take one tablet once a day to facilitate stone passage.  5 capsule  0  . VITAMIN D, ERGOCALCIFEROL, PO Take 1 tablet by mouth daily.         REVIEW OF SYSTEMS: Positive shortness of breath lying flat and  with exertion. Positive for productive cough and wheezing. Although review of systems are negative.  PHYSICAL EXAMINATION:   Vital signs are BP 128/74  Pulse 68  Resp 16  Ht 5' 10.5" (1.791 m)  Wt 132 lb (59.875 kg)  BMI 18.67 kg/m2  SpO2 92% General: The patient appears their stated age. HEENT:  No gross abnormalities Pulmonary:  Non labored breathing Abdomen: Soft and non-tender aorta is easily palpable and nontender Musculoskeletal: There are no major deformities. Neurologic: No focal weakness or paresthesias are detected, Skin: There are no ulcer or rashes noted. Psychiatric: The patient has normal affect. Cardiovascular: Pedal pulses are palpable   Diagnostic Studies I have reviewed his CT scan which was a noncontrasted scan from several weeks ago. His aneurysm has increased to 4.6 cm.  Assessment: Abdominal aortic aneurysm Plan: The patient's aneurysm  has grown progressively over the last 2 years. It now measures 4.6 cm. He remained asymptomatic. Because of the rate at which is growing have elected to bring him back in 6 months. If it has increased at all in size at that time I would recommend repair. I believe he would be a good candidate for endovascular repair.  Jorge Ny, M.D. [facility] [place] Office: [phone] Pager:  [phone]

## 2011-12-10 NOTE — Progress Notes (Signed)
Addended by: Sharee Pimple on: 12/10/2011 02:28 PM   Modules accepted: Orders

## 2012-01-14 ENCOUNTER — Other Ambulatory Visit: Payer: Medicare HMO

## 2012-01-14 ENCOUNTER — Ambulatory Visit: Payer: Medicare HMO | Admitting: Surgery

## 2012-06-04 ENCOUNTER — Other Ambulatory Visit: Payer: Self-pay | Admitting: Surgery

## 2012-06-05 LAB — BUN: BUN: 23 mg/dL (ref 6–23)

## 2012-06-05 LAB — CREATININE, SERUM: Creat: 0.95 mg/dL (ref 0.50–1.35)

## 2012-06-06 ENCOUNTER — Encounter: Payer: Self-pay | Admitting: Surgery

## 2012-06-09 ENCOUNTER — Ambulatory Visit
Admission: RE | Admit: 2012-06-09 | Discharge: 2012-06-09 | Disposition: A | Discharge status: Home / Self Care | Payer: Medicare HMO | Source: Ambulatory Visit | Attending: Surgery | Admitting: Surgery

## 2012-06-09 ENCOUNTER — Other Ambulatory Visit (INDEPENDENT_AMBULATORY_CARE_PROVIDER_SITE_OTHER): Payer: Medicare HMO | Admitting: *Deleted

## 2012-06-09 ENCOUNTER — Encounter: Payer: Self-pay | Admitting: Surgery

## 2012-06-09 ENCOUNTER — Ambulatory Visit (INDEPENDENT_AMBULATORY_CARE_PROVIDER_SITE_OTHER): Payer: Medicare HMO | Admitting: Surgery

## 2012-06-09 VITALS — BP 134/82 | HR 96 | Resp 16 | Ht 70.5 in | Wt 142.0 lb

## 2012-06-09 DIAGNOSIS — I714 Abdominal aortic aneurysm, without rupture: Secondary | ICD-10-CM

## 2012-06-09 DIAGNOSIS — R222 Localized swelling, mass and lump, trunk: Secondary | ICD-10-CM

## 2012-06-09 DIAGNOSIS — R918 Other nonspecific abnormal finding of lung field: Secondary | ICD-10-CM

## 2012-06-09 DIAGNOSIS — Z01810 Encounter for preprocedural cardiovascular examination: Secondary | ICD-10-CM

## 2012-06-09 MED ORDER — IOHEXOL 350 MG/ML SOLN
100.0000 mL | Freq: Once | INTRAVENOUS | Status: AC | PRN
  Administered 2012-06-09: 100 mL via INTRAVENOUS

## 2012-06-09 NOTE — Progress Notes (Signed)
[facility] [place]   Patient name: Karl Walker MRN: [id number] DOB: 03/20/1932 Sex: male     Chief Complaint  Patient presents with  . AAA    6 month f/u     HISTORY OF PRESENT ILLNESS: The patient is back today for followup of his abdominal aortic aneurysm. I initially met him in 2001 when he came to the emergency department with right flank and hip pain. At that time he had a 3.5 cm abdominal aortic aneurysm. His CT scan 6 months ago showed his aneurysm had increased to 4.6 cm. I felt that it was a rather large increase in size over a short period of time and therefore he is back again today with a repeat CT scan. He continues to be without abdominal or back pain. He continues to be medically managed for his hypertension and hypercholesterolemia. He has a history of myocardial infarction in 2007 which required coronary stenting. He still undergoes frequent basal cell cancer removal.  Past Medical History  Diagnosis Date  . Urinary frequency   . Shortness of breath   . Bronchitis   . Hypertension   . COPD (chronic obstructive pulmonary disease)   . Hypercholesterolemia   . MI (myocardial infarction) history of MI in 2007  . Cancer     basal cell ca on face    Past Surgical History  Procedure Date  . Hernia repair   . Coronary angioplasty with stent placement   . Skin surgery     History   Social History  . Marital Status: Married    Spouse Name: N/A    Number of Children: N/A  . Years of Education: N/A   Occupational History  . Not on file.   Social History Main Topics  . Smoking status: Current Some Day Smoker -- 0.5 packs/day    Types: Cigarettes  . Smokeless tobacco: Never Used  . Alcohol Use: Yes     rare  . Drug Use: No  . Sexually Active: No   Other Topics Concern  . Not on file   Social History Narrative  . No narrative on file    Family History  Problem Relation Age of Onset  . Stroke Brother     Allergies as of  06/09/2012 - Review Complete 06/09/2012  Allergen Reaction Noted  . Iodine      Current Outpatient Prescriptions on File Prior to Visit  Medication Sig Dispense Refill  . albuterol (PROVENTIL) (2.5 MG/3ML) 0.083% nebulizer solution Take 2.5 mg by nebulization every 6 (six) hours as needed. Congestion      . aspirin 81 MG tablet Take 81 mg by mouth daily.        . finasteride (PROSCAR) 5 MG tablet Take 5 mg by mouth daily.      . losartan-hydrochlorothiazide (HYZAAR) 50-12.5 MG per tablet Take 1 tablet by mouth daily.        . niacin (NIASPAN) 500 MG CR tablet Take 500 mg by mouth at bedtime.      . simvastatin (ZOCOR) 40 MG tablet Take 40 mg by mouth every evening.      . VITAMIN D, ERGOCALCIFEROL, PO Take 1 tablet by mouth daily.      . dutasteride (AVODART) 0.5 MG capsule Take 0.5 mg by mouth daily.      . Tamsulosin HCl (FLOMAX) 0.4 MG CAPS Take one tablet once a day to facilitate stone passage.  5 capsule  0   No current facility-administered   medications on file prior to visit.     REVIEW OF SYSTEMS: No change from prior visit  PHYSICAL EXAMINATION:   Vital signs are BP 134/82  Pulse 96  Resp 16  Ht 5' 10.5" (1.791 m)  Wt 142 lb (64.411 kg)  BMI 20.09 kg/m2  SpO2 97% General: The patient appears their stated age. HEENT:  No gross abnormalities Pulmonary:  Non labored breathing Abdomen: Soft and non-tender. Aorta is nontender and easily palpable Musculoskeletal: There are no major deformities. Neurologic: No focal weakness or paresthesias are detected, Skin: There are no ulcer or rashes noted. Psychiatric: The patient has normal affect. Cardiovascular: There is a regular rate and rhythm without significant murmur appreciated. No carotid bruits. Palpable pedal pulses.   Diagnostic Studies CT scan was ordered for today. This shows a increase in the size of his aneurysm. Maximum diameter is now 5.1 cm. Chest CT shows a lobular soft tissue mass in the trachea just above  the carina. There is also a 5 mm nodule in the posterior medial left lower lobe  Assessment: Abdominal aortic aneurysm. Plan: The patient's aneurysm continues to increase in size at a rather faster rate. For that reason I think this needs to be repaired to prevent rupture. We went over the details of the procedure today with the patient and his wife. We talked about the complications which include access site problems, cardiopulmonary complications, intestinal ischemia, renal failure, and lower extremity complications. The patient wishes to proceed. I have scheduled his surgery for Friday, September 13 with a Cook Zenith device  I will order a carotid ultrasound today for preoperative evaluation.  The patient is followed by [facility] I will get formal cardiac clearance from them.  I am also going to send him to thoracic surgery for further input regarding the lesion at the carina and the pulmonary nodules.  I spent in excess of 60 minutes going over the patient's images and discussing the details of the procedure with the patient and his wife.  V. Wells Damali Broadfoot IV, M.D. [facility] [place] Office: [phone] Pager:  [phone]   

## 2012-06-10 NOTE — Addendum Note (Signed)
Addended by: Sharee Pimple on: 06/10/2012 09:40 AM   Modules accepted: Orders

## 2012-06-12 ENCOUNTER — Other Ambulatory Visit: Payer: Self-pay | Admitting: *Deleted

## 2012-06-12 ENCOUNTER — Institutional Professional Consult (permissible substitution) (INDEPENDENT_AMBULATORY_CARE_PROVIDER_SITE_OTHER): Payer: Medicare HMO | Admitting: Thoracic Surgery (Cardiothoracic Vascular Surgery)

## 2012-06-12 ENCOUNTER — Encounter (HOSPITAL_COMMUNITY): Payer: Self-pay | Admitting: Pharmacy Technician

## 2012-06-12 ENCOUNTER — Encounter: Payer: Self-pay | Admitting: *Deleted

## 2012-06-12 ENCOUNTER — Encounter: Payer: Self-pay | Admitting: Thoracic Surgery (Cardiothoracic Vascular Surgery)

## 2012-06-12 VITALS — BP 133/80 | HR 88 | Resp 18 | Ht 70.0 in | Wt 145.0 lb

## 2012-06-12 DIAGNOSIS — J398 Other specified diseases of upper respiratory tract: Secondary | ICD-10-CM

## 2012-06-12 DIAGNOSIS — R222 Localized swelling, mass and lump, trunk: Secondary | ICD-10-CM

## 2012-06-12 DIAGNOSIS — R918 Other nonspecific abnormal finding of lung field: Secondary | ICD-10-CM

## 2012-06-12 DIAGNOSIS — I2510 Atherosclerotic heart disease of native coronary artery without angina pectoris: Secondary | ICD-10-CM | POA: Insufficient documentation

## 2012-06-12 NOTE — Progress Notes (Signed)
PCP is Rema Fendt, NP Referring Provider is Nada Libman, MD  Chief Complaint  Patient presents with  . Lung Mass    Referral from Dr Myra Gianotti for eval on lung mass,  CTA Chest/ABD/Pelvis on 06/09/12     HPI: 78 yo WM with history of tobacco abuse and COPD who presents with a cc/o a "mass"  Mr. Alcott was being evaluated for stent grafting of a AAA by Dr. Myra Gianotti. Preoperative evaluation included a CT of the chest, abdomen and pelvis. On the CT he was found to have a tracheal mass. He does have a history of COPD and occasional wheezing, which he says the pollen makes worse. He uses a nebulizer several times a day. He denies any stridor. He denies hemoptysis, but does have a frequent cough. He has a history of CAD/ MI, but denies CP, pressure or tightness at rest or with exertion.   Past Medical History  Diagnosis Date  . Urinary frequency   . Shortness of breath   . Bronchitis   . Hypertension   . COPD (chronic obstructive pulmonary disease)   . Hypercholesterolemia   . MI (myocardial infarction) history of MI in 2007  . Cancer     basal cell ca on face  . CAD (coronary artery disease) stent in 2007  . Tracheal mass     Past Surgical History  Procedure Date  . Hernia repair   . Coronary angioplasty with stent placement   . Skin surgery     Family History  Problem Relation Age of Onset  . Stroke Brother     Social History History  Substance Use Topics  . Smoking status: Current Some Day Smoker -- 0.5 packs/day    Types: Cigarettes  . Smokeless tobacco: Never Used  . Alcohol Use: Yes     rare    Current Outpatient Prescriptions  Medication Sig Dispense Refill  . albuterol (PROVENTIL) (2.5 MG/3ML) 0.083% nebulizer solution Take 2.5 mg by nebulization every 6 (six) hours as needed. Congestion      . aspirin 81 MG tablet Take 81 mg by mouth daily.        Marland Kitchen dutasteride (AVODART) 0.5 MG capsule Take 0.5 mg by mouth daily.      Marland Kitchen losartan-hydrochlorothiazide  (HYZAAR) 50-12.5 MG per tablet Take 1 tablet by mouth daily.        . niacin (NIASPAN) 500 MG CR tablet Take 500 mg by mouth at bedtime.      . simvastatin (ZOCOR) 40 MG tablet Take 40 mg by mouth every evening.      Marland Kitchen VITAMIN D, ERGOCALCIFEROL, PO Take 1 tablet by mouth daily.        Allergies  Allergen Reactions  . Iodine     Per pt, issue with shrimp x 20 years ago, ok with CT contrast since    Review of Systems  Constitutional: Negative for fever, chills, diaphoresis and appetite change.  HENT: Positive for hearing loss.   Respiratory: Positive for cough and wheezing. Negative for chest tightness and shortness of breath.   Cardiovascular: Negative for chest pain and leg swelling.  Genitourinary: Positive for frequency and difficulty urinating.       Prostate enlargement  Neurological: Negative for speech difficulty and light-headedness.  Hematological: Bruises/bleeds easily.  All other systems reviewed and are negative.    BP 133/80  Pulse 88  Resp 18  Ht 5\' 10"  (1.778 m)  Wt 145 lb (65.772 kg)  BMI 20.81 kg/m2  SpO2  94% Physical Exam  Vitals reviewed. Constitutional: He is oriented to person, place, and time. He appears well-developed and well-nourished. No distress.  HENT:  Head: Normocephalic and atraumatic.  Eyes: EOM are normal. Pupils are equal, round, and reactive to light.  Neck: Neck supple. No thyromegaly present.  Cardiovascular: Normal rate, regular rhythm and normal heart sounds.  Exam reveals no gallop and no friction rub.   No murmur heard. Pulmonary/Chest: Effort normal and breath sounds normal. He has no wheezes. He has no rales.  Abdominal: Soft. He exhibits mass (pulsatile mass). There is no tenderness.  Musculoskeletal: He exhibits no edema.  Lymphadenopathy:    He has no cervical adenopathy.  Neurological: He is alert and oriented to person, place, and time. No cranial nerve deficit.  Skin:       Extensive ecchymosis     Diagnostic  Tests:  CT Chest, Abdomen and Pelvis 06/09/12 Clinical Data: Pre stent for AAA, smoking history  CT ANGIOGRAPHY CHEST, ABDOMEN AND PELVIS  Technique: Multidetector CT imaging through the chest, abdomen and  pelvis was performed using the standard protocol during bolus  administration of intravenous contrast. Multiplanar reconstructed  images including MIPs were obtained and reviewed to evaluate the  vascular anatomy.  Contrast: OMNIPAQUE IOHEXOL 350 MG/ML SOLN,  Comparison: CT abdomen pelvis of 11/26/2011  CTA CHEST  Findings: Emphysematous changes are present throughout the lungs,  primarily centrilobular emphysema. There is a tiny lung nodule  within the medial left lower lobe just above the hemidiaphragm  measuring 5 mm in diameter. No other definite lung nodule is seen.  No pleural effusion is noted.  However, within the tracheal airway, just above the carina and  there is a lobular mass of 1.7 x 1.3 x 1.3 cm worrisome for  malignancy. Some of this mass however does have fatty content and  this could possibly represent a benign polypoid lesion. Slightly  prominent markings are present in the right lower lobe which were  not seen on the prior CT and could represent atelectasis or  possibly pneumonia.  On soft tissue window images, multiple low attenuation thyroid  nodules are present most consistent with multinodular goiter in the  slightly prominent thyroid. The thoracic aorta opacifies and there  is moderate atheromatous change throughout the thoracic arch and  descending thoracic aorta. The origins of the great vessels are  patent. The ascending aorta measures 3.8 cm in diameter, with the  mid descending thoracic aorta measuring 3.1 cm. No aneurysmal  dilatation is seen. No mediastinal or hilar adenopathy is noted.  Faint coronary artery calcifications are present in the  distribution of the left anterior descending artery. No acute bony  abnormality is seen throughout  the thoracic spine.  Review of the MIP images confirms the above findings.  IMPRESSION:  1. Lobular soft tissue mass in the trachea just above the carina  with some fatty content. Possible benign lesion but cannot exclude  malignancy.  2. 5 mm nodule in the posterior medial left lower lobe just above  hemidiaphragm.  3. Slightly prompt markings in the right lower lobe posterior  medially may be due to atelectasis or pneumonia.  4. Probable multinodular goiter.  CTA ABDOMEN AND PELVIS  Findings: There are several low attenuation liver lesions the  majority of which appear to represent cysts or possibly small  hemangiomas. The larger lesion in the right lower lobe caudally  become slightly more isointense after delayed images and therefore  would be most consistent with  hemangioma. No intrahepatic ductal  dilatation is seen. No calcified gallstones are noted. The  pancreas is normal in size and the pancreatic duct is not dilated.  There is some motion which does obscure assessment of the pancreas.  The adrenal glands are unremarkable as is the spleen. The stomach  is not well distended. The kidneys enhance with a cyst in the  upper pole of the right kidney. No hydronephrosis is seen. There  does appear to be a small nonobstructing right mid renal calculus  present.  However, there is a distal abdominal aortic aneurysm present well  below the origins of the renal arteries. There do appear to be two  right renal arteries. There is a dominant left renal artery  proximally, but there does appear to be an accessory diminutive  left renal artery to the lower pole which extends to insert at the  level of the aortic aneurysm. There is diminished perfusion of the  lower pole of the left kidney. This infrarenal abdominal aortic  aneurysm has a maximum diameter of 5.1 x 4.9 cm with intraluminal  clot present. This aneurysm terminates at the bifurcation. There  may be a small ulcerated plaque  along the superior aspect of this  aneurysm posteriorly.  The common iliac arteries show considerable atheromatous change and  are tortuous. There is focal aneurysmal dilatation of the very  proximal right common iliac artery with a maximum diameter of 1.7  cm. There is slight irregularity of the distal left common iliac  artery with ulcerated plaque and mild aneurysmal dilatation  extending up to 1.7 cm diameter just prior to the iliac  bifurcation. Atheromatous change is noted of the iliac  bifurcations and extending into both external and internal iliac  arteries .  The urinary bladder is moderately distended and thick wall  consistent with a degree of bladder outlet obstruction. The  prostate is significantly enlarged measuring 5.4 x 6.3 cm indenting  the floor of the urinary bladder. There does appear to be an  anterior superior urinary bladder diverticulum present. No fluid  is seen within the pelvis. There are scattered rectosigmoid  colonic diverticula present.  Review of the MIP images confirms the above findings.  IMPRESSION:  1. Distal abdominal aortic aneurysm of 5.1 x 4.9 cm terminating at  the bifurcation.  2. 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  3. Aneurysmal dilatation and possible ulcerated plaque involving  both common iliac arteries with maximal diameter of 1.7 involving  the proximal right common iliac artery and 1.7 cm involving the  distal left common iliac artery.  4. Significant enlargement of the prostate with thickened urinary  bladder wall most consistent with a degree of bladder outlet  obstruction.   Impression: 78 yo WM with a smoking history who has been found to have a tracheal mass on a CT of the chest. There is some fatty content and this could be benign but malignancy can  Not be ruled out without a biopsy. He has been seen by Cardiology  and is scheduled for a stress test next week.   My recommendation would be to do an initial assessment of the tracheal mass with bronchoscopy and biopsy under general anesthesia. The goal would be to establish a diagnosis and debulk the tumor to ensure airway patency prior to his AAA repair. If this is benign we can probably complete treatment.  If this turns out to be malignant we can defer definitive management of his tracheal lesion until after his AAA has been treated.  Plan: Flexible bronchoscopy with biopsy, possible rigid bronchoscopy, possible laser bronchoscopy Tues 06/24/12

## 2012-06-18 ENCOUNTER — Encounter (HOSPITAL_COMMUNITY)
Admission: RE | Admit: 2012-06-18 | Discharge: 2012-06-18 | Disposition: A | Discharge status: Home / Self Care | Payer: Medicare HMO | Source: Ambulatory Visit | Attending: Thoracic Surgery (Cardiothoracic Vascular Surgery) | Admitting: Thoracic Surgery (Cardiothoracic Vascular Surgery)

## 2012-06-18 ENCOUNTER — Encounter (HOSPITAL_COMMUNITY): Payer: Self-pay

## 2012-06-18 VITALS — BP 162/90 | HR 79 | Temp 98.0°F | Resp 20 | Ht 70.0 in | Wt 143.5 lb

## 2012-06-18 DIAGNOSIS — J398 Other specified diseases of upper respiratory tract: Secondary | ICD-10-CM

## 2012-06-18 HISTORY — PX: NM MYOCAR PERF WALL MOTION: HXRAD629

## 2012-06-18 HISTORY — DX: Spontaneous ecchymoses: R23.3

## 2012-06-18 HISTORY — DX: Personal history of colonic polyps: Z86.010

## 2012-06-18 HISTORY — DX: Pneumonia, unspecified organism: J18.9

## 2012-06-18 HISTORY — DX: Personal history of colon polyps, unspecified: Z86.0100

## 2012-06-18 HISTORY — DX: Benign prostatic hyperplasia without lower urinary tract symptoms: N40.0

## 2012-06-18 HISTORY — DX: Abdominal aortic aneurysm, without rupture, unspecified: I71.40

## 2012-06-18 HISTORY — DX: Other specified symptoms and signs involving the circulatory and respiratory systems: R09.89

## 2012-06-18 HISTORY — DX: Xerosis cutis: L85.3

## 2012-06-18 HISTORY — DX: Other skin changes: R23.8

## 2012-06-18 HISTORY — DX: Abdominal aortic aneurysm, without rupture: I71.4

## 2012-06-18 LAB — COMPREHENSIVE METABOLIC PANEL
BUN: 18 mg/dL (ref 6–23)
CO2: 28 mEq/L (ref 19–32)
Calcium: 9.8 mg/dL (ref 8.4–10.5)
Chloride: 103 mEq/L (ref 96–112)
Creatinine, Ser: 0.9 mg/dL (ref 0.50–1.35)
GFR calc Af Amer: >90 mL/min (ref >90)
GFR calc non Af Amer: 79 mL/min — ABNORMAL LOW (ref >90)
Glucose, Bld: 104 mg/dL — ABNORMAL HIGH (ref 70–99)
Total Bilirubin: 0.6 mg/dL (ref 0.3–1.2)

## 2012-06-18 LAB — CBC
HCT: 50.8 % (ref 39.0–52.0)
MCH: 31.2 pg (ref 26.0–34.0)
MCV: 93.2 fL (ref 78.0–100.0)
RBC: 5.45 MIL/uL (ref 4.22–5.81)
WBC: 9.3 10*3/uL (ref 4.0–10.5)

## 2012-06-18 LAB — PROTIME-INR
INR: 1.12 (ref 0.00–1.49)
Prothrombin Time: 14.6 seconds (ref 11.6–15.2)

## 2012-06-18 NOTE — Pre-Procedure Instructions (Signed)
22 Karl Walker  06/18/2012   Your procedure is scheduled on:  Wed, Sept 4 @ 8:30 AM  Report to [facility] at 6:30 AM.  Call this number if you have problems the morning of surgery: [phone]   Remember:   Do not eat food:After Midnight.  Take these medicines the morning of surgery with A SIP OF WATER: Albuerol(if needed)<Bring Your Inhaler With You> and Finasteride(Proscar)   Do not wear jewelry  Do not wear lotions, powders, or colognes.  Men may shave face and neck.  Do not bring valuables to the hospital.  Contacts, dentures or bridgework may not be worn into surgery.  Leave suitcase in the car. After surgery it may be brought to your room.  For patients admitted to the hospital, checkout time is 11:00 AM the day of discharge.   Patients discharged the day of surgery will not be allowed to drive home.    Special Instructions: CHG Shower Use Special Wash: 1/2 bottle night before surgery and 1/2 bottle morning of surgery.   Please read over the following fact sheets that you were given: Pain Booklet, Coughing and Deep Breathing and Surgical Site Infection Prevention

## 2012-06-18 NOTE — Progress Notes (Addendum)
Cardiologist is with [facility] but can't remember name--to request last ov and any other cardiac notes  Stress test was done today-to request report  Last heart cath in 2006  Unsure if he has ever had an echo   Medical MD is Dr.Golding in [place]  EKG in epic from 11/2011

## 2012-06-19 NOTE — Consult Note (Addendum)
Anesthesia chart review: Patient is a 78 year old male scheduled for flexible bronchoscopy with biopsy, possible rigid bronchoscopy, possible laser bronchoscopy for tracheal mass on 06/25/2012 by Dr. Dorris Fetch. Of note, he is currently being evaluated by Vascular surgeon Dr. Myra Gianotti for a 5.1 cm AAA, which has increased in size at a rather fast rate.  He is scheduled for endovascular repair on 07/04/2012.  Other history includes smoking, COPD, bronchitis, CAD/MI '96 and s/p RCA stent '08, BPH, hypercholesterolemia, basal cell skin cancer (face).  His Cardiologist is at [facility] in [place].  Notes indicate that he is having a stress test this week.  Records are still pending.  His last cardiac cath was on 12/18/06 and showed: 1. Single-vessel coronary artery disease of the proximal RCA 75%.  2. Successful direct stenting of the proximal RCA 75% to zero.  3. Mildly elevated pulmonary artery pressures.  4. Low normal cardiac outputs.  Labs noted.    He is for a CXR on the day of surgery.  I'll follow-up once I receive his Cardiology records.   Shonna Chock, PA-C 06/19/12 1404  Addendum: 06/20/12 1300 Stress test was done on 06/18/12 and showed normal myocardial perfusion scan demonstrating an attenuation artifact in inferior region of the myocardium. No ischemia or infarct/scar is seen in the remaining myocardium. No significant ischemia demonstrated. Ejection fraction 46%. Low risk scan.     EKG on 06/11/12 showed NSR, right BBB, LAFB, cannot rule out inferior infarct, age undetermined, non-specific ST/T wave changes.    Echo from 05/24/09 showed normal LV systolic function, EF > 55%, findings suggestive of impaired LV relaxation, mild AR, mild MR.   Plan to proceed if no acute change in patient's status.

## 2012-06-25 ENCOUNTER — Encounter (HOSPITAL_COMMUNITY): Payer: Self-pay | Admitting: Vascular Surgery

## 2012-06-25 ENCOUNTER — Ambulatory Visit (HOSPITAL_COMMUNITY): Payer: Medicare HMO

## 2012-06-25 ENCOUNTER — Ambulatory Visit (HOSPITAL_COMMUNITY): Payer: Medicare HMO | Admitting: Vascular Surgery

## 2012-06-25 ENCOUNTER — Encounter (HOSPITAL_COMMUNITY): Payer: Self-pay | Admitting: *Deleted

## 2012-06-25 ENCOUNTER — Ambulatory Visit (HOSPITAL_COMMUNITY)
Admission: RE | Admit: 2012-06-25 | Discharge: 2012-06-25 | Disposition: A | Discharge status: Home / Self Care | Payer: Medicare HMO | Source: Ambulatory Visit | Attending: Thoracic Surgery (Cardiothoracic Vascular Surgery) | Admitting: Thoracic Surgery (Cardiothoracic Vascular Surgery)

## 2012-06-25 ENCOUNTER — Encounter (HOSPITAL_COMMUNITY)
Admission: RE | Disposition: A | Discharge status: Home / Self Care | Payer: Self-pay | Source: Ambulatory Visit | Attending: Thoracic Surgery (Cardiothoracic Vascular Surgery)

## 2012-06-25 ENCOUNTER — Other Ambulatory Visit: Payer: Self-pay

## 2012-06-25 DIAGNOSIS — J449 Chronic obstructive pulmonary disease, unspecified: Secondary | ICD-10-CM | POA: Insufficient documentation

## 2012-06-25 DIAGNOSIS — D1779 Benign lipomatous neoplasm of other sites: Secondary | ICD-10-CM | POA: Insufficient documentation

## 2012-06-25 DIAGNOSIS — Z01812 Encounter for preprocedural laboratory examination: Secondary | ICD-10-CM | POA: Insufficient documentation

## 2012-06-25 DIAGNOSIS — F17200 Nicotine dependence, unspecified, uncomplicated: Secondary | ICD-10-CM | POA: Insufficient documentation

## 2012-06-25 DIAGNOSIS — I10 Essential (primary) hypertension: Secondary | ICD-10-CM | POA: Insufficient documentation

## 2012-06-25 DIAGNOSIS — J4489 Other specified chronic obstructive pulmonary disease: Secondary | ICD-10-CM | POA: Insufficient documentation

## 2012-06-25 DIAGNOSIS — D491 Neoplasm of unspecified behavior of respiratory system: Secondary | ICD-10-CM

## 2012-06-25 DIAGNOSIS — J398 Other specified diseases of upper respiratory tract: Secondary | ICD-10-CM

## 2012-06-25 HISTORY — PX: VIDEO BRONCHOSCOPY: SHX5072

## 2012-06-25 SURGERY — BRONCHOSCOPY, VIDEO-ASSISTED
Anesthesia: General | Site: Chest | Wound class: Clean Contaminated

## 2012-06-25 MED ORDER — DEXTROSE 5 % IV SOLN
INTRAVENOUS | Status: AC
  Filled 2012-06-25: qty 50

## 2012-06-25 MED ORDER — ONDANSETRON HCL 4 MG/2ML IJ SOLN
INTRAMUSCULAR | Status: DC | PRN
  Administered 2012-06-25: 4 mg via INTRAVENOUS

## 2012-06-25 MED ORDER — PROMETHAZINE HCL 25 MG/ML IJ SOLN: 6.2500 mg | INTRAMUSCULAR | Status: DC | PRN

## 2012-06-25 MED ORDER — DEXTROSE 5 % IV SOLN
1.5000 g | INTRAVENOUS | Status: DC | PRN
  Administered 2012-06-25: 1.5 g via INTRAVENOUS

## 2012-06-25 MED ORDER — FENTANYL CITRATE 0.05 MG/ML IJ SOLN: 25.0000 ug | INTRAMUSCULAR | Status: DC | PRN

## 2012-06-25 MED ORDER — EPINEPHRINE HCL 1 MG/ML IJ SOLN
INTRAMUSCULAR | Status: DC | PRN
  Administered 2012-06-25: 1 mg

## 2012-06-25 MED ORDER — FENTANYL CITRATE 0.05 MG/ML IJ SOLN
INTRAMUSCULAR | Status: DC | PRN
  Administered 2012-06-25 (×3): 50 ug via INTRAVENOUS

## 2012-06-25 MED ORDER — LACTATED RINGERS IV SOLN
INTRAVENOUS | Status: DC | PRN
  Administered 2012-06-25: 08:00:00 via INTRAVENOUS

## 2012-06-25 MED ORDER — CEFUROXIME SODIUM 1.5 G IJ SOLR
INTRAMUSCULAR | Status: AC
  Filled 2012-06-25: qty 1.5

## 2012-06-25 MED ORDER — ROCURONIUM BROMIDE 100 MG/10ML IV SOLN
INTRAVENOUS | Status: DC | PRN
  Administered 2012-06-25: 5 mg via INTRAVENOUS
  Administered 2012-06-25: 40 mg via INTRAVENOUS
  Administered 2012-06-25: 5 mg via INTRAVENOUS

## 2012-06-25 MED ORDER — EPHEDRINE SULFATE 50 MG/ML IJ SOLN
INTRAMUSCULAR | Status: DC | PRN
  Administered 2012-06-25 (×2): 10 mg via INTRAVENOUS

## 2012-06-25 MED ORDER — LIDOCAINE HCL (CARDIAC) 20 MG/ML IV SOLN
INTRAVENOUS | Status: DC | PRN
  Administered 2012-06-25: 50 mg via INTRAVENOUS

## 2012-06-25 MED ORDER — PROPOFOL 10 MG/ML IV EMUL
INTRAVENOUS | Status: DC | PRN
  Administered 2012-06-25: 200 mg via INTRAVENOUS

## 2012-06-25 MED ORDER — MIDAZOLAM HCL 2 MG/2ML IJ SOLN: 0.5000 mg | Freq: Once | INTRAMUSCULAR | Status: DC | PRN

## 2012-06-25 MED ORDER — GLYCOPYRROLATE 0.2 MG/ML IJ SOLN
INTRAMUSCULAR | Status: DC | PRN
  Administered 2012-06-25: 0.4 mg via INTRAVENOUS

## 2012-06-25 MED ORDER — 0.9 % SODIUM CHLORIDE (POUR BTL) OPTIME
TOPICAL | Status: DC | PRN
  Administered 2012-06-25: 1000 mL

## 2012-06-25 MED ORDER — MEPERIDINE HCL 25 MG/ML IJ SOLN: 6.2500 mg | INTRAMUSCULAR | Status: DC | PRN

## 2012-06-25 MED ORDER — MIDAZOLAM HCL 5 MG/5ML IJ SOLN
INTRAMUSCULAR | Status: DC | PRN
  Administered 2012-06-25 (×2): 1 mg via INTRAVENOUS

## 2012-06-25 MED ORDER — NEOSTIGMINE METHYLSULFATE 1 MG/ML IJ SOLN
INTRAMUSCULAR | Status: DC | PRN
  Administered 2012-06-25: 3 mg via INTRAVENOUS

## 2012-06-25 MED ORDER — VECURONIUM BROMIDE 10 MG IV SOLR
INTRAVENOUS | Status: DC | PRN
  Administered 2012-06-25: 1 mg via INTRAVENOUS

## 2012-06-25 SURGICAL SUPPLY — 47 items
BANDAGE GAUZE ELAST BULKY 4 IN (GAUZE/BANDAGES/DRESSINGS) ×3 IMPLANT
BASKET PULM ZERO TIP 12X120 (MISCELLANEOUS) ×3 IMPLANT
BASKET PULM ZERO TIP 16X120 (BASKET) ×3 IMPLANT
BRUSH CYTOL CELLEBRITY 1.5X140 (MISCELLANEOUS) IMPLANT
CANISTER SUCTION 2500CC (MISCELLANEOUS) ×3 IMPLANT
CLOTH BEACON ORANGE TIMEOUT ST (SAFETY) ×3 IMPLANT
CONT SPEC 4OZ CLIKSEAL STRL BL (MISCELLANEOUS) ×9 IMPLANT
COTTONBALL LRG STERILE PKG (GAUZE/BANDAGES/DRESSINGS) IMPLANT
COVER TABLE BACK 60X90 (DRAPES) ×3 IMPLANT
DRAPE INCISE IOBAN 66X45 STRL (DRAPES) ×3 IMPLANT
ELECT REM PT RETURN 9FT ADLT (ELECTROSURGICAL) ×3
ELECTRODE REM PT RTRN 9FT ADLT (ELECTROSURGICAL) ×2 IMPLANT
FORCEPS BIOP RJ4 1.8 (CUTTING FORCEPS) IMPLANT
FORCEPS RADIAL JAW LRG 4 PULM (INSTRUMENTS) ×2 IMPLANT
GAS CARTRIDGE  LASER (MISCELLANEOUS) ×3 IMPLANT
GAUZE PACKING IODOFORM 1/2 (PACKING) IMPLANT
GAUZE VASELINE FOILPK 1/2 X 72 (GAUZE/BANDAGES/DRESSINGS) ×3 IMPLANT
GLOVE EUDERMIC 7 POWDERFREE (GLOVE) ×6 IMPLANT
GLOVE SURG SIGNA 7.5 PF LTX (GLOVE) ×3 IMPLANT
GLOVE SURG SS PI 7.0 STRL IVOR (GLOVE) ×3 IMPLANT
GOWN BRE IMP PREV XXLGXLNG (GOWN DISPOSABLE) ×3 IMPLANT
GOWN PREVENTION PLUS XLARGE (GOWN DISPOSABLE) ×3 IMPLANT
GOWN STRL NON-REIN LRG LVL3 (GOWN DISPOSABLE) ×3 IMPLANT
KIT BASIN OR (CUSTOM PROCEDURE TRAY) ×3 IMPLANT
KIT ROOM TURNOVER OR (KITS) ×3 IMPLANT
LASER FIBER FLEXIBLE (MISCELLANEOUS) ×3 IMPLANT
NEEDLE 22X1 1/2 (OR ONLY) (NEEDLE) IMPLANT
NEEDLE BIOPSY TRANSBRONCH 21G (NEEDLE) IMPLANT
NS IRRIG 1000ML POUR BTL (IV SOLUTION) ×3 IMPLANT
PAD ARMBOARD 7.5X6 YLW CONV (MISCELLANEOUS) ×6 IMPLANT
PAD EYE OVAL STERILE LF (GAUZE/BANDAGES/DRESSINGS) ×12 IMPLANT
RADIAL JAW LRG 4 PULMONARY (INSTRUMENTS) ×1
SNARE SHORT THROW 13M SML OVAL (MISCELLANEOUS) ×3 IMPLANT
SOLUTION ANTI FOG 6CC (MISCELLANEOUS) ×3 IMPLANT
SPONGE GAUZE 4X4 12PLY (GAUZE/BANDAGES/DRESSINGS) ×3 IMPLANT
SYR 30ML SLIP (SYRINGE) ×3 IMPLANT
SYR 5ML LUER SLIP (SYRINGE) ×3 IMPLANT
SYR CONTROL 10ML LL (SYRINGE) IMPLANT
SYR TOOMEY 50ML (SYRINGE) ×3 IMPLANT
TOWEL OR 17X24 6PK STRL BLUE (TOWEL DISPOSABLE) ×3 IMPLANT
TOWEL OR 17X26 10 PK STRL BLUE (TOWEL DISPOSABLE) ×3 IMPLANT
TRAP SPECIMEN MUCOUS 40CC (MISCELLANEOUS) ×3 IMPLANT
TUBE CONNECTING 12X1/4 (SUCTIONS) ×6 IMPLANT
VALVE BIOPSY  SINGLE USE (MISCELLANEOUS)
VALVE BIOPSY SINGLE USE (MISCELLANEOUS) IMPLANT
VALVE SUCTION BRONCHIO DISP (MISCELLANEOUS) IMPLANT
WATER STERILE IRR 1000ML POUR (IV SOLUTION) ×3 IMPLANT

## 2012-06-25 NOTE — Interval H&P Note (Signed)
History and Physical Interval Note:  06/25/2012 8:32 AM  Karl Walker  has presented today for surgery, with the diagnosis of TRACHEAL MASS  The various methods of treatment have been discussed with the patient and family. After consideration of risks, benefits and other options for treatment, the patient has consented to  Procedure(s) (LRB) with comments: VIDEO BRONCHOSCOPY (N/A) - WITH BIOPSY RIGID BRONCHOSCOPY (N/A) - POSSIBLE RIGID BRONCH OR POSSIBLE LASER BRONCH SLT LASER APPLICATION (N/A) as a surgical intervention .  The patient's history has been reviewed, patient examined, no change in status, stable for surgery.  I have reviewed the patient's chart and labs.  Questions were answered to the patient's satisfaction.     HENDRICKSON,STEVEN C

## 2012-06-25 NOTE — Transfer of Care (Signed)
Immediate Anesthesia Transfer of Care Note  Patient: Karl Walker  Procedure(s) Performed: Procedure(s) (LRB) with comments: VIDEO BRONCHOSCOPY (N/A) - Using Snare with biopsy SLT LASER APPLICATION (N/A)  Patient Location: PACU  Anesthesia Type: General  Level of Consciousness: awake, alert  and oriented  Airway & Oxygen Therapy: Patient Spontanous Breathing and Patient connected to nasal cannula oxygen  Post-op Assessment: Report given to PACU RN  Post vital signs: Reviewed and stable  Complications: No apparent anesthesia complications

## 2012-06-25 NOTE — Brief Op Note (Signed)
06/25/2012  1:01 PM  PATIENT:  Karl Walker  78 y.o. male  PRE-OPERATIVE DIAGNOSIS:  TRACHEAL MASS  POST-OPERATIVE DIAGNOSIS:  TRACHEAL MASS  PROCEDURE:  Procedure(s) (LRB) with comments: VIDEO BRONCHOSCOPY (N/A) - Using Snare with biopsy SLT LASER APPLICATION (N/A) Endoscopic resection of tracheal mass  SURGEON:  Surgeon(s) and Role:    * Loreli Slot, MD - Primary    * Ines Bloomer, MD - Assisting  ANESTHESIA:   general  EBL:  Total I/O In: 900 [I.V.:900] Out: -   BLOOD ADMINISTERED:none  DRAINS: none   LOCAL MEDICATIONS USED:  NONE  SPECIMEN:  Source of Specimen:  tracheal mass  DISPOSITION OF SPECIMEN:  PATHOLOGY  PLAN OF CARE: Discharge to home after PACU  PATIENT DISPOSITION:  PACU - hemodynamically stable.   Delay start of Pharmacological VTE agent (>24hrs) due to surgical blood loss or risk of bleeding: not applicable

## 2012-06-25 NOTE — Anesthesia Postprocedure Evaluation (Signed)
  Anesthesia Post-op Note  Patient: Karl Walker  Procedure(s) Performed: Procedure(s) (LRB) with comments: VIDEO BRONCHOSCOPY (N/A) - Using Snare with biopsy SLT LASER APPLICATION (N/A)  Patient Location: PACU  Anesthesia Type: General  Level of Consciousness: awake, alert  and oriented  Airway and Oxygen Therapy: Patient Spontanous Breathing  Post-op Pain: none  Post-op Assessment: Post-op Vital signs reviewed, Patient's Cardiovascular Status Stable, Respiratory Function Stable, Patent Airway, No signs of Nausea or vomiting and Pain level controlled  Post-op Vital Signs: Reviewed and stable  Complications: No apparent anesthesia complications

## 2012-06-25 NOTE — Anesthesia Preprocedure Evaluation (Addendum)
Anesthesia Evaluation  Patient identified by MRN, date of birth, ID band Patient awake    Reviewed: Allergy & Precautions, H&P , NPO status , Patient's Chart, lab work & pertinent test results  History of Anesthesia Complications Negative for: history of anesthetic complications  Airway Mallampati: I TM Distance: >3 FB Neck ROM: Full    Dental  (+) Teeth Intact and Dental Advisory Given   Pulmonary shortness of breath and with exertion, pneumonia -, COPD COPD inhaler, Current Smoker,  + rhonchi         Cardiovascular hypertension, Pt. on medications + CAD (single vessel ASCADz: now RCA stent), + Past MI, + Cardiac Stents (RCA stent) and + Peripheral Vascular Disease (5cm AAA) Rhythm:Regular Rate:Normal  8/13 stress test: EF 46%, no ischemia   Neuro/Psych negative neurological ROS     GI/Hepatic negative GI ROS, Neg liver ROS,   Endo/Other    Renal/GU negative Renal ROS     Musculoskeletal   Abdominal   Peds  Hematology   Anesthesia Other Findings   Reproductive/Obstetrics                         Anesthesia Physical Anesthesia Plan  ASA: III  Anesthesia Plan: General   Post-op Pain Management:    Induction: Intravenous  Airway Management Planned: Oral ETT  Additional Equipment:   Intra-op Plan:   Post-operative Plan: Extubation in OR  Informed Consent: I have reviewed the patients History and Physical, chart, labs and discussed the procedure including the risks, benefits and alternatives for the proposed anesthesia with the patient or authorized representative who has indicated his/her understanding and acceptance.   Dental advisory given  Plan Discussed with: CRNA and Surgeon  Anesthesia Plan Comments: (Plan routine monitors, GETA)        Anesthesia Quick Evaluation

## 2012-06-25 NOTE — H&P (View-Only) (Signed)
PCP is Karl Walker,Karl Walker, Karl Walker Referring Provider is Brabham, Vance W, MD  Chief Complaint  Patient presents with  . Lung Mass    Referral from Dr Brabham for eval on lung mass,  CTA Chest/ABD/Pelvis on 06/09/12     HPI: 78 yo WM with history of tobacco abuse and COPD who presents with a cc/o a "mass"  Karl Walker was being evaluated for stent grafting of a AAA by Dr. Brabham. Preoperative evaluation included a CT of the chest, abdomen and pelvis. On the CT he was found to have a tracheal mass. He does have a history of COPD and occasional wheezing, which he says the pollen makes worse. He uses a nebulizer several times a day. He denies any stridor. He denies hemoptysis, but does have a frequent cough. He has a history of CAD/ MI, but denies CP, pressure or tightness at rest or with exertion.   Past Medical History  Diagnosis Date  . Urinary frequency   . Shortness of breath   . Bronchitis   . Hypertension   . COPD (chronic obstructive pulmonary disease)   . Hypercholesterolemia   . MI (myocardial infarction) history of MI in 2007  . Cancer     basal cell ca on face  . CAD (coronary artery disease) stent in 2007  . Tracheal mass     Past Surgical History  Procedure Date  . Hernia repair   . Coronary angioplasty with stent placement   . Skin surgery     Family History  Problem Relation Age of Onset  . Stroke Brother     Social History History  Substance Use Topics  . Smoking status: Current Some Day Smoker -- 0.5 packs/day    Types: Cigarettes  . Smokeless tobacco: Never Used  . Alcohol Use: Yes     rare    Current Outpatient Prescriptions  Medication Sig Dispense Refill  . albuterol (PROVENTIL) (2.5 MG/3ML) 0.083% nebulizer solution Take 2.5 mg by nebulization every 6 (six) hours as needed. Congestion      . aspirin 81 MG tablet Take 81 mg by mouth daily.        . dutasteride (AVODART) 0.5 MG capsule Take 0.5 mg by mouth daily.      . losartan-hydrochlorothiazide  (HYZAAR) 50-12.5 MG per tablet Take 1 tablet by mouth daily.        . niacin (NIASPAN) 500 MG CR tablet Take 500 mg by mouth at bedtime.      . simvastatin (ZOCOR) 40 MG tablet Take 40 mg by mouth every evening.      . VITAMIN D, ERGOCALCIFEROL, PO Take 1 tablet by mouth daily.        Allergies  Allergen Reactions  . Iodine     Per pt, issue with shrimp x 20 years ago, ok with CT contrast since    Review of Systems  Constitutional: Negative for fever, chills, diaphoresis and appetite change.  HENT: Positive for hearing loss.   Respiratory: Positive for cough and wheezing. Negative for chest tightness and shortness of breath.   Cardiovascular: Negative for chest pain and leg swelling.  Genitourinary: Positive for frequency and difficulty urinating.       Prostate enlargement  Neurological: Negative for speech difficulty and light-headedness.  Hematological: Bruises/bleeds easily.  All other systems reviewed and are negative.    BP 133/80  Pulse 88  Resp 18  Ht 5' 10" (1.778 m)  Wt 145 lb (65.772 kg)  BMI 20.81 kg/m2  SpO2   94% Physical Exam  Vitals reviewed. Constitutional: He is oriented to person, place, and time. He appears well-developed and well-nourished. No distress.  HENT:  Head: Normocephalic and atraumatic.  Eyes: EOM are normal. Pupils are equal, round, and reactive to light.  Neck: Neck supple. No thyromegaly present.  Cardiovascular: Normal rate, regular rhythm and normal heart sounds.  Exam reveals no gallop and no friction rub.   No murmur heard. Pulmonary/Chest: Effort normal and breath sounds normal. He has no wheezes. He has no rales.  Abdominal: Soft. He exhibits mass (pulsatile mass). There is no tenderness.  Musculoskeletal: He exhibits no edema.  Lymphadenopathy:    He has no cervical adenopathy.  Neurological: He is alert and oriented to person, place, and time. No cranial nerve deficit.  Skin:       Extensive ecchymosis     Diagnostic  Tests:  CT Chest, Abdomen and Pelvis 06/09/12 Clinical Data: Pre stent for AAA, smoking history  CT ANGIOGRAPHY CHEST, ABDOMEN AND PELVIS  Technique: Multidetector CT imaging through the chest, abdomen and  pelvis was performed using the standard protocol during bolus  administration of intravenous contrast. Multiplanar reconstructed  images including MIPs were obtained and reviewed to evaluate the  vascular anatomy.  Contrast: 100mL OMNIPAQUE IOHEXOL 350 MG/ML SOLN,  Comparison: CT abdomen pelvis of 11/26/2011  CTA CHEST  Findings: Emphysematous changes are present throughout the lungs,  primarily centrilobular emphysema. There is a tiny lung nodule  within the medial left lower lobe just above the hemidiaphragm  measuring 5 mm in diameter. No other definite lung nodule is seen.  No pleural effusion is noted.  However, within the tracheal airway, just above the carina and  there is a lobular mass of 1.7 x 1.3 x 1.3 cm worrisome for  malignancy. Some of this mass however does have fatty content and  this could possibly represent a benign polypoid lesion. Slightly  prominent markings are present in the right lower lobe which were  not seen on the prior CT and could represent atelectasis or  possibly pneumonia.  On soft tissue window images, multiple low attenuation thyroid  nodules are present most consistent with multinodular goiter in the  slightly prominent thyroid. The thoracic aorta opacifies and there  is moderate atheromatous change throughout the thoracic arch and  descending thoracic aorta. The origins of the great vessels are  patent. The ascending aorta measures 3.8 cm in diameter, with the  mid descending thoracic aorta measuring 3.1 cm. No aneurysmal  dilatation is seen. No mediastinal or hilar adenopathy is noted.  Faint coronary artery calcifications are present in the  distribution of the left anterior descending artery. No acute bony  abnormality is seen throughout  the thoracic spine.  Review of the MIP images confirms the above findings.  IMPRESSION:  1. Lobular soft tissue mass in the trachea just above the carina  with some fatty content. Possible benign lesion but cannot exclude  malignancy.  2. 5 mm nodule in the posterior medial left lower lobe just above  hemidiaphragm.  3. Slightly prompt markings in the right lower lobe posterior  medially may be due to atelectasis or pneumonia.  4. Probable multinodular goiter.  CTA ABDOMEN AND PELVIS  Findings: There are several low attenuation liver lesions the  majority of which appear to represent cysts or possibly small  hemangiomas. The larger lesion in the right lower lobe caudally  become slightly more isointense after delayed images and therefore  would be most consistent with   hemangioma. No intrahepatic ductal  dilatation is seen. No calcified gallstones are noted. The  pancreas is normal in size and the pancreatic duct is not dilated.  There is some motion which does obscure assessment of the pancreas.  The adrenal glands are unremarkable as is the spleen. The stomach  is not well distended. The kidneys enhance with a cyst in the  upper pole of the right kidney. No hydronephrosis is seen. There  does appear to be a small nonobstructing right mid renal calculus  present.  However, there is a distal abdominal aortic aneurysm present well  below the origins of the renal arteries. There do appear to be two  right renal arteries. There is a dominant left renal artery  proximally, but there does appear to be an accessory diminutive  left renal artery to the lower pole which extends to insert at the  level of the aortic aneurysm. There is diminished perfusion of the  lower pole of the left kidney. This infrarenal abdominal aortic  aneurysm has a maximum diameter of 5.1 x 4.9 cm with intraluminal  clot present. This aneurysm terminates at the bifurcation. There  may be a small ulcerated plaque  along the superior aspect of this  aneurysm posteriorly.  The common iliac arteries show considerable atheromatous change and  are tortuous. There is focal aneurysmal dilatation of the very  proximal right common iliac artery with a maximum diameter of 1.7  cm. There is slight irregularity of the distal left common iliac  artery with ulcerated plaque and mild aneurysmal dilatation  extending up to 1.7 cm diameter just prior to the iliac  bifurcation. Atheromatous change is noted of the iliac  bifurcations and extending into both external and internal iliac  arteries .  The urinary bladder is moderately distended and thick wall  consistent with a degree of bladder outlet obstruction. The  prostate is significantly enlarged measuring 5.4 x 6.3 cm indenting  the floor of the urinary bladder. There does appear to be an  anterior superior urinary bladder diverticulum present. No fluid  is seen within the pelvis. There are scattered rectosigmoid  colonic diverticula present.  Review of the MIP images confirms the above findings.  IMPRESSION:  1. Distal abdominal aortic aneurysm of 5.1 x 4.9 cm terminating at  the bifurcation.  2. 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  3. Aneurysmal dilatation and possible ulcerated plaque involving  both common iliac arteries with maximal diameter of 1.7 involving  the proximal right common iliac artery and 1.7 cm involving the  distal left common iliac artery.  4. Significant enlargement of the prostate with thickened urinary  bladder wall most consistent with a degree of bladder outlet  obstruction.   Impression: 78 yo WM with a smoking history who has been found to have a tracheal mass on a CT of the chest. There is some fatty content and this could be benign but malignancy can  Not be ruled out without a biopsy. He has been seen by Cardiology  and is scheduled for a stress test next week.   My recommendation would be to do an initial assessment of the tracheal mass with bronchoscopy and biopsy under general anesthesia. The goal would be to establish a diagnosis and debulk the tumor to ensure airway patency prior to his AAA repair. If this is benign we can probably complete treatment.   If this turns out to be malignant we can defer definitive management of his tracheal lesion until after his AAA has been treated.  Plan: Flexible bronchoscopy with biopsy, possible rigid bronchoscopy, possible laser bronchoscopy Tues 06/24/12 

## 2012-06-25 NOTE — Preoperative (Signed)
Beta Blockers   Reason not to administer Beta Blockers:Not Applicable 

## 2012-06-25 NOTE — Anesthesia Procedure Notes (Signed)
Procedure Name: Intubation Date/Time: 06/25/2012 8:57 AM Performed by: Jefm Miles E Pre-anesthesia Checklist: Patient identified, Timeout performed, Emergency Drugs available, Suction available and Patient being monitored Patient Re-evaluated:Patient Re-evaluated prior to inductionOxygen Delivery Method: Circle system utilized Preoxygenation: Pre-oxygenation with 100% oxygen Intubation Type: IV induction Ventilation: Mask ventilation without difficulty Laryngoscope Size: Mac and 3 Grade View: Grade I Tube type: Oral Tube size: 8.5 mm Number of attempts: 1 Airway Equipment and Method: Stylet Placement Confirmation: ETT inserted through vocal cords under direct vision,  breath sounds checked- equal and bilateral and positive ETCO2 Secured at: 21 cm Tube secured with: Tape Dental Injury: Teeth and Oropharynx as per pre-operative assessment

## 2012-06-26 NOTE — Op Note (Signed)
NAME:  DEMAURION, DICIOCCIO                ACCOUNT NO.:  [id number]  MEDICAL RECORD NO.:  [id number]  LOCATION:  [facility]                         FACILITY:  [facility]  PHYSICIAN:  Salvatore Decent. Dorris Fetch, M.D.DATE OF BIRTH:  02-Apr-1932  DATE OF PROCEDURE:  06/25/2012 DATE OF DISCHARGE:  06/25/2012                              OPERATIVE REPORT   PREOPERATIVE DIAGNOSIS:  Tracheal mass.  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  PROCEDURE:  Bronchoscopy, endoscopic resection of tracheal tumor, YAG laser ablation.  SURGEON:  Salvatore Decent. Dorris Fetch, M.D.  ASSISTANT:  Ines Bloomer, M.D.  ANESTHESIA:  General.  FINDINGS:  Lobulated mass in the distal trachea with a wide base originating on the left side of the trachea and obstructing 80% to 90% of the tracheal lumen, frozen section revealed benign fibrous tumor. Final determination awaiting permanent pathology.  CLINICAL NOTE:  Mr. Escher is a 78 year old gentleman, who was being evaluated for an abdominal aortic aneurysm stent graft.  Preoperative evaluation included CT of the chest, abdomen, and pelvis.  On the CT, he was found to have a tracheal mass.  He was seen in consultation and advised to undergo bronchoscopy for biopsy and debulking of the mass to allow him to proceed safely with general anesthesia for his abdominal aortic aneurysm repair.  The indications, risks, benefits, and alternatives were discussed in detail with the patient.  He understood and accepted the risk and agreed to proceed.  OPERATIVE NOTE:  Mr. Arguijo was brought to the preoperative holding area on June 25, 2012.  There Anesthesia placed intravenous access and intravenous antibiotics were administered.  He was taken to the operating room, anesthetized, and intubated.  PAS hose were placed for DVT prophylaxis.  Flexible fiberoptic bronchoscopy was carried out via the endotracheal tube.  It revealed a mass with a relatively wide  base attached to the left lateral surface of the trachea.  This mass occupied 80% to 90% of the tracheal diameter.  The bronchoscope did pass beyond the mass and there was normal endobronchial anatomy and no endobronchial lesions beyond it.  A snare then was inserted through the bronchoscope and placed around the mass, this took several attempts to get the snare adequately around it.  The snare then was tightened using electrocautery and the mass was transected near its base.  The mass, which had been transected then was within the tracheal lumen and was retrieved using a basket, however, the mass was too large to bring out through the endotracheal tube, therefore the mass was brought to the tip of the endotracheal tube and the tube, bronchoscope and mass were brought out a single time, Anesthesia quickly re-intubated the patient and re-secured the endotracheal tube.  The bronchoscope was then reinserted. The mass itself was sent for frozen section, which subsequently returned benign fibrous tumor.  Definitive diagnosis will await permanent sections.  There was good hemostasis, however, there still was a relatively large amount of mass present.  Attempts to repeatedly snare the remaining tumor were unsuccessful.  The tumor was debulked with biopsy forceps.  The tumor was debrided as much as it safely could be done at one setting.  The YAG laser  then was used to ablate the remaining tumor.  Multiple pulses at 25 watts were used and care was taken to avoid any laser contact with a normal tracheal or bronchial mucosa.  There was good hemostasis.  The scope was withdrawn.  The patient was extubated in the operating room and taken to the postanesthetic care unit in good condition.     Salvatore Decent Dorris Fetch, M.D.     SCH/MEDQ  D:  06/25/2012  T:  06/26/2012  Job:  [id number]

## 2012-06-27 ENCOUNTER — Encounter (HOSPITAL_COMMUNITY): Payer: Self-pay | Admitting: Thoracic Surgery (Cardiothoracic Vascular Surgery)

## 2012-07-01 ENCOUNTER — Encounter: Payer: Self-pay | Admitting: Thoracic Surgery (Cardiothoracic Vascular Surgery)

## 2012-07-01 ENCOUNTER — Other Ambulatory Visit: Payer: Self-pay

## 2012-07-01 ENCOUNTER — Ambulatory Visit (INDEPENDENT_AMBULATORY_CARE_PROVIDER_SITE_OTHER): Payer: Medicare HMO | Admitting: Thoracic Surgery (Cardiothoracic Vascular Surgery)

## 2012-07-01 VITALS — BP 140/82 | HR 82 | Resp 18 | Ht 70.0 in | Wt 145.0 lb

## 2012-07-01 DIAGNOSIS — D179 Benign lipomatous neoplasm, unspecified: Secondary | ICD-10-CM

## 2012-07-01 DIAGNOSIS — J398 Other specified diseases of upper respiratory tract: Secondary | ICD-10-CM

## 2012-07-01 DIAGNOSIS — R222 Localized swelling, mass and lump, trunk: Secondary | ICD-10-CM

## 2012-07-01 DIAGNOSIS — D381 Neoplasm of uncertain behavior of trachea, bronchus and lung: Secondary | ICD-10-CM

## 2012-07-01 DIAGNOSIS — D369 Benign neoplasm, unspecified site: Secondary | ICD-10-CM

## 2012-07-01 DIAGNOSIS — Z09 Encounter for follow-up examination after completed treatment for conditions other than malignant neoplasm: Secondary | ICD-10-CM

## 2012-07-01 NOTE — Assessment & Plan Note (Signed)
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

## 2012-07-01 NOTE — Pre-Procedure Instructions (Signed)
Karl Walker  07/01/2012   Your procedure is scheduled on:  Friday July 04, 2012.  Report to [facility] at 0700 AM.  Call this number if you have problems the morning of surgery: [phone]   Remember:   Do not eat food or drink:After Midnight.    Take these medicines the morning of surgery with A SIP OF WATER: Albuterol nebulizer if needed   Do not wear jewelry  Do not wear lotions or colognes.   Men may shave face and neck.  Do not bring valuables to the hospital.  Contacts, dentures or bridgework may not be worn into surgery.  Leave suitcase in the car. After surgery it may be brought to your room.  For patients admitted to the hospital, checkout time is 11:00 AM the day of discharge.   Patients discharged the day of surgery will not be allowed to drive home.  Name and phone number of your driver:   Special Instructions: CHG Shower Use Special Wash: 1/2 bottle night before surgery and 1/2 bottle morning of surgery.   Please read over the following fact sheets that you were given: Pain Booklet, Coughing and Deep Breathing, Blood Transfusion Information, MRSA Information and Surgical Site Infection Prevention

## 2012-07-01 NOTE — Progress Notes (Signed)
  HPI:  [name] returns today for postoperative followup. He was being evaluated for an aortic stent graft for an abdominal aortic aneurysm and was found on CT to have a tracheal mass. We did a bronchoscopy and endoscopic resection of the mass on 06/25/2012. Final pathology was a benign lipomatous lesion. He noticed an immediate improvement in his breathing. He has not had any difficulties related to the bronchoscopy.  Past Medical History  Diagnosis Date  . Urinary frequency   . Bronchitis   . COPD (chronic obstructive pulmonary disease)   . Hypercholesterolemia     takes Niacin nightly  . Cancer     basal cell ca on face  . CAD (coronary artery disease) stent in 2007  . Tracheal mass   . Hypertension     takes Hyzaar daily  . MI (myocardial infarction) 1996  . Chest congestion     occasionally per pt and wife  . Shortness of breath     with exertion  . Pneumonia     last time 68yrs ago  . Bruises easily   . Dry skin   . Hx of colonic polyps   . Enlarged prostate     takes Proscar  . Abdominal aneurysm     scheduled procedure to take care of this 07/04/12 with Dr.Brabham      Current Outpatient Prescriptions  Medication Sig Dispense Refill  . albuterol (PROVENTIL) (2.5 MG/3ML) 0.083% nebulizer solution Take 2.5 mg by nebulization every 6 (six) hours as needed. Congestion      . aspirin 81 MG tablet Take 81 mg by mouth daily.        . finasteride (PROSCAR) 5 MG tablet Take 5 mg by mouth daily.      Marland Kitchen losartan-hydrochlorothiazide (HYZAAR) 50-12.5 MG per tablet Take 1 tablet by mouth daily.        . niacin (NIASPAN) 500 MG CR tablet Take 500 mg by mouth at bedtime.      Marland Kitchen VITAMIN D, ERGOCALCIFEROL, PO Take 1 tablet by mouth daily.        Physical Exam BP 140/82  Pulse 82  Resp 18  Ht 5\' 10"  (1.778 m)  Wt 145 lb (65.772 kg)  BMI 20.81 kg/m2  SpO2 65% Thin 78 year old male in no acute distress Lungs clear bilaterally with no stridor or wheezing  Diagnostic  Tests: None  Impression: 78 year old male status post endoscopic resection of a benign tracheal mass. This mass was occupying about 90% of the cross-sectional area of the trachea. As would be expected he noticed an immediate improvement in his breathing.  This is a benign lesion but there is some risk of recurrence. I recommended that we do a CT of the chest in one year to rule out recurrence.  He is safe to proceed with his abdominal aortic stent graft procedure.  Plan: Return in one year with CT of the chest

## 2012-07-02 ENCOUNTER — Encounter (HOSPITAL_COMMUNITY): Payer: Self-pay

## 2012-07-02 ENCOUNTER — Encounter (HOSPITAL_COMMUNITY)
Admission: RE | Admit: 2012-07-02 | Discharge: 2012-07-02 | Disposition: A | Discharge status: Home / Self Care | Payer: Medicare HMO | Source: Ambulatory Visit | Attending: Surgery | Admitting: Surgery

## 2012-07-02 LAB — SURGICAL PCR SCREEN
MRSA, PCR: NEGATIVE
Staphylococcus aureus: NEGATIVE

## 2012-07-02 LAB — CBC
HCT: 45.9 % (ref 39.0–52.0)
MCHC: 34.4 g/dL (ref 30.0–36.0)
RDW: 14.5 % (ref 11.5–15.5)

## 2012-07-02 LAB — BLOOD GAS, ARTERIAL
Drawn by: 206361
FIO2: 0.21 %
O2 Saturation: 96.8 %
Patient temperature: 98.6

## 2012-07-02 LAB — APTT: aPTT: 28 seconds (ref 24–37)

## 2012-07-02 LAB — COMPREHENSIVE METABOLIC PANEL
Albumin: 3.1 g/dL — ABNORMAL LOW (ref 3.5–5.2)
Alkaline Phosphatase: 54 U/L (ref 39–117)
BUN: 16 mg/dL (ref 6–23)
Calcium: 9.9 mg/dL (ref 8.4–10.5)
Potassium: 4.8 mEq/L (ref 3.5–5.1)
Sodium: 138 mEq/L (ref 135–145)
Total Protein: 6.1 g/dL (ref 6.0–8.3)

## 2012-07-02 LAB — TYPE AND SCREEN: ABO/RH(D): O POS

## 2012-07-02 NOTE — Progress Notes (Signed)
Patient unable to urinate at this time. Will collect DOS.

## 2012-07-02 NOTE — Consult Note (Signed)
Anesthesia Chart Review:  Patient is a 78 year old male scheduled for EVAR of AAA on 07/03/12 by Dr. Myra Gianotti.  He is s/p bronchoscopy and endoscopic resection of a tracheal mass on 06/25/12 with final pathology showing benign lipomatous lesion.  I reviewed his chart at that time.  Please refer to my note from Encounter dated 06/25/12.    CXR on 06/25/12 showed severe COPD, no acute abnormalities.  Today's labs noted.  He could not void at his PAT visit, so his UA is going to be done on the day of surgery.  Shonna Chock, PA-C

## 2012-07-03 MED ORDER — DEXTROSE 5 % IV SOLN
1.5000 g | INTRAVENOUS | Status: AC
  Administered 2012-07-04: 1.5 g via INTRAVENOUS
  Filled 2012-07-03: qty 1.5

## 2012-07-04 ENCOUNTER — Encounter (HOSPITAL_COMMUNITY): Payer: Self-pay | Admitting: *Deleted

## 2012-07-04 ENCOUNTER — Encounter (HOSPITAL_COMMUNITY)
Admission: RE | Disposition: A | Discharge status: Home / Self Care | Payer: Self-pay | Source: Ambulatory Visit | Attending: Surgery

## 2012-07-04 ENCOUNTER — Other Ambulatory Visit: Payer: Self-pay | Admitting: *Deleted

## 2012-07-04 ENCOUNTER — Inpatient Hospital Stay (HOSPITAL_COMMUNITY): Payer: Medicare HMO

## 2012-07-04 ENCOUNTER — Inpatient Hospital Stay (HOSPITAL_COMMUNITY): Payer: Medicare HMO | Admitting: Vascular Surgery

## 2012-07-04 ENCOUNTER — Encounter (HOSPITAL_COMMUNITY): Payer: Self-pay | Admitting: Vascular Surgery

## 2012-07-04 ENCOUNTER — Inpatient Hospital Stay (HOSPITAL_COMMUNITY)
Admission: RE | Admit: 2012-07-04 | Discharge: 2012-07-05 | DRG: 238 | Disposition: A | Discharge status: Home / Self Care | Payer: Medicare HMO | Source: Ambulatory Visit | Attending: Surgery | Admitting: Surgery

## 2012-07-04 DIAGNOSIS — J449 Chronic obstructive pulmonary disease, unspecified: Secondary | ICD-10-CM | POA: Diagnosis present

## 2012-07-04 DIAGNOSIS — Z9861 Coronary angioplasty status: Secondary | ICD-10-CM

## 2012-07-04 DIAGNOSIS — Z48812 Encounter for surgical aftercare following surgery on the circulatory system: Secondary | ICD-10-CM

## 2012-07-04 DIAGNOSIS — Z01812 Encounter for preprocedural laboratory examination: Secondary | ICD-10-CM

## 2012-07-04 DIAGNOSIS — I714 Abdominal aortic aneurysm, without rupture, unspecified: Principal | ICD-10-CM | POA: Diagnosis present

## 2012-07-04 DIAGNOSIS — Z79899 Other long term (current) drug therapy: Secondary | ICD-10-CM

## 2012-07-04 DIAGNOSIS — N401 Enlarged prostate with lower urinary tract symptoms: Secondary | ICD-10-CM | POA: Diagnosis present

## 2012-07-04 DIAGNOSIS — I252 Old myocardial infarction: Secondary | ICD-10-CM

## 2012-07-04 DIAGNOSIS — Z85828 Personal history of other malignant neoplasm of skin: Secondary | ICD-10-CM

## 2012-07-04 DIAGNOSIS — F17200 Nicotine dependence, unspecified, uncomplicated: Secondary | ICD-10-CM | POA: Diagnosis present

## 2012-07-04 DIAGNOSIS — R339 Retention of urine, unspecified: Secondary | ICD-10-CM | POA: Diagnosis present

## 2012-07-04 DIAGNOSIS — N138 Other obstructive and reflux uropathy: Secondary | ICD-10-CM | POA: Diagnosis present

## 2012-07-04 DIAGNOSIS — E7800 Pure hypercholesterolemia, unspecified: Secondary | ICD-10-CM | POA: Diagnosis present

## 2012-07-04 DIAGNOSIS — J4489 Other specified chronic obstructive pulmonary disease: Secondary | ICD-10-CM | POA: Diagnosis present

## 2012-07-04 DIAGNOSIS — I10 Essential (primary) hypertension: Secondary | ICD-10-CM | POA: Diagnosis present

## 2012-07-04 DIAGNOSIS — Z7982 Long term (current) use of aspirin: Secondary | ICD-10-CM

## 2012-07-04 HISTORY — PX: ABDOMINAL AORTIC ANEURYSM REPAIR: SUR1152

## 2012-07-04 LAB — BASIC METABOLIC PANEL
CO2: 31 mEq/L (ref 19–32)
Calcium: 8.8 mg/dL (ref 8.4–10.5)
Chloride: 105 mEq/L (ref 96–112)
Glucose, Bld: 104 mg/dL — ABNORMAL HIGH (ref 70–99)
Sodium: 140 mEq/L (ref 135–145)

## 2012-07-04 LAB — PROTIME-INR
INR: 1.19 (ref 0.00–1.49)
Prothrombin Time: 15.4 seconds — ABNORMAL HIGH (ref 11.6–15.2)

## 2012-07-04 LAB — URINALYSIS, ROUTINE W REFLEX MICROSCOPIC
Bilirubin Urine: NEGATIVE
Hgb urine dipstick: NEGATIVE
Ketones, ur: NEGATIVE mg/dL
Protein, ur: NEGATIVE mg/dL
Urobilinogen, UA: 0.2 mg/dL (ref 0.0–1.0)

## 2012-07-04 LAB — CBC
Hemoglobin: 13.6 g/dL (ref 13.0–17.0)
MCH: 30.6 pg (ref 26.0–34.0)
MCV: 92.4 fL (ref 78.0–100.0)
RBC: 4.45 MIL/uL (ref 4.22–5.81)
WBC: 7.5 10*3/uL (ref 4.0–10.5)

## 2012-07-04 LAB — APTT: aPTT: 36 seconds (ref 24–37)

## 2012-07-04 LAB — MAGNESIUM: Magnesium: 1.6 mg/dL (ref 1.5–2.5)

## 2012-07-04 SURGERY — INSERTION, ENDOVASCULAR STENT GRAFT, AORTA, ABDOMINAL
Anesthesia: General | Wound class: Clean

## 2012-07-04 MED ORDER — LIDOCAINE HCL 4 % MT SOLN
OROMUCOSAL | Status: DC | PRN
  Administered 2012-07-04: 4 mL via TOPICAL

## 2012-07-04 MED ORDER — ALBUTEROL SULFATE (5 MG/ML) 0.5% IN NEBU
2.5000 mg | INHALATION_SOLUTION | RESPIRATORY_TRACT | Status: DC | PRN
  Administered 2012-07-05: 2.5 mg via RESPIRATORY_TRACT
  Filled 2012-07-04: qty 0.5

## 2012-07-04 MED ORDER — FINASTERIDE 5 MG PO TABS
5.0000 mg | ORAL_TABLET | Freq: Every day | ORAL | Status: DC
  Administered 2012-07-04: 5 mg via ORAL
  Filled 2012-07-04 (×2): qty 1

## 2012-07-04 MED ORDER — MIDAZOLAM HCL 2 MG/2ML IJ SOLN: 1.0000 mg | INTRAMUSCULAR | Status: DC | PRN

## 2012-07-04 MED ORDER — FENTANYL CITRATE 0.05 MG/ML IJ SOLN: 50.0000 ug | INTRAMUSCULAR | Status: DC | PRN

## 2012-07-04 MED ORDER — PROTAMINE SULFATE 10 MG/ML IV SOLN
INTRAVENOUS | Status: DC | PRN
  Administered 2012-07-04 (×5): 10 mg via INTRAVENOUS

## 2012-07-04 MED ORDER — HYDRALAZINE HCL 20 MG/ML IJ SOLN
INTRAMUSCULAR | Status: AC
  Administered 2012-07-04: 10 mg
  Filled 2012-07-04: qty 1

## 2012-07-04 MED ORDER — SODIUM CHLORIDE 0.9 % IV SOLN: INTRAVENOUS | Status: DC

## 2012-07-04 MED ORDER — ALUM & MAG HYDROXIDE-SIMETH 200-200-20 MG/5ML PO SUSP: 15.0000 mL | ORAL | Status: DC | PRN

## 2012-07-04 MED ORDER — POTASSIUM CHLORIDE CRYS ER 20 MEQ PO TBCR: 20.0000 meq | EXTENDED_RELEASE_TABLET | Freq: Once | ORAL | Status: AC | PRN

## 2012-07-04 MED ORDER — PANTOPRAZOLE SODIUM 40 MG PO TBEC: 40.0000 mg | DELAYED_RELEASE_TABLET | Freq: Every day | ORAL | Status: DC

## 2012-07-04 MED ORDER — GUAIFENESIN-DM 100-10 MG/5ML PO SYRP: 15.0000 mL | ORAL_SOLUTION | ORAL | Status: DC | PRN

## 2012-07-04 MED ORDER — SODIUM CHLORIDE 0.9 % IV SOLN: 500.0000 mL | Freq: Once | INTRAVENOUS | Status: AC | PRN

## 2012-07-04 MED ORDER — SODIUM CHLORIDE 0.9 % IV SOLN
INTRAVENOUS | Status: DC | PRN
  Administered 2012-07-04: 11:00:00 via INTRAVENOUS

## 2012-07-04 MED ORDER — METOPROLOL TARTRATE 1 MG/ML IV SOLN: 2.0000 mg | INTRAVENOUS | Status: DC | PRN

## 2012-07-04 MED ORDER — NEOSTIGMINE METHYLSULFATE 1 MG/ML IJ SOLN
INTRAMUSCULAR | Status: DC | PRN
  Administered 2012-07-04: 4 mg via INTRAVENOUS

## 2012-07-04 MED ORDER — GLYCOPYRROLATE 0.2 MG/ML IJ SOLN
INTRAMUSCULAR | Status: DC | PRN
  Administered 2012-07-04: .5 mg via INTRAVENOUS
  Administered 2012-07-04: .2 mg via INTRAVENOUS

## 2012-07-04 MED ORDER — FENTANYL CITRATE 0.05 MG/ML IJ SOLN
INTRAMUSCULAR | Status: DC | PRN
  Administered 2012-07-04 (×2): 100 ug via INTRAVENOUS
  Administered 2012-07-04: 50 ug via INTRAVENOUS
  Administered 2012-07-04: 100 ug via INTRAVENOUS

## 2012-07-04 MED ORDER — ALBUTEROL SULFATE (5 MG/ML) 0.5% IN NEBU
2.5000 mg | INHALATION_SOLUTION | RESPIRATORY_TRACT | Status: DC
  Filled 2012-07-04: qty 0.5

## 2012-07-04 MED ORDER — ONDANSETRON HCL 4 MG/2ML IJ SOLN: 4.0000 mg | Freq: Four times a day (QID) | INTRAMUSCULAR | Status: DC | PRN

## 2012-07-04 MED ORDER — SODIUM CHLORIDE 0.9 % IV SOLN
INTRAVENOUS | Status: DC
  Administered 2012-07-04: 75 mL/h via INTRAVENOUS

## 2012-07-04 MED ORDER — DOPAMINE-DEXTROSE 3.2-5 MG/ML-% IV SOLN: 3.0000 ug/kg/min | INTRAVENOUS | Status: DC

## 2012-07-04 MED ORDER — ONDANSETRON HCL 4 MG/2ML IJ SOLN
INTRAMUSCULAR | Status: DC | PRN
  Administered 2012-07-04: 4 mg via INTRAVENOUS

## 2012-07-04 MED ORDER — ROCURONIUM BROMIDE 100 MG/10ML IV SOLN
INTRAVENOUS | Status: DC | PRN
  Administered 2012-07-04: 30 mg via INTRAVENOUS
  Administered 2012-07-04: 20 mg via INTRAVENOUS
  Administered 2012-07-04: 10 mg via INTRAVENOUS
  Administered 2012-07-04: 50 mg via INTRAVENOUS

## 2012-07-04 MED ORDER — LABETALOL HCL 5 MG/ML IV SOLN
10.0000 mg | INTRAVENOUS | Status: DC | PRN
  Administered 2012-07-04 (×2): 10 mg via INTRAVENOUS
  Filled 2012-07-04: qty 4

## 2012-07-04 MED ORDER — LABETALOL HCL 5 MG/ML IV SOLN
INTRAVENOUS | Status: AC
  Filled 2012-07-04: qty 4

## 2012-07-04 MED ORDER — SODIUM CHLORIDE 0.9 % IR SOLN
Status: DC | PRN
  Administered 2012-07-04: 08:00:00

## 2012-07-04 MED ORDER — PROPOFOL 10 MG/ML IV BOLUS
INTRAVENOUS | Status: DC | PRN
  Administered 2012-07-04: 150 mg via INTRAVENOUS

## 2012-07-04 MED ORDER — NIACIN ER (ANTIHYPERLIPIDEMIC) 500 MG PO TBCR
500.0000 mg | EXTENDED_RELEASE_TABLET | Freq: Every day | ORAL | Status: DC
  Administered 2012-07-04: 500 mg via ORAL
  Filled 2012-07-04 (×2): qty 1

## 2012-07-04 MED ORDER — HYDRALAZINE HCL 20 MG/ML IJ SOLN: 10.0000 mg | INTRAMUSCULAR | Status: DC | PRN

## 2012-07-04 MED ORDER — MIDAZOLAM HCL 5 MG/5ML IJ SOLN
INTRAMUSCULAR | Status: DC | PRN
  Administered 2012-07-04: 2 mg via INTRAVENOUS

## 2012-07-04 MED ORDER — MORPHINE SULFATE 2 MG/ML IJ SOLN: 2.0000 mg | INTRAMUSCULAR | Status: DC | PRN

## 2012-07-04 MED ORDER — DEXTROSE 5 % IV SOLN
INTRAVENOUS | Status: DC | PRN
  Administered 2012-07-04: 09:00:00 via INTRAVENOUS

## 2012-07-04 MED ORDER — ASPIRIN EC 81 MG PO TBEC
81.0000 mg | DELAYED_RELEASE_TABLET | Freq: Every day | ORAL | Status: DC
  Filled 2012-07-04: qty 1

## 2012-07-04 MED ORDER — MAGNESIUM SULFATE 40 MG/ML IJ SOLN
2.0000 g | Freq: Once | INTRAMUSCULAR | Status: AC | PRN
  Filled 2012-07-04: qty 50

## 2012-07-04 MED ORDER — OXYCODONE-ACETAMINOPHEN 5-325 MG PO TABS: 1.0000 | ORAL_TABLET | ORAL | Status: DC | PRN

## 2012-07-04 MED ORDER — ACETAMINOPHEN 650 MG RE SUPP: 325.0000 mg | RECTAL | Status: DC | PRN

## 2012-07-04 MED ORDER — 0.9 % SODIUM CHLORIDE (POUR BTL) OPTIME
TOPICAL | Status: DC | PRN
  Administered 2012-07-04: 1000 mL

## 2012-07-04 MED ORDER — LACTATED RINGERS IV SOLN
INTRAVENOUS | Status: DC | PRN
  Administered 2012-07-04 (×2): via INTRAVENOUS

## 2012-07-04 MED ORDER — HYDROMORPHONE HCL PF 1 MG/ML IJ SOLN: 0.2500 mg | INTRAMUSCULAR | Status: DC | PRN

## 2012-07-04 MED ORDER — DEXTROSE 5 % IV SOLN
1.5000 g | Freq: Two times a day (BID) | INTRAVENOUS | Status: AC
  Administered 2012-07-04 – 2012-07-05 (×2): 1.5 g via INTRAVENOUS
  Filled 2012-07-04 (×3): qty 1.5

## 2012-07-04 MED ORDER — HEPARIN SODIUM (PORCINE) 1000 UNIT/ML IJ SOLN
INTRAMUSCULAR | Status: DC | PRN
  Administered 2012-07-04: 6000 [IU] via INTRAVENOUS

## 2012-07-04 MED ORDER — IODIXANOL 320 MG/ML IV SOLN
INTRAVENOUS | Status: DC | PRN
  Administered 2012-07-04: 150 mL via INTRA_ARTERIAL

## 2012-07-04 MED ORDER — PHENOL 1.4 % MT LIQD: 1.0000 | OROMUCOSAL | Status: DC | PRN

## 2012-07-04 MED ORDER — ACETAMINOPHEN 325 MG PO TABS: 325.0000 mg | ORAL_TABLET | ORAL | Status: DC | PRN

## 2012-07-04 MED ORDER — DOCUSATE SODIUM 100 MG PO CAPS: 100.0000 mg | ORAL_CAPSULE | Freq: Every day | ORAL | Status: DC

## 2012-07-04 MED ORDER — PHENYLEPHRINE HCL 10 MG/ML IJ SOLN
10.0000 mg | INTRAVENOUS | Status: DC | PRN
  Administered 2012-07-04: 15 ug/min via INTRAVENOUS

## 2012-07-04 SURGICAL SUPPLY — 87 items
BAG BANDED W/RUBBER/TAPE 36X54 (MISCELLANEOUS) ×2 IMPLANT
BAG DECANTER FOR FLEXI CONT (MISCELLANEOUS) IMPLANT
BAG SNAP BAND KOVER 36X36 (MISCELLANEOUS) ×4 IMPLANT
BALLN CODA OCL 2-9.0-35-120-3 (BALLOONS) ×2
BALLOON COD OCL 2-9.0-35-120-3 (BALLOONS) ×1 IMPLANT
CANISTER SUCTION 2500CC (MISCELLANEOUS) ×2 IMPLANT
CATH BEACON 5.038 65CM KMP-01 (CATHETERS) ×2 IMPLANT
CATH OMNI FLUSH .035X70CM (CATHETERS) ×2 IMPLANT
CATH VISIONS PV .035 IVUS (CATHETERS) ×2 IMPLANT
CLIP TI MEDIUM 24 (CLIP) IMPLANT
CLIP TI WIDE RED SMALL 24 (CLIP) IMPLANT
CLOTH BEACON ORANGE TIMEOUT ST (SAFETY) ×2 IMPLANT
COVER DOME SNAP 22 D (MISCELLANEOUS) ×2 IMPLANT
COVER MAYO STAND STRL (DRAPES) ×2 IMPLANT
COVER PROBE W GEL 5X96 (DRAPES) ×2 IMPLANT
COVER SURGICAL LIGHT HANDLE (MISCELLANEOUS) ×2 IMPLANT
DERMABOND ADVANCED (GAUZE/BANDAGES/DRESSINGS) ×1
DERMABOND ADVANCED .7 DNX12 (GAUZE/BANDAGES/DRESSINGS) ×1 IMPLANT
DEVICE CLOSURE PERCLS PRGLD 6F (VASCULAR PRODUCTS) ×5 IMPLANT
DEVICE TORQUE 50000 (MISCELLANEOUS) IMPLANT
DRAIN CHANNEL 10F 3/8 F FF (DRAIN) IMPLANT
DRAIN CHANNEL 10M FLAT 3/4 FLT (DRAIN) IMPLANT
DRAPE TABLE COVER HEAVY DUTY (DRAPES) ×2 IMPLANT
DRESSING OPSITE X SMALL 2X3 (GAUZE/BANDAGES/DRESSINGS) ×4 IMPLANT
ELECT REM PT RETURN 9FT ADLT (ELECTROSURGICAL) ×4
ELECTRODE REM PT RTRN 9FT ADLT (ELECTROSURGICAL) ×2 IMPLANT
EVACUATOR 3/16  PVC DRAIN (DRAIN)
EVACUATOR 3/16 PVC DRAIN (DRAIN) IMPLANT
EVACUATOR SILICONE 100CC (DRAIN) IMPLANT
GEL ULTRASOUND 20GR AQUASONIC (MISCELLANEOUS) ×2 IMPLANT
GLOVE BIO SURGEON STRL SZ 6.5 (GLOVE) ×2 IMPLANT
GLOVE BIOGEL PI IND STRL 6.5 (GLOVE) ×4 IMPLANT
GLOVE BIOGEL PI IND STRL 7.0 (GLOVE) ×1 IMPLANT
GLOVE BIOGEL PI IND STRL 7.5 (GLOVE) ×1 IMPLANT
GLOVE BIOGEL PI INDICATOR 6.5 (GLOVE) ×4
GLOVE BIOGEL PI INDICATOR 7.0 (GLOVE) ×1
GLOVE BIOGEL PI INDICATOR 7.5 (GLOVE) ×1
GLOVE ECLIPSE 7.0 STRL STRAW (GLOVE) ×2 IMPLANT
GLOVE SS BIOGEL STRL SZ 7 (GLOVE) ×1 IMPLANT
GLOVE SUPERSENSE BIOGEL SZ 7 (GLOVE) ×1
GLOVE SURG SS PI 6.5 STRL IVOR (GLOVE) ×6 IMPLANT
GLOVE SURG SS PI 7.5 STRL IVOR (GLOVE) ×2 IMPLANT
GOWN PREVENTION PLUS XXLARGE (GOWN DISPOSABLE) ×2 IMPLANT
GOWN STRL NON-REIN LRG LVL3 (GOWN DISPOSABLE) ×10 IMPLANT
GRAFT BALLN CATH 65CM (STENTS) IMPLANT
GRAFT FLEX Z TRAK TFFB-24-96 (Endovascular Graft) ×2 IMPLANT
GRAFT LEG ILIAC ZSLE-16-74-ZT (Endovascular Graft) ×2 IMPLANT
GRAFT LEG ILIAC ZSLE-16-90-ZT (Endovascular Graft) ×2 IMPLANT
HEMOSTAT SNOW SURGICEL 2X4 (HEMOSTASIS) IMPLANT
HEMOSTAT SURGICEL 2X14 (HEMOSTASIS) IMPLANT
INTRODUCER PERFORM 14 30 .038 (SHEATH) ×2 IMPLANT
KIT BASIN OR (CUSTOM PROCEDURE TRAY) ×2 IMPLANT
KIT ROOM TURNOVER OR (KITS) ×2 IMPLANT
MARKER SKIN DUAL TIP RULER LAB (MISCELLANEOUS) ×2 IMPLANT
NAMIC PROTECTION STATION ×2 IMPLANT
NEEDLE PERC 18GX7CM (NEEDLE) ×2 IMPLANT
NS IRRIG 1000ML POUR BTL (IV SOLUTION) ×2 IMPLANT
PACK AORTA (CUSTOM PROCEDURE TRAY) ×2 IMPLANT
PAD ARMBOARD 7.5X6 YLW CONV (MISCELLANEOUS) ×4 IMPLANT
PENCIL BUTTON HOLSTER BLD 10FT (ELECTRODE) IMPLANT
PERCLOSE PROGLIDE 6F (VASCULAR PRODUCTS) ×10
SHEATH 16FRX35 (SHEATH) ×2 IMPLANT
SHEATH AVANTI 11CM 8FR (MISCELLANEOUS) ×2 IMPLANT
SHEATH BRITE TIP 8FR 23CM (MISCELLANEOUS) ×2 IMPLANT
SHEATH INTRODUCER 18X30 (VASCULAR PRODUCTS) ×1
SHEATH PERFORMER 18FRX30 (VASCULAR PRODUCTS) ×1 IMPLANT
STAPLER VISISTAT 35W (STAPLE) IMPLANT
STENT GRAFT BALLN CATH 65CM (STENTS)
STOPCOCK MORSE 400PSI 3WAY (MISCELLANEOUS) ×2 IMPLANT
SUT ETHILON 3 0 PS 1 (SUTURE) IMPLANT
SUT PROLENE 5 0 C 1 24 (SUTURE) IMPLANT
SUT VIC AB 2-0 CT1 36 (SUTURE) IMPLANT
SUT VIC AB 3-0 SH 27 (SUTURE)
SUT VIC AB 3-0 SH 27X BRD (SUTURE) IMPLANT
SUT VICRYL 4-0 PS2 18IN ABS (SUTURE) ×4 IMPLANT
SYR 20CC LL (SYRINGE) ×4 IMPLANT
SYR 30ML LL (SYRINGE) ×2 IMPLANT
SYR 5ML LL (SYRINGE) IMPLANT
SYR MEDRAD MARK V 150ML (SYRINGE) ×2 IMPLANT
SYRINGE 10CC LL (SYRINGE) ×6 IMPLANT
TOWEL OR 17X24 6PK STRL BLUE (TOWEL DISPOSABLE) ×4 IMPLANT
TOWEL OR 17X26 10 PK STRL BLUE (TOWEL DISPOSABLE) ×4 IMPLANT
TRAY FOLEY CATH 14FRSI W/METER (CATHETERS) ×2 IMPLANT
TUBING HIGH PRESSURE 120CM (CONNECTOR) ×2 IMPLANT
WATER STERILE IRR 1000ML POUR (IV SOLUTION) IMPLANT
WIRE AMPLATZ SS-J .035X260CM (WIRE) ×4 IMPLANT
WIRE BENTSON .035X145CM (WIRE) ×4 IMPLANT

## 2012-07-04 NOTE — Progress Notes (Signed)
Dr Myra Gianotti at bedside.  States pt may have Rt IJ removed.  Also provided order for NS rate of 75.

## 2012-07-04 NOTE — Progress Notes (Signed)
ANTIBIOTIC CONSULT NOTE - INITIAL  Pharmacy Consult for Pharmacy may adjust antibiotics for renal fxn Indication: Zinacef for post-op prophylaxis  Allergies  Allergen Reactions  . Iodine     Per pt, issue with shrimp x 20 years ago, ok with CT contrast since    Patient Measurements:   Adjusted Body Weight:   Vital Signs: Temp: 98.3 F (36.8 C) (09/13 1706) Temp src: Oral (09/13 1706) BP: 137/58 mmHg (09/13 1630) Pulse Rate: 72  (09/13 1630) Intake/Output from previous day:   Intake/Output from this shift: Total I/O In: 2860 [P.O.:60; I.V.:2800] Out: 700 [Urine:675; Blood:25]  Labs:  [facility] 07/04/12 1226 07/02/12 1233  WBC 7.5 8.0  HGB 13.6 15.8  PLT 155 207  LABCREA -- --  CREATININE 0.83 1.10   The CrCl is unknown because both a height and weight (above a minimum accepted value) are required for this calculation. No results found for this basename: VANCOTROUGH:2,VANCOPEAK:2,VANCORANDOM:2,GENTTROUGH:2,GENTPEAK:2,GENTRANDOM:2,TOBRATROUGH:2,TOBRAPEAK:2,TOBRARND:2,AMIKACINPEAK:2,AMIKACINTROU:2,AMIKACIN:2, in the last 72 hours   Microbiology: Recent Results (from the past 720 hour(s))  SURGICAL PCR SCREEN     Status: Normal   Collection Time   07/02/12 12:31 PM      Component Value Range Status Comment   MRSA, PCR NEGATIVE  NEGATIVE Final    Staphylococcus aureus NEGATIVE  NEGATIVE Final     Medical History: Past Medical History  Diagnosis Date  . Urinary frequency   . Bronchitis   . COPD (chronic obstructive pulmonary disease)   . Hypercholesterolemia     takes Niacin nightly  . Cancer     basal cell ca on face  . CAD (coronary artery disease) stent in 2007  . Tracheal mass   . Hypertension     takes Hyzaar daily  . MI (myocardial infarction) 1996  . Chest congestion     occasionally per pt and wife  . Shortness of breath     with exertion  . Pneumonia     last time 60yrs ago  . Bruises easily   . Dry skin   . Hx of colonic polyps   . Enlarged  prostate     takes Proscar  . Abdominal aneurysm     scheduled procedure to take care of this 07/04/12 with Dr.Brabham   Assessment: 78yo male s/p AAA repair with order for Zinacef 1.5gm IV q12 x 2 doses.  Cr is 0.78   Plan:  1.  No dosage adjustment needed.  Marisue Humble, PharmD Clinical Pharmacist [facility]

## 2012-07-04 NOTE — Progress Notes (Signed)
Rt IJ removed per Dr Chaney Malling catheter intact.  One stitch placed over insertion site after removal. Gauze and tegaderm dsg in place.  Will monitor.

## 2012-07-04 NOTE — Progress Notes (Signed)
Dr Chaney Malling at bedside to place stitch in leaking Rt IJ instertion site.

## 2012-07-04 NOTE — Transfer of Care (Signed)
Immediate Anesthesia Transfer of Care Note  Patient: Karl Walker  Procedure(s) Performed: Procedure(s) (LRB) with comments: ABDOMINAL AORTIC ENDOVASCULAR STENT GRAFT (N/A) - Ultrasound guided; COOK  Patient Location: PACU  Anesthesia Type: General  Level of Consciousness: awake, alert  and oriented  Airway & Oxygen Therapy: Patient Spontanous Breathing and Patient connected to nasal cannula oxygen  Post-op Assessment: Report given to PACU RN and Post -op Vital signs reviewed and stable  Post vital signs: Reviewed and stable  Complications: No apparent anesthesia complications

## 2012-07-04 NOTE — Progress Notes (Signed)
1610   Friday.........URINE SAMPLE SENT TO LAB AS REQUESTED.......DA

## 2012-07-04 NOTE — H&P (View-Only) (Signed)
Vascular and Vein Specialist of [facility]   Patient name: Karl Walker MRN: [id number] DOB: [birth year] Sex: male     Chief Complaint  Patient presents with  . AAA    6 month f/u     HISTORY OF PRESENT ILLNESS: The patient is back today for followup of his abdominal aortic aneurysm. I initially met him in 2001 when he came to the emergency department with right flank and hip pain. At that time he had a 3.5 cm abdominal aortic aneurysm. His CT scan 6 months ago showed his aneurysm had increased to 4.6 cm. I felt that it was a rather large increase in size over a short period of time and therefore he is back again today with a repeat CT scan. He continues to be without abdominal or back pain. He continues to be medically managed for his hypertension and hypercholesterolemia. He has a history of myocardial infarction in 2007 which required coronary stenting. He still undergoes frequent basal cell cancer removal.  Past Medical History  Diagnosis Date  . Urinary frequency   . Shortness of breath   . Bronchitis   . Hypertension   . COPD (chronic obstructive pulmonary disease)   . Hypercholesterolemia   . MI (myocardial infarction) history of MI in 2007  . Cancer     basal cell ca on face    Past Surgical History  Procedure Date  . Hernia repair   . Coronary angioplasty with stent placement   . Skin surgery     History   Social History  . Marital Status: Married    Spouse Name: N/A    Number of Children: N/A  . Years of Education: N/A   Occupational History  . Not on file.   Social History Main Topics  . Smoking status: Current Some Day Smoker -- 0.5 packs/day    Types: Cigarettes  . Smokeless tobacco: Never Used  . Alcohol Use: Yes     rare  . Drug Use: No  . Sexually Active: No   Other Topics Concern  . Not on file   Social History Narrative  . No narrative on file    Family History  Problem Relation Age of Onset  . Stroke Brother     Allergies as of  06/09/2012 - Review Complete 06/09/2012  Allergen Reaction Noted  . Iodine      Current Outpatient Prescriptions on File Prior to Visit  Medication Sig Dispense Refill  . albuterol (PROVENTIL) (2.5 MG/3ML) 0.083% nebulizer solution Take 2.5 mg by nebulization every 6 (six) hours as needed. Congestion      . aspirin 81 MG tablet Take 81 mg by mouth daily.        . finasteride (PROSCAR) 5 MG tablet Take 5 mg by mouth daily.      Marland Kitchen losartan-hydrochlorothiazide (HYZAAR) 50-12.5 MG per tablet Take 1 tablet by mouth daily.        . niacin (NIASPAN) 500 MG CR tablet Take 500 mg by mouth at bedtime.      . simvastatin (ZOCOR) 40 MG tablet Take 40 mg by mouth every evening.      Marland Kitchen VITAMIN D, ERGOCALCIFEROL, PO Take 1 tablet by mouth daily.      Marland Kitchen dutasteride (AVODART) 0.5 MG capsule Take 0.5 mg by mouth daily.      . Tamsulosin HCl (FLOMAX) 0.4 MG CAPS Take one tablet once a day to facilitate stone passage.  5 capsule  0   No current facility-administered  medications on file prior to visit.     REVIEW OF SYSTEMS: No change from prior visit  PHYSICAL EXAMINATION:   Vital signs are BP 134/82  Pulse 96  Resp 16  Ht 5' 10.5" (1.791 m)  Wt 142 lb (64.411 kg)  BMI 20.09 kg/m2  SpO2 97% General: The patient appears their stated age. HEENT:  No gross abnormalities Pulmonary:  Non labored breathing Abdomen: Soft and non-tender. Aorta is nontender and easily palpable Musculoskeletal: There are no major deformities. Neurologic: No focal weakness or paresthesias are detected, Skin: There are no ulcer or rashes noted. Psychiatric: The patient has normal affect. Cardiovascular: There is a regular rate and rhythm without significant murmur appreciated. No carotid bruits. Palpable pedal pulses.   Diagnostic Studies CT scan was ordered for today. This shows a increase in the size of his aneurysm. Maximum diameter is now 5.1 cm. Chest CT shows a lobular soft tissue mass in the trachea just above  the carina. There is also a 5 mm nodule in the posterior medial left lower lobe  Assessment: Abdominal aortic aneurysm. Plan: The patient's aneurysm continues to increase in size at a rather faster rate. For that reason I think this needs to be repaired to prevent rupture. We went over the details of the procedure today with the patient and his wife. We talked about the complications which include access site problems, cardiopulmonary complications, intestinal ischemia, renal failure, and lower extremity complications. The patient wishes to proceed. I have scheduled his surgery for Friday, September 13 with a [facility] device  I will order a carotid ultrasound today for preoperative evaluation.  The patient is followed by [facility] heart vascular I will get formal cardiac clearance from them.  I am also going to send him to thoracic surgery for further input regarding the lesion at the carina and the pulmonary nodules.  I spent in excess of 60 minutes going over the patient's images and discussing the details of the procedure with the patient and his wife.  Jorge Ny, M.D. Vascular and Vein Specialists of [place] Office: [phone] Pager:  [phone]

## 2012-07-04 NOTE — Progress Notes (Signed)
Rt neck with "goose egg" size swellling noted.  Dr Chaney Malling aware.  States to provide 5 - 10 min hand pressure and cont to monitor.

## 2012-07-04 NOTE — Anesthesia Procedure Notes (Signed)
Procedure Name: Intubation Date/Time: 07/04/2012 9:00 AM Performed by: Nicholos Johns Pre-anesthesia Checklist: Patient identified, Emergency Drugs available, Suction available, Patient being monitored and Timeout performed Patient Re-evaluated:Patient Re-evaluated prior to inductionOxygen Delivery Method: Circle system utilized Preoxygenation: Pre-oxygenation with 100% oxygen Intubation Type: IV induction Ventilation: Mask ventilation without difficulty Laryngoscope Size: Mac and 4 Grade View: Grade I Tube type: Oral Tube size: 7.5 mm Number of attempts: 1 Airway Equipment and Method: Stylet Placement Confirmation: ETT inserted through vocal cords under direct vision,  positive ETCO2 and breath sounds checked- equal and bilateral Secured at: 22 cm Tube secured with: Tape Dental Injury: Teeth and Oropharynx as per pre-operative assessment

## 2012-07-04 NOTE — Op Note (Signed)
Vascular and Vein Specialists of [facility]  Patient name: Karl Walker MRN: [id number] DOB: 1932-10-02 Sex: male  07/04/2012 Pre-operative Diagnosis: Abdominal aortic aneurysm Post-operative diagnosis:  Same Surgeon:  Jorge Ny Assistants:  Betti Cruz Procedure:   #1 bilateral ultrasound-guided percutaneous access and   #2 catheter in aorta x2   #3 IVUS (intravascular ultrasound)   #4 Abdominal aortogram   #5 endovascular repair of abdominal aortic aneurysm (2 docking limbs) Anesthesia:  Gen. Blood Loss:  See anesthesia record Specimens:  None  Findings:  Complete exclusion Devices used:  Main body (primary left): Cook Zenith 24 x 96       Contralateral right: Cook Zenith 16 x 90       Ipsilateral left:  Cook Zenith 16 x 74  Indications:  The patient has had a progressively enlarging abdominal aortic aneurysm. He now needs size criteria for repair. Risks and benefits were discussed. All questions were answered. The patient recently underwent excision of a paratracheal mass. He is breathing better. There were no concerns regarding his airway based on his operation.  Procedure:  The patient was identified in the holding area and taken to [facility] OR ROOM 16  The patient was then placed supine on the table. general anesthesia was administered.  The patient was prepped and draped in the usual sterile fashion.  A time out was called and antibiotics were administered.  Ultrasound was used to evaluate both common femoral arteries. They were calcified but patent. A digital ultrasound image was acquired. A #11 blade was used to make a skin incision bilaterally. Both common femoral arteries were accessed under ultrasound guidance with an 18-gauge needle. An 035 wires were advanced without resistance. Pro-glide devices were deployed at the 11:00 and 1:00 position for pre-closure. 8 French sheaths were placed bilaterally. The patient was fully heparinized. 035 wires were advanced into the descending  thoracic aorta bilaterally. The IVUS catheter was run along the Amplatz wire on the left. I evaluated the infrarenal neck as well as the aortic bifurcation and iliac bifurcation. I got appropriate size measurements of the aorta at the level of the renal arteries. I selected a Cook Zenith 24 x 94 device. This was prepared on the back table. It was advanced up the left side and positioned at the level of the renal arteries. With a Omni flush catheter up the right side and aortogram was performed. The main body was then deployed with the tip of the device just below an accessory right renal artery. The contralateral gate was exposed. The gate was cannulated using a Omni flush catheter and Bentson wire. The catheter was able to be freely rotated within the main body, confirming successful cannulation. A left anterior oblique injection was performed to locate the right hypogastric artery. I used intravascular ultrasound to confirm the diameter at the level of the distal extent of the device. I selected a 16 x 90 Cook Zenith device and deployed this at the level of the right hypogastric artery. I then deployed the remaining portion of the ipsilateral limb. The top Was retrieved and the deployment system was removed and replaced by an 18 [place] sheath. The image intensifier was then rotated to a right anterior oblique. A retrograde sheath injection was used to confirm the location of the left hypogastric artery.  I selected a Cook Zenith 16 x 74 device. It was advanced through a 18 [place] sheath, and deployed at the level of the left hypogastric artery. A 32 mm Judeth Cornfield  balloon was used to mold the proximal and distal portions of the graft as well as the graft overlap. A completion injection was performed which showed successful exclusion of the aneurysm sac. There was a late type II endoleak. After these images I was satisfied with our results. The sheaths were removed over a Bentson wires, and the previously placed pro-glide  devices were cinched down. Both groins were hemostatic. 50 mg of protamine was a Optician, dispensing. The Pro-glide suture was then cut and the skin was closed with 4-0 Vicryl and the incision was covered with Dermabond. The patient had brisk posterior tibial and dorsalis pedis Doppler signals after the case which was similar to pre-procedure.    Disposition:  To PACU in stable condition.   Juleen China, M.D. [facility] Vein Specialists of [place] Office: [phone] Pager:  [phone]

## 2012-07-04 NOTE — OR Nursing (Signed)
Fluoro time: 12 minutes; 85.6 mGy

## 2012-07-04 NOTE — Anesthesia Preprocedure Evaluation (Addendum)
Anesthesia Evaluation  Patient identified by MRN, date of birth, ID band Patient awake    Reviewed: Allergy & Precautions, H&P , NPO status , Patient's Chart, lab work & pertinent test results  Airway Mallampati: II  Neck ROM: full    Dental  (+) Teeth Intact and Dental Advisory Given   Pulmonary shortness of breath, COPD COPD inhaler, Current Smoker,          Cardiovascular hypertension, Pt. on medications + CAD, + Past MI and + Cardiac Stents     Neuro/Psych negative neurological ROS  negative psych ROS   GI/Hepatic negative GI ROS, Neg liver ROS,   Endo/Other  negative endocrine ROS  Renal/GU negative Renal ROS     Musculoskeletal negative musculoskeletal ROS (+)   Abdominal   Peds  Hematology negative hematology ROS (+)   Anesthesia Other Findings   Reproductive/Obstetrics                         Anesthesia Physical Anesthesia Plan  ASA: III  Anesthesia Plan: General   Post-op Pain Management:    Induction: Intravenous  Airway Management Planned: Oral ETT  Additional Equipment: Arterial line and CVP  Intra-op Plan:   Post-operative Plan: Extubation in OR  Informed Consent: I have reviewed the patients History and Physical, chart, labs and discussed the procedure including the risks, benefits and alternatives for the proposed anesthesia with the patient or authorized representative who has indicated his/her understanding and acceptance.   Dental advisory given  Plan Discussed with: CRNA and Surgeon  Anesthesia Plan Comments:        Anesthesia Quick Evaluation

## 2012-07-04 NOTE — Preoperative (Signed)
Beta Blockers   Reason not to administer Beta Blockers:Not Applicable 

## 2012-07-04 NOTE — Interval H&P Note (Signed)
History and Physical Interval Note:  07/04/2012 7:33 AM  Karl Walker  has presented today for surgery, with the diagnosis of Abdominal Aortic Aneurysm  The various methods of treatment have been discussed with the patient and family. After consideration of risks, benefits and other options for treatment, the patient has consented to  Procedure(s) (LRB) with comments: ABDOMINAL AORTIC ENDOVASCULAR STENT GRAFT (N/A) - Endovascular Abdominal  Aortic Aneurysm Repair - "Cook"  as a surgical intervention .  The patient's history has been reviewed, patient examined, no change in status, stable for surgery.  I have reviewed the patient's chart and labs.  Questions were answered to the patient's satisfaction.     BRABHAM IV, V. WELLS

## 2012-07-04 NOTE — Progress Notes (Signed)
Received report

## 2012-07-04 NOTE — Anesthesia Postprocedure Evaluation (Signed)
Anesthesia Post Note  Patient: Karl Walker  Procedure(s) Performed: Procedure(s) (LRB): ABDOMINAL AORTIC ENDOVASCULAR STENT GRAFT (N/A)  Anesthesia type: General  Patient location: PACU  Post pain: Pain level controlled and Adequate analgesia  Post assessment: Post-op Vital signs reviewed, Patient's Cardiovascular Status Stable, Respiratory Function Stable, Patent Airway and Pain level controlled  Last Vitals:  Filed Vitals:   07/04/12 1156  BP:   Pulse: 63  Temp: 36.1 C  Resp: 17    Post vital signs: Reviewed and stable  Level of consciousness: awake, alert  and oriented  Complications: No apparent anesthesia complications

## 2012-07-05 LAB — BASIC METABOLIC PANEL
CO2: 28 mEq/L (ref 19–32)
Chloride: 99 mEq/L (ref 96–112)
GFR calc Af Amer: >90 mL/min (ref >90)
Potassium: 3.8 mEq/L (ref 3.5–5.1)
Sodium: 136 mEq/L (ref 135–145)

## 2012-07-05 LAB — CBC
MCV: 92.1 fL (ref 78.0–100.0)
Platelets: 168 10*3/uL (ref 150–400)
RBC: 4.66 MIL/uL (ref 4.22–5.81)
RDW: 14.4 % (ref 11.5–15.5)
WBC: 10.7 10*3/uL — ABNORMAL HIGH (ref 4.0–10.5)

## 2012-07-05 MED ORDER — OXYCODONE-ACETAMINOPHEN 5-325 MG PO TABS: 1.0000 | ORAL_TABLET | Freq: Four times a day (QID) | ORAL | Status: AC | PRN

## 2012-07-05 NOTE — Discharge Summary (Signed)
Vascular and Vein Specialists Discharge Summary   Patient ID:  Karl Walker MRN: [id number] DOB/AGE: [birth year] 78 y.o.  Admit date: 07/04/2012 Discharge date: 07/05/2012 Date of Surgery: 07/04/2012 Surgeon: Surgeon(s): Nada Libman, MD Pryor Ochoa, MD  Admission Diagnosis: Abdominal Aortic Aneurysm  Discharge Diagnoses:  Abdominal Aortic Aneurysm  Secondary Diagnoses: Past Medical History  Diagnosis Date  . Urinary frequency   . Bronchitis   . COPD (chronic obstructive pulmonary disease)   . Hypercholesterolemia     takes Niacin nightly  . Cancer     basal cell ca on face  . CAD (coronary artery disease) stent in 2007  . Tracheal mass   . Hypertension     takes Hyzaar daily  . MI (myocardial infarction) 1996  . Chest congestion     occasionally per pt and wife  . Shortness of breath     with exertion  . Pneumonia     last time 74yrs ago  . Bruises easily   . Dry skin   . Hx of colonic polyps   . Enlarged prostate     takes Proscar  . Abdominal aneurysm     scheduled procedure to take care of this 07/04/12 with Dr.Josiane Labine    Procedure(s): ABDOMINAL AORTIC ENDOVASCULAR STENT GRAFT  Discharged Condition: good  HPI:  The patient is back today for followup of his abdominal aortic aneurysm. I initially met him in 2001 when he came to the emergency department with right flank and hip pain. At that time he had a 3.5 cm abdominal aortic aneurysm. His CT scan 6 months ago showed his aneurysm had increased to 4.6 cm. I felt that it was a rather large increase in size over a short period of time and therefore he is back again today with a repeat CT scan. He continues to be without abdominal or back pain. He continues to be medically managed for his hypertension and hypercholesterolemia. He has a history of myocardial infarction in 2007 which required coronary stenting. He still undergoes frequent basal cell cancer removal.  This patient does have urinary retention  problems and self catheters at home PRN.   Hospital Course:  Karl Walker is a 78 y.o. male is S/P Bilateral groin incisions Procedure(s): ABDOMINAL AORTIC ENDOVASCULAR STENT GRAFT Extubated: POD # 0 Post-op wounds clean, dry, intact or healing well Pt. Ambulating, voiding and taking PO diet without difficulty. Pt pain controlled with PO pain meds. Labs as below Complications:none  Consults:     Significant Diagnostic Studies: CBC Lab Results  Component Value Date   WBC 10.7* 07/05/2012   HGB 14.4 07/05/2012   HCT 42.9 07/05/2012   MCV 92.1 07/05/2012   PLT 168 07/05/2012    BMET    Component Value Date/Time   NA 136 07/05/2012 0540   K 3.8 07/05/2012 0540   CL 99 07/05/2012 0540   CO2 28 07/05/2012 0540   GLUCOSE 152* 07/05/2012 0540   BUN 13 07/05/2012 0540   CREATININE 0.75 07/05/2012 0540   CREATININE 0.95 06/04/2012 0928   CALCIUM 8.9 07/05/2012 0540   GFRNONAA 86* 07/05/2012 0540   GFRAA >90 07/05/2012 0540   COAG Lab Results  Component Value Date   INR 1.19 07/04/2012   INR 1.04 07/02/2012   INR 1.12 06/18/2012     Disposition:  Discharge to :Home Discharge Orders    Future Appointments: Provider: Department: Dept Phone: Center:   08/04/2012 1:15 PM Nada Libman, MD [facility] [phone] [name] [name]  Home Medication Instructions [id number]   Printed on:07/05/12 8119  Medication Information                    aspirin 81 MG tablet Take 81 mg by mouth daily.             losartan-hydrochlorothiazide (HYZAAR) 50-12.5 MG per tablet Take 1 tablet by mouth daily.             VITAMIN D, ERGOCALCIFEROL, PO Take 1 tablet by mouth daily.           albuterol (PROVENTIL) (2.5 MG/3ML) 0.083% nebulizer solution Take 2.5 mg by nebulization every 6 (six) hours as needed. Congestion           niacin (NIASPAN) 500 MG CR tablet Take 500 mg by mouth at bedtime.           finasteride (PROSCAR) 5 MG tablet Take 5 mg by mouth daily.             Verbal and written Discharge instructions given to the patient. Wound care per Discharge AVS   Signed: Clinton Gallant [facility] 07/05/2012, 7:18 AM    agree with above  Durene Cal

## 2012-07-05 NOTE — Progress Notes (Signed)
Discharge instructions given to pt and family both verbalized understanding. Forms signed and copies given. PIV removed-tip intact. Groin sites C/D/I-no signs of infection.  Pt request to in and out cath self-pt does this at home to self. PA notified.

## 2012-07-05 NOTE — Progress Notes (Addendum)
VASCULAR & VEIN SPECIALISTS OF [place]  Post-op EVAR Date of Surgery: 07/04/2012 Surgeon: Surgeon(s): Nada Libman, MD Pryor Ochoa, MD POD: 1 Day Post-Op Device:   History of Present Illness  Karl Walker is a 78 y.o. male who is s/p EVAR. The patient denies back pain; denies abdominal pain; denies lower extremity pain.  He is Ambulating and taking PO well without nausea or vomiting. Pt. has not voided with foley out  IMAGING: Dg Chest Portable 1 View  07/04/2012  *RADIOLOGY REPORT*  Clinical Data: Status post stent graft placement  PORTABLE CHEST - 1 VIEW  Comparison: 06/25/2012  Findings: A right internal jugular CVP is in place with the tip located overlying the mid superior vena cava.  The lung fields are hyperexpanded compatible with underlying COPD.  Taking this into consideration heart size is mildly enlarged.  Stable degree of aortic ectasia of aortic calcification are noted.  A mild increase in interstitial prominence is seen diffusely.  No pleural fluid or interstitial septal lines are identified but this may represent very early or incipient interstitial edema. Lung volumes are diminished today relative to prior exams and this may accentuate the interstitial markings as well.  No focal infiltrates or overt congestive failure noted.  Bony structures appear intact.  IMPRESSION: Stable cardiomegaly and aortic ectasia.  Mild diffuse increase in interstitial markings may indicate early or incipient interstitial edema.  No overt pulmonary edema or pleural fluid seen.   Original Report Authenticated By: Bertha Stakes, M.D.    Dg Abd Portable 1v  07/04/2012  *RADIOLOGY REPORT*  Clinical Data: Stent graft placement  PORTABLE ABDOMEN - 1 VIEW  Comparison: CT 05/2012  Findings: The patient is status post stent graft placement which is positioned between from L1 vertebral level down into the iliac bifurcation. The inferior aspect of the stent graft is not included in the exam.   Contrast is identified in both renal collecting systems which appear normal.  The pelvis below the level of the upper iliac crest is not included in the film.  The visualized portion of the bowel gas pattern appears unremarkable.  The lung bases appear clear.  IMPRESSION: Post aortic stent graft placement.  No adverse features noted in the visualized portion of the abdomen.   Original Report Authenticated By: Bertha Stakes, M.D.     Significant Diagnostic Studies: CBC Lab Results  Component Value Date   WBC 10.7* 07/05/2012   HGB 14.4 07/05/2012   HCT 42.9 07/05/2012   MCV 92.1 07/05/2012   PLT 168 07/05/2012     BMET    Component Value Date/Time   NA 136 07/05/2012 0540   K 3.8 07/05/2012 0540   CL 99 07/05/2012 0540   CO2 28 07/05/2012 0540   GLUCOSE 152* 07/05/2012 0540   BUN 13 07/05/2012 0540   CREATININE 0.75 07/05/2012 0540   CREATININE 0.95 06/04/2012 0928   CALCIUM 8.9 07/05/2012 0540   GFRNONAA 86* 07/05/2012 0540   GFRAA >90 07/05/2012 0540    COAG Lab Results  Component Value Date   INR 1.19 07/04/2012   INR 1.04 07/02/2012   INR 1.12 06/18/2012   No results found for this basename: PTT     I/O last 3 completed shifts: In: 3860 [P.O.:60; I.V.:3700; IV Piggyback:100] Out: 1775 [Urine:1750; Blood:25] No data found.   Physical Examination  BP Readings from Last 3 Encounters:  07/05/12 160/76  07/05/12 160/76  07/02/12 116/66   Temp Readings from Last 3 Encounters:  07/05/12 98.9 F (37.2 C) Oral  07/05/12 98.9 F (37.2 C) Oral  07/02/12 98.6 F (37 C)    SpO2 Readings from Last 3 Encounters:  07/05/12 95%  07/05/12 95%  07/02/12 95%   Pulse Readings from Last 3 Encounters:  07/05/12 76  07/05/12 76  07/02/12 95    General: A&O x 3, WDWN male in NAD Gait: Normal Pulmonary: normal non-labored breathing  Cardiac: RRR Abdomen: soft, NT, NABS Bilateral groin wounds: clean, dry, intact, without hematoma Vascular Exam/Pulses:palp DP/PT pulses  bilateral  Extremities without ischemic changes, no Gangrene , no cellulitis; no open wounds;   Neurologic: A&O X 3; Appropriate Affect  Assessment: Karl Walker is a 78 y.o. male who is 1 Day Post-Op EVAR.  Pt is doing well with no complaints  Plan: Home  The importance of surveillance of the endograft was discussed with the patient  A CTA of abdomen and pelvis will be scheduled for one month to assess for endoleak.  The patient will follow up with [place] in one month with these studies.   SignedMosetta Pigeon [phone] 07/05/2012 7:15 AM.   Agree Stable for d/c  Durene Cal

## 2012-07-07 ENCOUNTER — Telehealth: Payer: Self-pay | Admitting: Surgery

## 2012-07-07 NOTE — Telephone Encounter (Addendum)
Message copied by Shari Prows on Mon Jul 07, 2012  9:25 AM ------      Message from: Melene Plan      Created: Fri Jul 04, 2012 11:58 AM                   ----- Message -----         From: Nada Libman, MD         Sent: 07/04/2012  11:54 AM           To: Reuel Derby, Melene Plan, RN            07/04/2012, the patient had the following procedures:                  Procedure:   #1 bilateral ultrasound-guided percutaneous access         #2 catheter in aorta x2        #3 IVUS (intravascular ultrasound)        #4 Abdominal aortogram        #5 endovascular repair of abdominal aortic                                   aneurysm (2 docking limbs)                  Assistants:  Betti Cruz                  The patient needs to come back to see me in one month with a CT angiogram of the abdomen and pelvis  I scheduled the patient an appt on 08/04/12 at 1:15pm w/ vwb. He is scheduled to have a cta prior at [facility]. I left message for the patient and mailed pw to pt. awt

## 2012-08-01 ENCOUNTER — Encounter: Payer: Self-pay | Admitting: Vascular Surgery

## 2012-08-04 ENCOUNTER — Ambulatory Visit (INDEPENDENT_AMBULATORY_CARE_PROVIDER_SITE_OTHER): Payer: Medicare HMO | Admitting: Surgery

## 2012-08-04 ENCOUNTER — Ambulatory Visit
Admission: RE | Admit: 2012-08-04 | Discharge: 2012-08-04 | Disposition: A | Discharge status: Home / Self Care | Payer: Medicare HMO | Source: Ambulatory Visit | Attending: Surgery | Admitting: Surgery

## 2012-08-04 ENCOUNTER — Encounter: Payer: Self-pay | Admitting: Surgery

## 2012-08-04 VITALS — BP 145/68 | HR 83 | Temp 98.1°F | Resp 18 | Ht 70.5 in | Wt 145.4 lb

## 2012-08-04 DIAGNOSIS — I714 Abdominal aortic aneurysm, without rupture, unspecified: Secondary | ICD-10-CM

## 2012-08-04 DIAGNOSIS — Z48812 Encounter for surgical aftercare following surgery on the circulatory system: Secondary | ICD-10-CM

## 2012-08-04 MED ORDER — IOHEXOL 350 MG/ML SOLN
80.0000 mL | Freq: Once | INTRAVENOUS | Status: AC | PRN
  Administered 2012-08-04: 80 mL via INTRAVENOUS

## 2012-08-04 NOTE — Progress Notes (Signed)
The patient is back today for followup. He is status post endovascular aneurysm repair performed on 07/04/2012. This was done with a [facility] device, performed percutaneously. The patient's postoperative course was uncomplicated. He was discharged home on postoperative day one. The week prior to his procedure he had excision of a paratracheal mass by Dr. Dorris Fetch.  On examination today both groins have healed nicely. He has no abdominal pain.  I have reviewed his CT scan which has not yet been over read by radiology. The stent graft appears to be in good position without evidence of endoleak.  I will plan on having the patient come back to see me in 6 months with a abdominal ultrasound

## 2012-08-04 NOTE — Addendum Note (Signed)
Addended by: Sharee Pimple on: 08/04/2012 02:51 PM   Modules accepted: Orders

## 2012-08-15 ENCOUNTER — Ambulatory Visit (INDEPENDENT_AMBULATORY_CARE_PROVIDER_SITE_OTHER): Payer: Medicare HMO | Admitting: Urology

## 2012-08-15 DIAGNOSIS — N529 Male erectile dysfunction, unspecified: Secondary | ICD-10-CM

## 2012-08-15 DIAGNOSIS — R82998 Other abnormal findings in urine: Secondary | ICD-10-CM

## 2012-08-15 DIAGNOSIS — N403 Nodular prostate with lower urinary tract symptoms: Secondary | ICD-10-CM

## 2012-11-03 IMAGING — CT CT CTA ABD/PEL W/CM AND/OR W/O CM
3 of 10 series · 12 of 36 positions shown, 17 images · IV contrast (80CC OMNI 350)
Comparison: 06/09/2012

CLINICAL DATA: Abdominal aortic aneurysm status post aortic stent
graft repair.

CT ANGIOGRAPHY ABDOMEN AND PELVIS
TECHNIQUE: Multidetector CT imaging of the abdomen and pelvis was
performed using the standard protocol during bolus administration
of intravenous contrast.  Multiplanar reconstructed images
including MIPs were obtained and reviewed to evaluate the vascular
anatomy.
Contrast: 80mL OMNIPAQUE IOHEXOL 350 MG/ML SOLN

[Series 4: angio · axial · 0.78mm/px · z∈[-411,-131]mm · 6 of 226 slices shown, 11 images]
[im 33/226  soft-tissue]
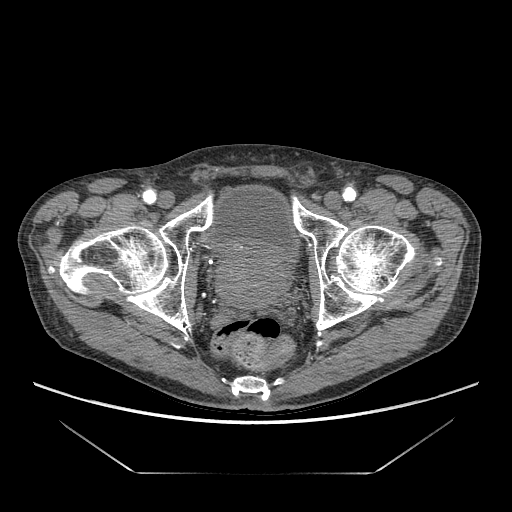
[im 33/226  bone]
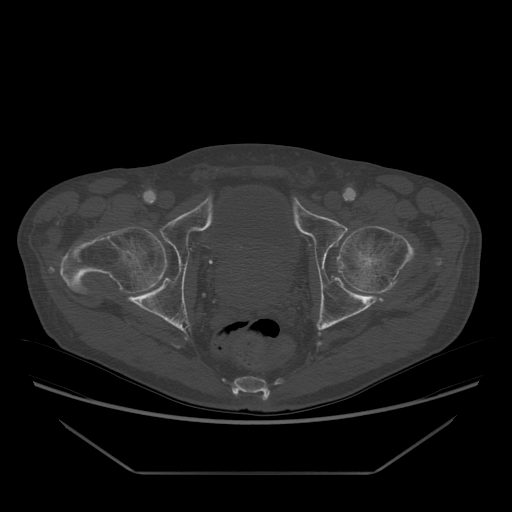
[im 65/226  soft-tissue]
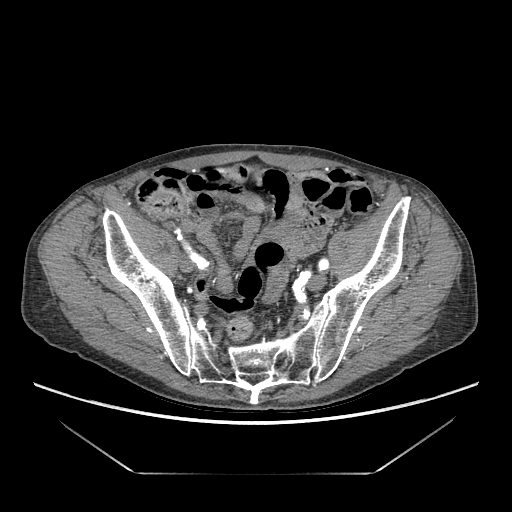
[im 97/226  soft-tissue]
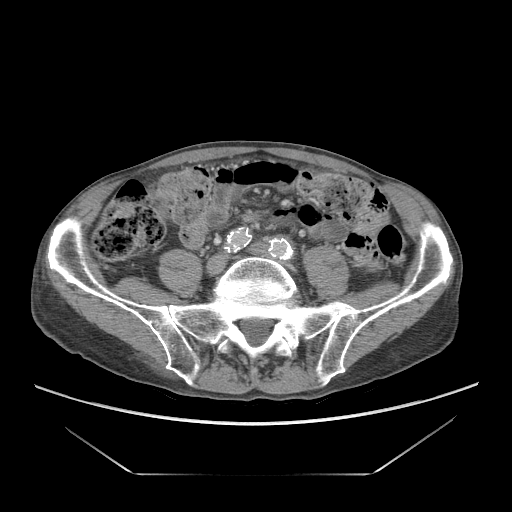
[im 97/226  lung]
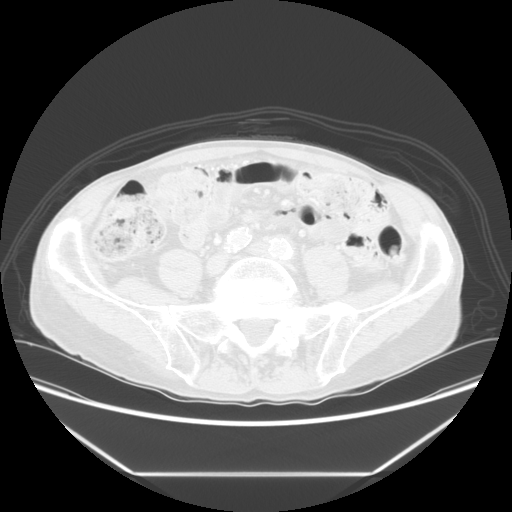
[im 129/226  soft-tissue]
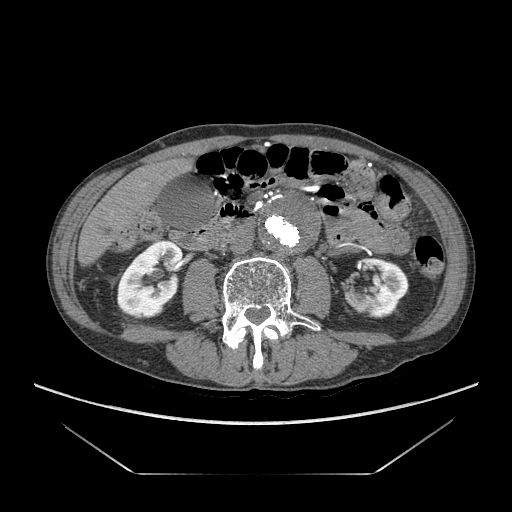
[im 129/226  lung]
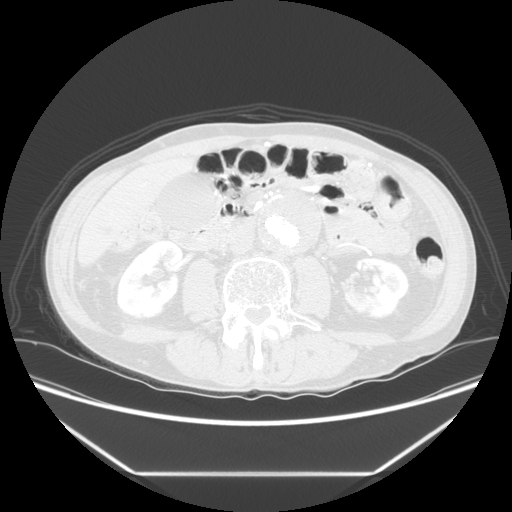
[im 161/226  soft-tissue]
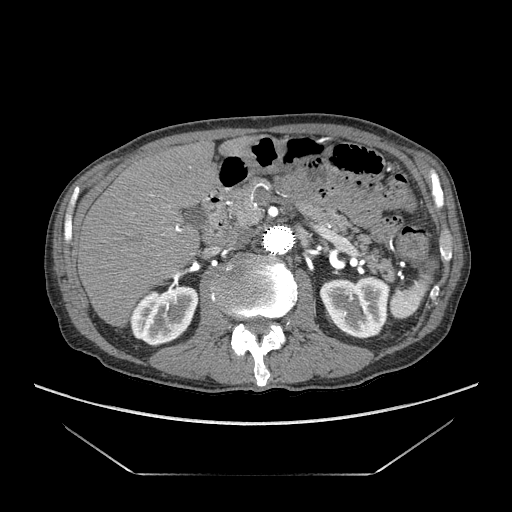
[im 161/226  lung]
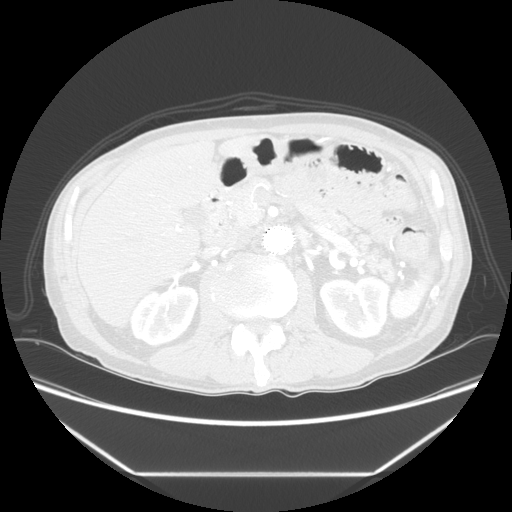
[im 193/226  soft-tissue]
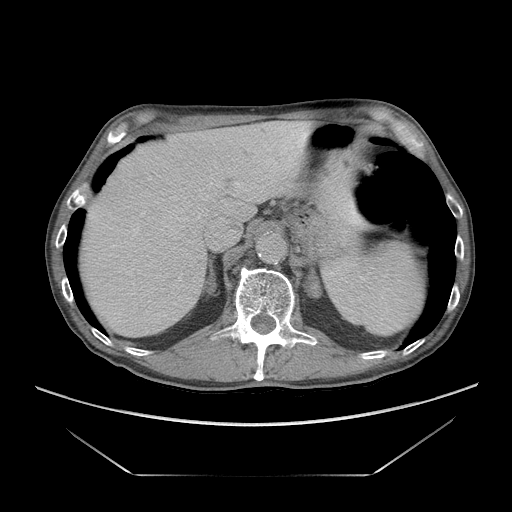
[im 193/226  lung]
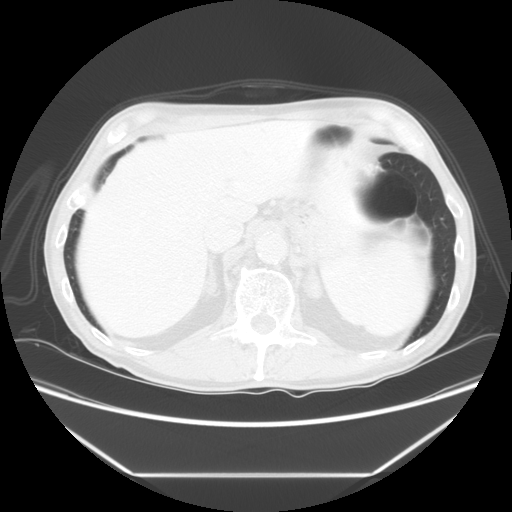

[Series 602: sagittal body · sagittal · 0.85mm/px · 3 of 150 slices shown (1 of 2)]
[im 38/150  soft-tissue]
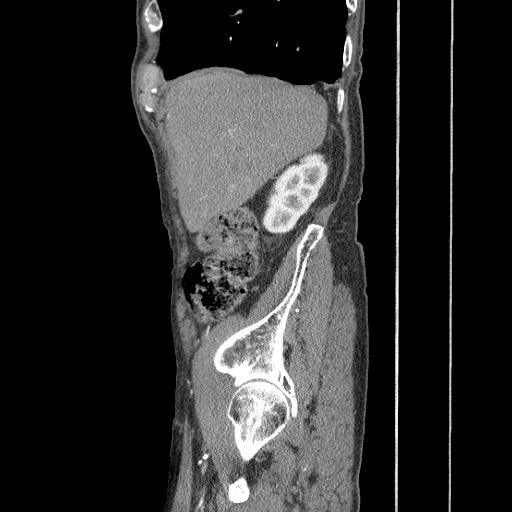
[im 75/150  soft-tissue]
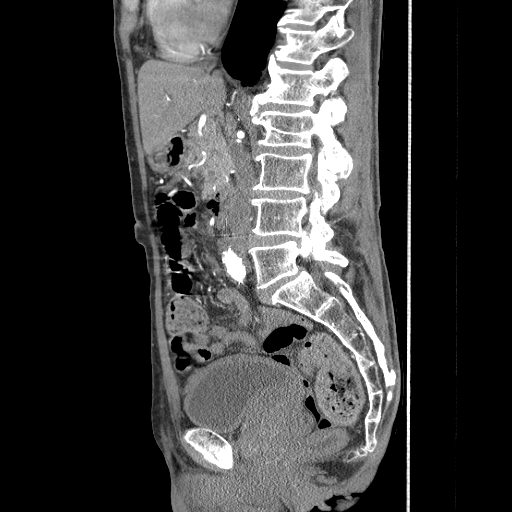
[im 112/150  soft-tissue]
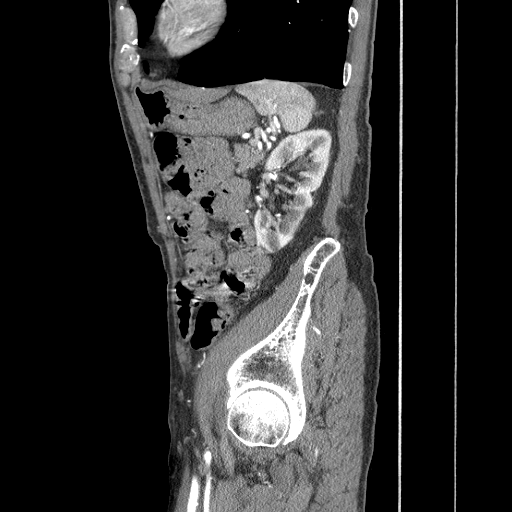

[Series 605: sagittal body · sagittal · 0.78mm/px · 3 of 145 slices shown (2 of 2)]
[im 37/145  soft-tissue]
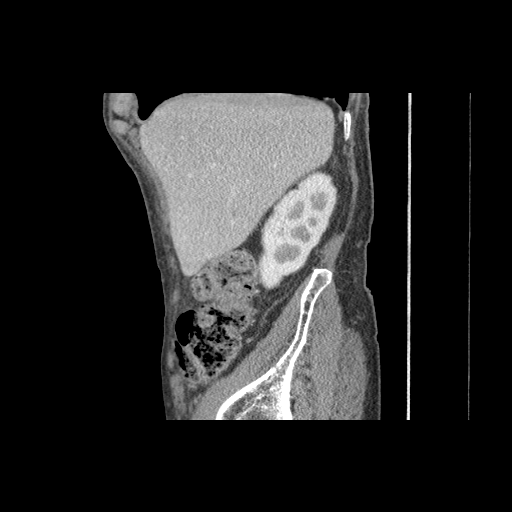
[im 73/145  soft-tissue]
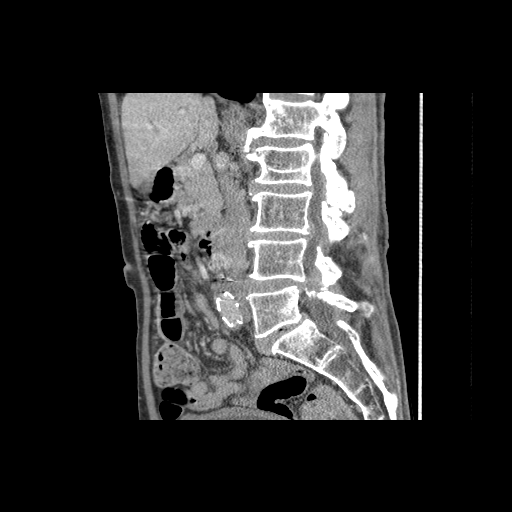
[im 109/145  soft-tissue]
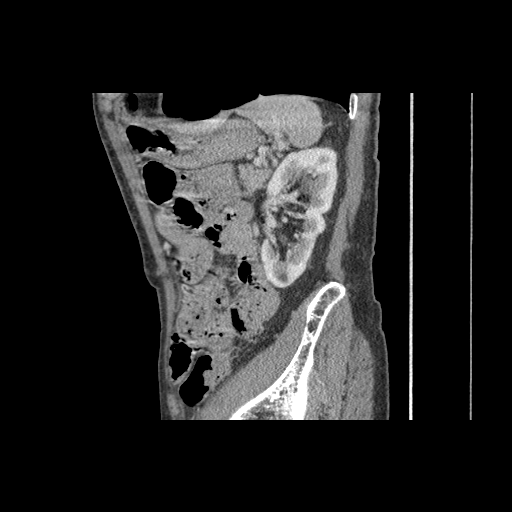

[12 of 36 positions shown; findings below may reference images not displayed]

FINDINGS: The patient is status post aortic stent graft repair to
treat the infrarenal abdominal aortic atherosclerotic aneurysm.
Stent graft lumen and iliac limbs are patent.  Stent graft extends
from below the SMA into the common iliac arteries bilaterally.
Native aneurysm sac diameter measures 5.2 x 5.1 cm which is stable
appearance.  No evidence of Endo leak demonstrated.

Stable narrowing of the celiac origin without occlusion.  SMA, and
renal arteries appear patent.  IMA origin is occluded.  IMA
branches peripherally are reconstituted.

Nonvascular findings:  Chronic basilar emphysema.  Scarring in the
right lower lobe.  No pericardial or pleural effusion.

Stable hepatic and renal cysts as before.  No biliary dilatation.
Gallbladder, biliary system, pancreas, spleen, kidneys are stable
and demonstrate no acute process.

Mild thickening of the adrenal glands compatible with adenomatous
change versus hyperplasia.  Stable appearance.

Negative for bowel obstruction, dilatation, ileus, or free air.  No
abdominal free fluid, fluid collection, hemorrhage, hematoma, or
adenopathy.

The prostate gland is markedly enlarged impressing upon the bladder
base measuring 5.4 x 6.3 cm.  Bladder dome diverticulum noted.  No
acute distal bowel process.

Degenerative changes of the spine diffusely.

 Review of the MIP images confirms the above findings.
IMPRESSION: Status post aortic stent graft repair of the infrarenal fusiform
atherosclerotic abdominal aneurysm.  Stent graft lumen appears
patent.  Negative for Endo leak.  Stable native aneurysm sac
diameter.

Stable nonvascular findings in the lower chest and abdomen as
described.

## 2012-12-16 ENCOUNTER — Ambulatory Visit (HOSPITAL_COMMUNITY)
Admission: RE | Admit: 2012-12-16 | Discharge: 2012-12-16 | Disposition: A | Discharge status: Home / Self Care | Payer: Medicare HMO | Source: Ambulatory Visit | Attending: Family Medicine | Admitting: Family Medicine

## 2012-12-16 ENCOUNTER — Other Ambulatory Visit (HOSPITAL_COMMUNITY): Payer: Self-pay | Admitting: Family Medicine

## 2012-12-16 DIAGNOSIS — M545 Low back pain, unspecified: Secondary | ICD-10-CM | POA: Insufficient documentation

## 2012-12-16 DIAGNOSIS — W19XXXA Unspecified fall, initial encounter: Secondary | ICD-10-CM | POA: Insufficient documentation

## 2012-12-16 DIAGNOSIS — IMO0002 Reserved for concepts with insufficient information to code with codable children: Secondary | ICD-10-CM | POA: Insufficient documentation

## 2012-12-18 ENCOUNTER — Emergency Department (HOSPITAL_COMMUNITY)
Admission: EM | Admit: 2012-12-18 | Discharge: 2012-12-18 | Disposition: A | Discharge status: Home / Self Care | Payer: Medicare HMO | Attending: Emergency Medicine | Admitting: Emergency Medicine

## 2012-12-18 ENCOUNTER — Emergency Department (HOSPITAL_COMMUNITY): Payer: Medicare HMO

## 2012-12-18 ENCOUNTER — Encounter (HOSPITAL_COMMUNITY): Payer: Self-pay | Admitting: *Deleted

## 2012-12-18 DIAGNOSIS — N400 Enlarged prostate without lower urinary tract symptoms: Secondary | ICD-10-CM | POA: Insufficient documentation

## 2012-12-18 DIAGNOSIS — Y929 Unspecified place or not applicable: Secondary | ICD-10-CM | POA: Insufficient documentation

## 2012-12-18 DIAGNOSIS — N390 Urinary tract infection, site not specified: Secondary | ICD-10-CM | POA: Insufficient documentation

## 2012-12-18 DIAGNOSIS — Y939 Activity, unspecified: Secondary | ICD-10-CM | POA: Insufficient documentation

## 2012-12-18 DIAGNOSIS — Z79899 Other long term (current) drug therapy: Secondary | ICD-10-CM | POA: Insufficient documentation

## 2012-12-18 DIAGNOSIS — I252 Old myocardial infarction: Secondary | ICD-10-CM | POA: Insufficient documentation

## 2012-12-18 DIAGNOSIS — Z859 Personal history of malignant neoplasm, unspecified: Secondary | ICD-10-CM | POA: Insufficient documentation

## 2012-12-18 DIAGNOSIS — E7800 Pure hypercholesterolemia, unspecified: Secondary | ICD-10-CM | POA: Insufficient documentation

## 2012-12-18 DIAGNOSIS — Z7982 Long term (current) use of aspirin: Secondary | ICD-10-CM | POA: Insufficient documentation

## 2012-12-18 DIAGNOSIS — Z9861 Coronary angioplasty status: Secondary | ICD-10-CM | POA: Insufficient documentation

## 2012-12-18 DIAGNOSIS — Z8679 Personal history of other diseases of the circulatory system: Secondary | ICD-10-CM | POA: Insufficient documentation

## 2012-12-18 DIAGNOSIS — K5900 Constipation, unspecified: Secondary | ICD-10-CM | POA: Insufficient documentation

## 2012-12-18 DIAGNOSIS — I2510 Atherosclerotic heart disease of native coronary artery without angina pectoris: Secondary | ICD-10-CM | POA: Insufficient documentation

## 2012-12-18 DIAGNOSIS — Z860100 Personal history of colon polyps, unspecified: Secondary | ICD-10-CM | POA: Insufficient documentation

## 2012-12-18 DIAGNOSIS — I10 Essential (primary) hypertension: Secondary | ICD-10-CM | POA: Insufficient documentation

## 2012-12-18 DIAGNOSIS — F17200 Nicotine dependence, unspecified, uncomplicated: Secondary | ICD-10-CM | POA: Insufficient documentation

## 2012-12-18 DIAGNOSIS — Z872 Personal history of diseases of the skin and subcutaneous tissue: Secondary | ICD-10-CM | POA: Insufficient documentation

## 2012-12-18 DIAGNOSIS — Z8701 Personal history of pneumonia (recurrent): Secondary | ICD-10-CM | POA: Insufficient documentation

## 2012-12-18 DIAGNOSIS — R209 Unspecified disturbances of skin sensation: Secondary | ICD-10-CM | POA: Insufficient documentation

## 2012-12-18 DIAGNOSIS — J4489 Other specified chronic obstructive pulmonary disease: Secondary | ICD-10-CM | POA: Insufficient documentation

## 2012-12-18 DIAGNOSIS — S32009A Unspecified fracture of unspecified lumbar vertebra, initial encounter for closed fracture: Secondary | ICD-10-CM | POA: Insufficient documentation

## 2012-12-18 DIAGNOSIS — W1809XA Striking against other object with subsequent fall, initial encounter: Secondary | ICD-10-CM | POA: Insufficient documentation

## 2012-12-18 LAB — HEPATIC FUNCTION PANEL
Albumin: 3.9 g/dL (ref 3.5–5.2)
Indirect Bilirubin: 0.5 mg/dL (ref 0.3–0.9)
Total Protein: 7.5 g/dL (ref 6.0–8.3)

## 2012-12-18 LAB — URINALYSIS, ROUTINE W REFLEX MICROSCOPIC
Nitrite: NEGATIVE
Specific Gravity, Urine: 1.025 (ref 1.005–1.030)
Urobilinogen, UA: 0.2 mg/dL (ref 0.0–1.0)
pH: 6 (ref 5.0–8.0)

## 2012-12-18 LAB — CBC WITH DIFFERENTIAL/PLATELET
Basophils Absolute: 0.1 10*3/uL (ref 0.0–0.1)
Basophils Relative: 1 % (ref 0–1)
Eosinophils Absolute: 0.3 10*3/uL (ref 0.0–0.7)
Eosinophils Relative: 3 % (ref 0–5)
Lymphocytes Relative: 13 % (ref 12–46)
MCHC: 33.9 g/dL (ref 30.0–36.0)
MCV: 92.7 fL (ref 78.0–100.0)
Platelets: 224 10*3/uL (ref 150–400)
RDW: 14.1 % (ref 11.5–15.5)
WBC: 11.6 10*3/uL — ABNORMAL HIGH (ref 4.0–10.5)

## 2012-12-18 LAB — LIPASE, BLOOD: Lipase: 23 U/L (ref 11–59)

## 2012-12-18 LAB — BASIC METABOLIC PANEL
CO2: 33 mEq/L — ABNORMAL HIGH (ref 19–32)
Calcium: 10.2 mg/dL (ref 8.4–10.5)
Creatinine, Ser: 0.94 mg/dL (ref 0.50–1.35)
GFR calc Af Amer: >90 mL/min (ref >90)
GFR calc non Af Amer: 78 mL/min — ABNORMAL LOW (ref >90)
Sodium: 137 mEq/L (ref 135–145)

## 2012-12-18 LAB — URINE MICROSCOPIC-ADD ON

## 2012-12-18 MED ORDER — DEXTROSE 5 % IV SOLN
1.0000 g | Freq: Once | INTRAVENOUS | Status: AC
  Administered 2012-12-18: 1 g via INTRAVENOUS
  Filled 2012-12-18: qty 10

## 2012-12-18 MED ORDER — CEPHALEXIN 500 MG PO CAPS: 500.0000 mg | ORAL_CAPSULE | Freq: Four times a day (QID) | ORAL | Status: DC

## 2012-12-18 MED ORDER — ONDANSETRON HCL 4 MG/2ML IJ SOLN
4.0000 mg | Freq: Once | INTRAMUSCULAR | Status: AC
  Administered 2012-12-18: 4 mg via INTRAVENOUS
  Filled 2012-12-18: qty 2

## 2012-12-18 MED ORDER — IOHEXOL 300 MG/ML  SOLN
50.0000 mL | Freq: Once | INTRAMUSCULAR | Status: AC | PRN
  Administered 2012-12-18: 50 mL via ORAL

## 2012-12-18 MED ORDER — SODIUM CHLORIDE 0.9 % IV BOLUS (SEPSIS)
250.0000 mL | Freq: Once | INTRAVENOUS | Status: AC
  Administered 2012-12-18: 1000 mL via INTRAVENOUS

## 2012-12-18 MED ORDER — IOHEXOL 300 MG/ML  SOLN
100.0000 mL | Freq: Once | INTRAMUSCULAR | Status: AC | PRN
  Administered 2012-12-18: 100 mL via INTRAVENOUS

## 2012-12-18 MED ORDER — SODIUM CHLORIDE 0.9 % IV SOLN: INTRAVENOUS | Status: DC

## 2012-12-18 MED ORDER — DOCUSATE SODIUM 100 MG PO CAPS: 100.0000 mg | ORAL_CAPSULE | Freq: Two times a day (BID) | ORAL | Status: DC

## 2012-12-18 MED ORDER — HYDROCODONE-ACETAMINOPHEN 5-325 MG PO TABS: 1.0000 | ORAL_TABLET | Freq: Four times a day (QID) | ORAL | Status: DC | PRN

## 2012-12-18 NOTE — ED Provider Notes (Signed)
History     CSN: [id number]  Arrival date & time 12/18/12  1122   First MD Initiated Contact with Patient 12/18/12 1512      Chief Complaint  Patient presents with  . Back Pain    (Consider location/radiation/quality/duration/timing/severity/associated sxs/prior treatment) Patient is a 79 y.o. male presenting with back pain. The history is provided by the patient and the spouse.  Back Pain Associated symptoms: numbness   Associated symptoms: no abdominal pain, no chest pain, no dysuria, no fever, no headaches and no weakness    Patient status post a fall landing on his buttocks approximately 10 days ago. Has had lower lumbar back pain since that injury. Seen by his primary care doctor 2 days ago with x-rays of the coccyx and sacral area which read as negative. The pain is now more on the right side has some radiation of numbness into the anterior part of the right thigh. Pain described as 8/10. Patient also has not had a bowel movement in 6 days but no nausea or vomiting no abdominal distention. No straining to have a bowel movement. Has tried some Fleet's enemas at home without any success. The back pain is described as sharp.  Past Medical History  Diagnosis Date  . Urinary frequency   . Bronchitis   . COPD (chronic obstructive pulmonary disease)   . Hypercholesterolemia     takes Niacin nightly  . CAD (coronary artery disease) stent in 2007  . Tracheal mass   . Hypertension     takes Hyzaar daily  . MI (myocardial infarction) 1996  . Chest congestion     occasionally per pt and wife  . Shortness of breath     with exertion  . Pneumonia     last time 65yrs ago  . Bruises easily   . Dry skin   . Hx of colonic polyps   . Enlarged prostate     takes Proscar  . Abdominal aneurysm     scheduled procedure to take care of this 07/04/12 with Dr.Brabham  . Cancer     basal cell ca on face    Past Surgical History  Procedure Laterality Date  . Skin surgery    . Hernia  repair      right inguinal  . Coronary angioplasty with stent placement  2006  . Colonoscopy    . Esophagogastroduodenoscopy    . Video bronchoscopy  06/25/2012    Procedure: VIDEO BRONCHOSCOPY;  Surgeon: Loreli Slot, MD;  Location: [facility] OR;  Service: Thoracic;  Laterality: N/A;  Using Snare with biopsy  . Tumor removal  2013    esophagus  . Abdominal aortic aneurysm repair  07-04-12    EVAR    Family History  Problem Relation Age of Onset  . Stroke Brother     History  Substance Use Topics  . Smoking status: Current Some Day Smoker -- 0.50 packs/day for 55 years    Types: Cigarettes  . Smokeless tobacco: Never Used  . Alcohol Use: No      Review of Systems  Constitutional: Negative for fever.  HENT: Negative for neck pain.   Eyes: Negative for visual disturbance.  Respiratory: Negative for shortness of breath.   Cardiovascular: Negative for chest pain and leg swelling.  Gastrointestinal: Positive for constipation. Negative for nausea, vomiting and abdominal pain.  Genitourinary: Negative for dysuria and hematuria.  Musculoskeletal: Positive for back pain.  Skin: Negative for rash.  Neurological: Positive for numbness. Negative for weakness  and headaches.  Hematological: Does not bruise/bleed easily.    Allergies  Iodine  Home Medications   Current Outpatient Rx  Name  Route  Sig  Dispense  Refill  . albuterol (PROVENTIL) (2.5 MG/3ML) 0.083% nebulizer solution   Nebulization   Take 2.5 mg by nebulization daily as needed for wheezing or shortness of breath. And/or Congestion         . aspirin 81 MG tablet   Oral   Take 81 mg by mouth daily.           . cholecalciferol (VITAMIN D) 400 UNITS TABS   Oral   Take 400 Units by mouth daily.         . finasteride (PROSCAR) 5 MG tablet   Oral   Take 5 mg by mouth daily.         . fish oil-omega-3 fatty acids 1000 MG capsule   Oral   Take 1-2 g by mouth daily.         Marland Kitchen  losartan-hydrochlorothiazide (HYZAAR) 50-12.5 MG per tablet   Oral   Take 1 tablet by mouth daily.           . niacin 500 MG tablet   Oral   Take 500 mg by mouth at bedtime.         Marland Kitchen oxyCODONE-acetaminophen (PERCOCET/ROXICET) 5-325 MG per tablet   Oral   Take 1 tablet by mouth 4 (four) times daily as needed for pain.         . vitamin C (ASCORBIC ACID) 500 MG tablet   Oral   Take 500 mg by mouth daily.         . cephALEXin (KEFLEX) 500 MG capsule   Oral   Take 1 capsule (500 mg total) by mouth 4 (four) times daily.   28 capsule   0   . docusate sodium (COLACE) 100 MG capsule   Oral   Take 1 capsule (100 mg total) by mouth every 12 (twelve) hours.   14 capsule   1   . HYDROcodone-acetaminophen (NORCO/VICODIN) 5-325 MG per tablet   Oral   Take 1-2 tablets by mouth every 6 (six) hours as needed for pain.   20 tablet   0     BP 190/87  Pulse 81  Temp(Src) 98 F (36.7 C) (Oral)  Resp 18  Ht 5\' 10"  (1.778 m)  Wt 150 lb (68.04 kg)  BMI 21.52 kg/m2  SpO2 97%  Physical Exam  Nursing note and vitals reviewed. Constitutional: He is oriented to person, place, and time. He appears well-developed and well-nourished. No distress.  HENT:  Head: Normocephalic and atraumatic.  Mouth/Throat: Oropharynx is clear and moist.  Eyes: Conjunctivae and EOM are normal. Pupils are equal, round, and reactive to light.  Neck: Normal range of motion.  Cardiovascular: Normal rate, regular rhythm and normal heart sounds.   No murmur heard. Pulmonary/Chest: Effort normal and breath sounds normal.  Abdominal: Soft. Bowel sounds are normal. He exhibits no distension. There is no tenderness.  Musculoskeletal: Normal range of motion. He exhibits no edema.  Right-sided lumbar back pain.  Neurological: He is alert and oriented to person, place, and time. No cranial nerve deficit. He exhibits normal muscle tone. Coordination normal.  Except for some subjective numbness to the anterior  aspect of the right thigh.    ED Course  Procedures (including critical care time)  Labs Reviewed  CBC WITH DIFFERENTIAL - Abnormal; Notable for the following:  WBC 11.6 (*)    Hemoglobin 17.3 (*)    Neutro Abs 8.6 (*)    Monocytes Absolute 1.1 (*)    All other components within normal limits  BASIC METABOLIC PANEL - Abnormal; Notable for the following:    CO2 33 (*)    Glucose, Bld 105 (*)    GFR calc non Af Amer 78 (*)    All other components within normal limits  URINALYSIS, ROUTINE W REFLEX MICROSCOPIC - Abnormal; Notable for the following:    Ketones, ur TRACE (*)    Leukocytes, UA SMALL (*)    All other components within normal limits  URINE MICROSCOPIC-ADD ON - Abnormal; Notable for the following:    Squamous Epithelial / LPF FEW (*)    Bacteria, UA FEW (*)    All other components within normal limits  URINE CULTURE  HEPATIC FUNCTION PANEL  LIPASE, BLOOD   Ct Abdomen Pelvis W Contrast  12/18/2012  *RADIOLOGY REPORT*  Clinical Data: Back pain  CT ABDOMEN AND PELVIS WITH CONTRAST  Technique:  Multidetector CT imaging of the abdomen and pelvis was performed following the standard protocol during bolus administration of intravenous contrast.  Contrast: 50mL OMNIPAQUE IOHEXOL 300 MG/ML  SOLN, OMNIPAQUE IOHEXOL 300 MG/ML  SOLN  Comparison: 08/04/2012  Findings: Moderate to advanced changes of centrilobular emphysema noted within the lung bases.  Scar like opacities identified bilaterally.  Mild diffuse low attenuation within the liver parenchyma is noted consistent with fatty infiltration.  Low attenuation structures within the left hepatic lobe are noted.  Likely cysts.  Inferior right hepatic lobe cyst appears similar to previous exam.  The gallbladder appears within normal limits.  Normal appearance of the pancreas.  There is no biliary dilatation.  The spleen is normal.  Stable small nodule in the right adrenal gland measuring 9 mm. Left adrenal adenoma is noted measuring  2 cm.  Smaller left adrenal adenoma measures 1.5 cm and is also unchanged from previous examination.  Simple appearing cyst is noted within the upper pole of the right kidney measuring 1.6 cm.  There is a small stone noted within the upper pole of the right kidney measuring 2 mm, image 29. No right-sided hydronephrosis or hydroureter identified.  Inferior pole calculus within the left kidney measures 2 mm, image 31. There is bilateral extrarenal pelvis.  No obstructive uropathy. Stable 9 mm cyst arising from the inferior pole the left kidney. Marked enlargement of the prostate gland.  Similar appearance of the ventral bladder wall trabeculation and thickening.  Status post stent graft repair of the abdominal aorta.  No evidence for endoleak.  The aneurysm sac measures 4.9 cm, image 38.  This is decreased from 5.2 cm previously.  No upper abdominal adenopathy.  There is no pelvic or inguinal adenopathy noted.  The stomach and the small bowel loops have a normal caliber without evidence for obstruction.  There is a moderate to marked stool burden identified within the proximal colon.  The distal colon and rectum appear within normal limits.  No evidence for bowel perforation or bowel obstruction.  There is no free fluid or abnormal fluid collections identified.  The bones appear osteopenic and there is multilevel degenerative disc disease identified.  This is most severe at the L5 -S1 level where there is a first-degree anterolisthesis. Bilateral pars defects at the L5 level are noted.  Compression deformity involves the L2 vertebra.  This is a new finding when compared with 08/04/2012.  IMPRESSION:  1.  New L2 compression deformity with loss of approximately 20% of the superior endplate 2.  Lumbar degenerative disc disease. There are bilateral pars defects at the L5 level.  3.  Moderate stool burden within the right colon.  No evidence for bowel obstruction or bowel perforation. 4.  Status post stent graft repair of  the abdominal aorta.  There is been slight decrease in size of the aneurysm sac. 5.  Liver cyst and hepatic steatosis. 6.  Bilateral adrenal nodules.  Likely benign adenomas. 7.  Bilateral nonobstructing renal calculi. 8.  Marked prostate gland enlargement.   Original Report Authenticated By: Signa Kell, M.D.    Dg Abd Acute W/chest  12/18/2012  *RADIOLOGY REPORT*  Clinical Data: Back pain, constipation.  ACUTE ABDOMEN SERIES (ABDOMEN 2 VIEW & CHEST 1 VIEW)  Comparison: None.  Findings: COPD changes.  Heart is normal size.  Lungs are clear. No effusions.  No acute bony abnormality.  Prior stent graft repair of abdominal aortic aneurysm.  Mild gaseous distention of the colon.  No small bowel distention to suggest small bowel obstruction.  No free air organomegaly. Moderate stool throughout the colon.  No acute bony abnormality.  IMPRESSION: Moderate stool burden throughout the colon with mild gaseous distention of the colon.  No evidence of bowel obstruction or free air.  COPD.   Original Report Authenticated By: Charlett Nose, M.D.    Results for orders placed during the hospital encounter of 12/18/12  CBC WITH DIFFERENTIAL      Result Value Range   WBC 11.6 (*) 4.0 - 10.5 K/uL   RBC 5.50  4.22 - 5.81 MIL/uL   Hemoglobin 17.3 (*) 13.0 - 17.0 g/dL   HCT 16.1  09.6 - 04.5 %   MCV 92.7  78.0 - 100.0 fL   MCH 31.5  26.0 - 34.0 pg   MCHC 33.9  30.0 - 36.0 g/dL   RDW 40.9  81.1 - 91.4 %   Platelets 224  150 - 400 K/uL   Neutrophils Relative 74  43 - 77 %   Neutro Abs 8.6 (*) 1.7 - 7.7 K/uL   Lymphocytes Relative 13  12 - 46 %   Lymphs Abs 1.5  0.7 - 4.0 K/uL   Monocytes Relative 10  3 - 12 %   Monocytes Absolute 1.1 (*) 0.1 - 1.0 K/uL   Eosinophils Relative 3  0 - 5 %   Eosinophils Absolute 0.3  0.0 - 0.7 K/uL   Basophils Relative 1  0 - 1 %   Basophils Absolute 0.1  0.0 - 0.1 K/uL  BASIC METABOLIC PANEL      Result Value Range   Sodium 137  135 - 145 mEq/L   Potassium 4.0  3.5 - 5.1 mEq/L    Chloride 96  96 - 112 mEq/L   CO2 33 (*) 19 - 32 mEq/L   Glucose, Bld 105 (*) 70 - 99 mg/dL   BUN 23  6 - 23 mg/dL   Creatinine, Ser 7.82  0.50 - 1.35 mg/dL   Calcium 95.6  8.4 - 21.3 mg/dL   GFR calc non Af Amer 78 (*) >90 mL/min   GFR calc Af Amer >90  >90 mL/min  HEPATIC FUNCTION PANEL      Result Value Range   Total Protein 7.5  6.0 - 8.3 g/dL   Albumin 3.9  3.5 - 5.2 g/dL   AST 13  0 - 37 U/L   ALT 8  0 - 53 U/L  Alkaline Phosphatase 71  39 - 117 U/L   Total Bilirubin 0.6  0.3 - 1.2 mg/dL   Bilirubin, Direct 0.1  0.0 - 0.3 mg/dL   Indirect Bilirubin 0.5  0.3 - 0.9 mg/dL  LIPASE, BLOOD      Result Value Range   Lipase 23  11 - 59 U/L  URINALYSIS, ROUTINE W REFLEX MICROSCOPIC      Result Value Range   Color, Urine YELLOW  YELLOW   APPearance CLEAR  CLEAR   Specific Gravity, Urine 1.025  1.005 - 1.030   pH 6.0  5.0 - 8.0   Glucose, UA NEGATIVE  NEGATIVE mg/dL   Hgb urine dipstick NEGATIVE  NEGATIVE   Bilirubin Urine NEGATIVE  NEGATIVE   Ketones, ur TRACE (*) NEGATIVE mg/dL   Protein, ur NEGATIVE  NEGATIVE mg/dL   Urobilinogen, UA 0.2  0.0 - 1.0 mg/dL   Nitrite NEGATIVE  NEGATIVE   Leukocytes, UA SMALL (*) NEGATIVE  URINE MICROSCOPIC-ADD ON      Result Value Range   Squamous Epithelial / LPF FEW (*) RARE   WBC, UA 21-50  <3 WBC/hpf   Bacteria, UA FEW (*) RARE   Urine-Other MANY YEAST        1. Urinary tract infection   2. Lumbar compression fracture, closed, initial encounter   3. Constipation       MDM   Workup in the emergency department with 3 separate findings. The first finding is urinalysis consistent with urinary tract infection or prostate infection. Treated with 1 g Rocephin in the emergency department for this and will be discharged home on Keflex and followup with primary care Dr. Peyton Najjar finding was an L2 compression fracture related to his fall. This would explain his back pain. He'll need to followup with his primary care Dr. and orthopedics pain  medicine provided. Third finding is some mild constipation no significant obstruction. Will treat that with Colace and followup with primary care Dr.       Shelda Jakes, MD 12/18/12 1911

## 2012-12-18 NOTE — ED Notes (Signed)
Low back pain since 2/18 when fell. Seen by Dr Phillips Odor and had x-ray done.  Says now he is constipated and took 2 enemas without result and laxatives.  No NVD.

## 2012-12-18 NOTE — ED Notes (Signed)
Discharge instructions reviewed with pt, questions answered. Pt verbalized understanding.  

## 2012-12-20 LAB — URINE CULTURE

## 2012-12-21 ENCOUNTER — Telehealth (HOSPITAL_COMMUNITY): Payer: Self-pay | Admitting: Emergency Medicine

## 2012-12-21 NOTE — ED Notes (Signed)
+   Urine Chart sent to EDP office for review. 

## 2012-12-21 NOTE — ED Notes (Signed)
Patient has +Urine culture. Checking to see if appropriately treated. °

## 2012-12-23 NOTE — ED Notes (Signed)
Chart returned from EDP office . Start Fluconazole 200 mg once daily x 14 days per Roxy Horseman.

## 2012-12-23 NOTE — ED Notes (Signed)
Patient informed of positive results after id'd x 2 and requests that rx be called to [facility]  [phone].

## 2012-12-30 ENCOUNTER — Encounter: Payer: Self-pay | Admitting: Gastroenterology

## 2012-12-30 ENCOUNTER — Ambulatory Visit (INDEPENDENT_AMBULATORY_CARE_PROVIDER_SITE_OTHER): Payer: Medicare HMO | Admitting: Gastroenterology

## 2012-12-30 VITALS — BP 142/75 | HR 89 | Temp 97.4°F | Ht 73.0 in | Wt 143.2 lb

## 2012-12-30 MED ORDER — LINACLOTIDE 145 MCG PO CAPS: 145.0000 ug | ORAL_CAPSULE | Freq: Every day | ORAL | Status: DC

## 2012-12-30 NOTE — Progress Notes (Signed)
Referring Provider: Corrie Mckusick, MD Primary Care Physician:  Colette Ribas, MD Primary Gastroenterologist: Dr. Jena Gauss   Chief Complaint  Patient presents with  . Colonoscopy  . Constipation    HPI:   Pleasant 79 year old male presents today with history of constipation, also notable history of multiple adenomas on last colonoscopy October 2011. Due for surveillance Oct 2014. Notes recurrent constipation. Usually goes a few days without having a BM. Notes improvement in the last few days. Taking Metamucil, prune juice. Was recently on narcotics after falling. No abdominal pain. Appetite not like it used to be. 2006 weighed 218 pounds. Currently weighs 143 now. Denies N/V, dysphagia. Eats about twice a day, snacks are fiber. He has had endovascular aneurysm repair in Sept 2013.   Past Medical History  Diagnosis Date  . Urinary frequency   . Bronchitis   . COPD (chronic obstructive pulmonary disease)   . Hypercholesterolemia     takes Niacin nightly  . CAD (coronary artery disease) stent in 2007  . Tracheal mass     s/p endoscopic resection  . Hypertension     takes Hyzaar daily  . MI (myocardial infarction) 1996  . Chest congestion     occasionally per pt and wife  . Shortness of breath     with exertion  . Pneumonia     last time 71yrs ago  . Bruises easily   . Dry skin   . Hx of colonic polyps   . Enlarged prostate     takes Proscar  . Abdominal aneurysm     scheduled procedure to take care of this 07/04/12 with Dr.Brabham  . Cancer     basal cell ca on face    Past Surgical History  Procedure Laterality Date  . Skin surgery    . Hernia repair      right inguinal  . Coronary angioplasty with stent placement  2006  . Colonoscopy    . Esophagogastroduodenoscopy  Oct 2011    RMR: normal esophagus, J-shaped stomach, mottled gastric mucosa, bulbar erosions: path minimal chronic gastritis  . Video bronchoscopy  06/25/2012    Procedure: VIDEO BRONCHOSCOPY;   Surgeon: Loreli Slot, MD;  Location[facility] OR;  Service: Thoracic;  Laterality: N/A;  Using Snare with biopsy  . Tumor removal  2013    esophagus  . Abdominal aortic aneurysm repair  07-04-12    EVAR  . Colonoscopy  Oct 2011    RMR: multiple colon polyps, left-sided diverticula, adenomatous, needs surveillance Oct 2014    Current Outpatient Prescriptions  Medication Sig Dispense Refill  . albuterol (PROVENTIL) (2.5 MG/3ML) 0.083% nebulizer solution Take 2.5 mg by nebulization daily as needed for wheezing or shortness of breath. And/or Congestion      . aspirin 81 MG tablet Take 81 mg by mouth daily.        . cephALEXin (KEFLEX) 500 MG capsule Take 1 capsule (500 mg total) by mouth 4 (four) times daily.  28 capsule  0  . cholecalciferol (VITAMIN D) 400 UNITS TABS Take 400 Units by mouth daily.      Marland Kitchen docusate sodium (COLACE) 100 MG capsule Take 1 capsule (100 mg total) by mouth every 12 (twelve) hours.  14 capsule  1  . finasteride (PROSCAR) 5 MG tablet Take 5 mg by mouth daily.      . fish oil-omega-3 fatty acids 1000 MG capsule Take 1-2 g by mouth daily.      Marland Kitchen losartan-hydrochlorothiazide (HYZAAR) 50-12.5 MG  per tablet Take 1 tablet by mouth daily.        . niacin 500 MG tablet Take 500 mg by mouth at bedtime.      Marland Kitchen oxyCODONE-acetaminophen (PERCOCET/ROXICET) 5-325 MG per tablet Take 1 tablet by mouth 4 (four) times daily as needed for pain.      . vitamin C (ASCORBIC ACID) 500 MG tablet Take 500 mg by mouth daily.      Marland Kitchen HYDROcodone-acetaminophen (NORCO/VICODIN) 5-325 MG per tablet Take 1-2 tablets by mouth every 6 (six) hours as needed for pain.  20 tablet  0  . Linaclotide (LINZESS) 145 MCG CAPS Take 1 capsule (145 mcg total) by mouth daily. For constipation. 30 minutes before breakfast.  30 capsule  3   No current facility-administered medications for this visit.    Allergies as of 12/30/2012 - Review Complete 12/30/2012  Allergen Reaction Noted  . Iodine      Family  History  Problem Relation Age of Onset  . Stroke Brother     History   Social History  . Marital Status: Married    Spouse Name: N/A    Number of Children: N/A  . Years of Education: N/A   Social History Main Topics  . Smoking status: Current Some Day Smoker -- 0.50 packs/day for 55 years    Types: Cigarettes  . Smokeless tobacco: Never Used  . Alcohol Use: No  . Drug Use: No  . Sexually Active: Yes    Birth Control/ Protection: None   Other Topics Concern  . None   Social History Narrative  . None    Review of Systems: Negative unless reviewed in HPI  Physical Exam: BP 142/75  Pulse 89  Temp(Src) 97.4 F (36.3 C) (Oral)  Ht 6\' 1"  (1.854 m)  Wt 143 lb 3.2 oz (64.955 kg)  BMI 18.9 kg/m2 General:   Alert and oriented. No distress noted. Pleasant and cooperative.  Head:  Normocephalic and atraumatic. Eyes:  Conjuctiva clear without scleral icterus. Mouth:  Oral mucosa pink and moist. Good dentition. No lesions. Neck:  Supple, without mass or thyromegaly. Heart:  S1, S2 present without murmurs, rubs, or gallops. Regular rate and rhythm. Abdomen:  +BS, soft, non-tender and non-distended. No rebound or guarding. No HSM or masses noted. Msk:  Symmetrical without gross deformities. Normal posture. Extremities:  Without edema. Neurologic:  Alert and  oriented x4;  grossly normal neurologically. Skin:  Intact without significant lesions or rashes. Cervical Nodes:  No significant cervical adenopathy. Psych:  Alert and cooperative. Normal mood and affect.

## 2012-12-30 NOTE — Patient Instructions (Addendum)
Continue to eat foods rich in fiber.   Trial of Linzess 1 capsule each morning before breakfast. This is for constipation. If you have diarrhea, cut back to every other day.  We will see you in September to set up the colonoscopy.

## 2012-12-31 ENCOUNTER — Other Ambulatory Visit (HOSPITAL_COMMUNITY): Payer: Self-pay | Admitting: Family Medicine

## 2012-12-31 ENCOUNTER — Encounter: Payer: Self-pay | Admitting: Gastroenterology

## 2012-12-31 DIAGNOSIS — K5900 Constipation, unspecified: Secondary | ICD-10-CM | POA: Insufficient documentation

## 2012-12-31 NOTE — Assessment & Plan Note (Signed)
EGD and TCS in 2011. He has had multiple health issues over the past few years, and I question weight loss secondary to this. CT on file from 11/2012 negative for occult malignancy. Noted fatty liver. He has no abdominal pain, dysphagia, n/v. Likely multifactorial. Pursue further work-up if weight continues to drop.

## 2012-12-31 NOTE — Assessment & Plan Note (Signed)
79 year old male with history of constipation in the remote past, with last colonoscopy in Oct 2011. He had multiple adenomatous polyps at that time. Due for surveillance Oct 2014. He has recently been taking narcotics after a fall, and this likely has impacted his bowel habits. No rectal bleeding. I would like to try a course of Linzess, continue high fiber diet, and have him return in Sept 2014 to set up surveillance colonoscopy. He is to call if this is not effective.

## 2012-12-31 NOTE — Progress Notes (Signed)
Faxed to PCP

## 2013-01-02 ENCOUNTER — Ambulatory Visit (HOSPITAL_COMMUNITY)
Admission: RE | Admit: 2013-01-02 | Discharge: 2013-01-02 | Disposition: A | Discharge status: Home / Self Care | Payer: Medicare HMO | Source: Ambulatory Visit | Attending: Family Medicine | Admitting: Family Medicine

## 2013-01-02 DIAGNOSIS — M818 Other osteoporosis without current pathological fracture: Secondary | ICD-10-CM | POA: Insufficient documentation

## 2013-01-02 DIAGNOSIS — Z13820 Encounter for screening for osteoporosis: Secondary | ICD-10-CM | POA: Insufficient documentation

## 2013-02-09 ENCOUNTER — Ambulatory Visit: Payer: Medicare HMO | Admitting: Surgery

## 2013-04-03 ENCOUNTER — Encounter: Payer: Self-pay | Admitting: Surgery

## 2013-04-06 ENCOUNTER — Encounter (INDEPENDENT_AMBULATORY_CARE_PROVIDER_SITE_OTHER): Payer: Medicare HMO | Admitting: *Deleted

## 2013-04-06 ENCOUNTER — Encounter: Payer: Self-pay | Admitting: Surgery

## 2013-04-06 ENCOUNTER — Ambulatory Visit (INDEPENDENT_AMBULATORY_CARE_PROVIDER_SITE_OTHER): Payer: Medicare HMO | Admitting: Surgery

## 2013-04-06 VITALS — BP 158/83 | HR 71 | Ht 73.0 in | Wt 144.3 lb

## 2013-04-06 DIAGNOSIS — I714 Abdominal aortic aneurysm, without rupture, unspecified: Secondary | ICD-10-CM

## 2013-04-06 DIAGNOSIS — Z48812 Encounter for surgical aftercare following surgery on the circulatory system: Secondary | ICD-10-CM

## 2013-04-06 NOTE — Progress Notes (Signed)
[facility]   Patient name: Karl Walker MRN: [id number] DOB: 1932/02/01 Sex: male     Chief Complaint  Patient presents with  . Re-evaluation    6 month f/u AAA, s/p EVAR 07/04/2012    HISTORY OF PRESENT ILLNESS: The patient is back today for followup. He is status post endovascular aneurysm repair on 07/04/2012. His initial CT scan showed the graft to be in good position without evidence of endoleak. He has no abdominal pain today. Since his last visit he developed a compression fracture in his back. This is been healing without intervention. The pain has essentially resolved. He has no other complaints today.  Past Medical History  Diagnosis Date  . Urinary frequency   . Bronchitis   . COPD (chronic obstructive pulmonary disease)   . Hypercholesterolemia     takes Niacin nightly  . CAD (coronary artery disease) stent in 2007  . Tracheal mass     s/p endoscopic resection  . Hypertension     takes Hyzaar daily  . MI (myocardial infarction) 1996  . Chest congestion     occasionally per pt and wife  . Shortness of breath     with exertion  . Pneumonia     last time 76yrs ago  . Bruises easily   . Dry skin   . Hx of colonic polyps   . Enlarged prostate     takes Proscar  . Abdominal aneurysm     scheduled procedure to take care of this 07/04/12 with Dr.Lamees Gable  . Cancer     basal cell ca on face    Past Surgical History  Procedure Laterality Date  . Skin surgery    . Hernia repair      right inguinal  . Coronary angioplasty with stent placement  2006  . Colonoscopy    . Esophagogastroduodenoscopy  Oct 2011    RMR: normal esophagus, J-shaped stomach, mottled gastric mucosa, bulbar erosions: path minimal chronic gastritis  . Video bronchoscopy  06/25/2012    Procedure: VIDEO BRONCHOSCOPY;  Surgeon: Loreli Slot, MD;  Location: [facility] OR;  Service: Thoracic;  Laterality: N/A;  Using Snare with biopsy  . Tumor removal  2013     esophagus  . Abdominal aortic aneurysm repair  07-04-12    EVAR  . Colonoscopy  Oct 2011    RMR: multiple colon polyps, left-sided diverticula, adenomatous, needs surveillance Oct 2014    History   Social History  . Marital Status: Married    Spouse Name: N/A    Number of Children: N/A  . Years of Education: N/A   Occupational History  . Not on file.   Social History Main Topics  . Smoking status: Current Some Day Smoker -- 0.50 packs/day for 55 years    Types: Cigarettes  . Smokeless tobacco: Never Used  . Alcohol Use: No  . Drug Use: No  . Sexually Active: Yes    Birth Control/ Protection: None   Other Topics Concern  . Not on file   Social History Narrative  . No narrative on file    Family History  Problem Relation Age of Onset  . Stroke Brother     Allergies as of 04/06/2013 - Review Complete 04/06/2013  Allergen Reaction Noted  . Iodine      Current Outpatient Prescriptions on File Prior to Visit  Medication Sig Dispense Refill  . albuterol (PROVENTIL) (2.5 MG/3ML) 0.083% nebulizer solution Take 2.5 mg by nebulization daily  as needed for wheezing or shortness of breath. And/or Congestion      . aspirin 81 MG tablet Take 81 mg by mouth daily.        . cephALEXin (KEFLEX) 500 MG capsule Take 1 capsule (500 mg total) by mouth 4 (four) times daily.  28 capsule  0  . cholecalciferol (VITAMIN D) 400 UNITS TABS Take 400 Units by mouth daily.      Marland Kitchen docusate sodium (COLACE) 100 MG capsule Take 1 capsule (100 mg total) by mouth every 12 (twelve) hours.  14 capsule  1  . finasteride (PROSCAR) 5 MG tablet Take 5 mg by mouth daily.      . fish oil-omega-3 fatty acids 1000 MG capsule Take 1-2 g by mouth daily.      Marland Kitchen HYDROcodone-acetaminophen (NORCO/VICODIN) 5-325 MG per tablet Take 1-2 tablets by mouth every 6 (six) hours as needed for pain.  20 tablet  0  . Linaclotide (LINZESS) 145 MCG CAPS Take 1 capsule (145 mcg total) by mouth daily. For constipation. 30 minutes  before breakfast.  30 capsule  3  . losartan-hydrochlorothiazide (HYZAAR) 50-12.5 MG per tablet Take 1 tablet by mouth daily.        . niacin 500 MG tablet Take 500 mg by mouth at bedtime.      Marland Kitchen oxyCODONE-acetaminophen (PERCOCET/ROXICET) 5-325 MG per tablet Take 1 tablet by mouth 4 (four) times daily as needed for pain.      . vitamin C (ASCORBIC ACID) 500 MG tablet Take 500 mg by mouth daily.       No current facility-administered medications on file prior to visit.     REVIEW OF SYSTEMS: Please see history of present illness, otherwise all systems negative  PHYSICAL EXAMINATION:   Vital signs are BP 158/83  Pulse 71  Ht 6\' 1"  (1.854 m)  Wt 144 lb 4.8 oz (65.454 kg)  BMI 19.04 kg/m2  SpO2 99% General: The patient appears their stated age. HEENT:  No gross abnormalities Pulmonary:  Non labored breathing Abdomen: Soft and non-tender. Aorta is not palpable Musculoskeletal: There are no major deformities. Neurologic: No focal weakness or paresthesias are detected, Skin: There are no ulcer or rashes noted. Psychiatric: The patient has normal affect. Cardiovascular: There is a regular rate and rhythm without significant murmur appreciated. Palpable pedal pulses   Diagnostic Studies I have reviewed his ultrasound today. This shows the sac now measures 4.8 cm. There is no evidence of endoleak.  I have also re- reviewed his carotid duplex from pre-CABG. This shows no significant carotid disease.   Assessment: Status post endovascular aneurysm repair Plan: The patient will continue routine surveillance. His next that he would be a duplex ultrasound in 6 months. If this study continues to 2 showed a decreased in the size of his aneurysm sac, he will be able to transition to yearly surveillance with ultrasound.  Jorge Ny, M.D. [facility] [place] Office: [phone] Pager:  [phone]

## 2013-04-06 NOTE — Addendum Note (Signed)
Addended by: Adria Dill L on: 04/06/2013 02:12 PM   Modules accepted: Orders

## 2013-05-22 ENCOUNTER — Encounter: Payer: Self-pay | Admitting: *Deleted

## 2013-05-28 ENCOUNTER — Encounter: Payer: Self-pay | Admitting: Cardiovascular Disease

## 2013-05-28 ENCOUNTER — Ambulatory Visit (INDEPENDENT_AMBULATORY_CARE_PROVIDER_SITE_OTHER): Payer: Medicare HMO | Admitting: Cardiovascular Disease

## 2013-05-28 VITALS — BP 128/70 | HR 75 | Resp 16 | Ht 70.0 in | Wt 147.6 lb

## 2013-05-28 DIAGNOSIS — F17200 Nicotine dependence, unspecified, uncomplicated: Secondary | ICD-10-CM

## 2013-05-28 DIAGNOSIS — E782 Mixed hyperlipidemia: Secondary | ICD-10-CM

## 2013-05-28 DIAGNOSIS — I2510 Atherosclerotic heart disease of native coronary artery without angina pectoris: Secondary | ICD-10-CM

## 2013-05-28 DIAGNOSIS — I714 Abdominal aortic aneurysm, without rupture, unspecified: Secondary | ICD-10-CM

## 2013-05-28 DIAGNOSIS — Z79899 Other long term (current) drug therapy: Secondary | ICD-10-CM

## 2013-05-28 DIAGNOSIS — E785 Hyperlipidemia, unspecified: Secondary | ICD-10-CM

## 2013-05-28 DIAGNOSIS — Z720 Tobacco use: Secondary | ICD-10-CM

## 2013-05-28 DIAGNOSIS — I10 Essential (primary) hypertension: Secondary | ICD-10-CM

## 2013-05-28 NOTE — Patient Instructions (Addendum)
Your physician recommends that you return for lab work at [facility] in [place] - FASTING (nothing to eat or drink after midnight the night before).  Your physician recommends that you schedule a follow-up appointment in: One year

## 2013-05-29 LAB — LIPID PANEL
LDL Cholesterol: 94 mg/dL (ref 0–99)
Triglycerides: 106 mg/dL (ref <150)
VLDL: 21 mg/dL (ref 0–40)

## 2013-05-29 LAB — COMPREHENSIVE METABOLIC PANEL
BUN: 15 mg/dL (ref 6–23)
CO2: 30 mEq/L (ref 19–32)
Calcium: 9.6 mg/dL (ref 8.4–10.5)
Chloride: 108 mEq/L (ref 96–112)
Creat: 1.02 mg/dL (ref 0.50–1.35)

## 2013-05-31 ENCOUNTER — Encounter: Payer: Self-pay | Admitting: Cardiovascular Disease

## 2013-05-31 DIAGNOSIS — I10 Essential (primary) hypertension: Secondary | ICD-10-CM | POA: Insufficient documentation

## 2013-05-31 DIAGNOSIS — Z720 Tobacco use: Secondary | ICD-10-CM | POA: Insufficient documentation

## 2013-05-31 DIAGNOSIS — E785 Hyperlipidemia, unspecified: Secondary | ICD-10-CM | POA: Insufficient documentation

## 2013-05-31 NOTE — Assessment & Plan Note (Addendum)
I am concerned that he is no longer taking a statin. He has significant peripheral arterial disease and coronary disease. We'll repeat her lipid profile. We will probably need to choose an agent other than simvastatin. He may do well with a hydrophilic statin such as pravastatin or Crestor. We discussed the fact that the Niaspan has not been shown to have significant clinical benefits and recent clinical trials have been particularly discouraging to

## 2013-05-31 NOTE — Assessment & Plan Note (Signed)
It has been 6 years since the placement of a stent to the right coronary artery and he has not had any new coronary event since that time. We discussed the fact that the most important interventions to keep coronary disease at bay be lowering his cholesterol with statins as well as complete avoidance of smoking. At the time of his cardiac catheterization in 2008 he was noted to have minor atherosclerosis in the diagonal branch the only significant lesion was a 75% stenosis in the dominant right coronary artery that was treated with a 3.5 x 18 mm drug-eluting Cypher stent.

## 2013-05-31 NOTE — Assessment & Plan Note (Signed)
Status post stent graft repair in 2013, Dr. Durene Cal

## 2013-05-31 NOTE — Assessment & Plan Note (Signed)
Good control. Beta blockers would be a preferred agent in view of his history of abdominal aortic aneurysm, but he has reactive airway disease. Intense receptor blockers a good second choice.

## 2013-05-31 NOTE — Assessment & Plan Note (Signed)
Discussed in detail the need to avoid smoking long-term

## 2013-05-31 NOTE — Progress Notes (Signed)
Patient ID: JOFFRE LUCKS, male   DOB: Mar 12, 1932, 79 y.o.   MRN: [id number]     Reason for office visit CAD followup  Mr. Cambre had repeat evaluation of his coronary situation last year, just before undergoing aortic aneurysm stent graft repair by Dr. Myra Gianotti. He has a history of an inferior wall non-ST segment elevation myocardial infarction treated with a drug-eluting stent in 2008. He has not had any new events since that time. His nuclear stress test last year showed no evidence of reversible defects. Ejection fraction of 46%. He has no clinical heart failure.  Unfortunately he continues to smoke. He statin was discontinued by his primary care physician due to complaints of weakness. He is only on a low dose of over-the-counter niacin as a lipid-lowering agent.   Allergies  Allergen Reactions  . Iodine     Per pt, issue with shrimp x 20 years ago, ok with CT contrast since    Current Outpatient Prescriptions  Medication Sig Dispense Refill  . albuterol (PROVENTIL) (2.5 MG/3ML) 0.083% nebulizer solution Take 2.5 mg by nebulization daily as needed for wheezing or shortness of breath. And/or Congestion      . aspirin 81 MG tablet Take 81 mg by mouth daily.        . cholecalciferol (VITAMIN D) 400 UNITS TABS Take 400 Units by mouth daily.      . finasteride (PROSCAR) 5 MG tablet Take 5 mg by mouth daily.      Marland Kitchen losartan-hydrochlorothiazide (HYZAAR) 50-12.5 MG per tablet Take 1 tablet by mouth daily.        . niacin 500 MG tablet Take 500 mg by mouth at bedtime.      . vitamin C (ASCORBIC ACID) 500 MG tablet Take 500 mg by mouth daily.       No current facility-administered medications for this visit.    Past Medical History  Diagnosis Date  . Urinary frequency   . Bronchitis   . COPD (chronic obstructive pulmonary disease)   . Hypercholesterolemia     takes Niacin nightly  . CAD (coronary artery disease) stent in 2007  . Tracheal mass     s/p endoscopic resection  .  Hypertension     takes Hyzaar daily  . MI (myocardial infarction) 1996  . Chest congestion     occasionally per pt and wife  . Shortness of breath     with exertion  . Pneumonia     last time 13yrs ago  . Bruises easily   . Dry skin   . Hx of colonic polyps   . Enlarged prostate     takes Proscar  . Abdominal aneurysm     scheduled procedure to take care of this 07/04/12 with Dr.Brabham  . Cancer     basal cell ca on face    Past Surgical History  Procedure Laterality Date  . Skin surgery    . Hernia repair      right inguinal  . Coronary angioplasty with stent placement  2006  . Colonoscopy    . Esophagogastroduodenoscopy  Oct 2011    RMR: normal esophagus, J-shaped stomach, mottled gastric mucosa, bulbar erosions: path minimal chronic gastritis  . Video bronchoscopy  06/25/2012    Procedure: VIDEO BRONCHOSCOPY;  Surgeon: Loreli Slot, MD;  Location: [facility] OR;  Service: Thoracic;  Laterality: N/A;  Using Snare with biopsy  . Tumor removal  2013    esophagus  . Abdominal aortic aneurysm repair  07-04-12  EVAR  . Colonoscopy  Oct 2011    RMR: multiple colon polyps, left-sided diverticula, adenomatous, needs surveillance Oct 2014  . US echocardiography  05/24/2009    mild AI,MR  . Nm myocar perf wall motion  06/18/2012    No ischemia  . Coronary angioplasty with stent placement  12/17/2006    Stent to the RCA    Family History  Problem Relation Age of Onset  . Stroke Brother     History   Social History  . Marital Status: Married    Spouse Name: N/A    Number of Children: N/A  . Years of Education: N/A   Occupational History  . Not on file.   Social History Main Topics  . Smoking status: Current Some Day Smoker -- 0.50 packs/day for 55 years    Types: Cigarettes  . Smokeless tobacco: Never Used  . Alcohol Use: No  . Drug Use: No  . Sexually Active: Yes    Birth Control/ Protection: None   Other Topics Concern  . Not on file   Social History  Narrative  . No narrative on file    Review of systems: The patient specifically denies any chest pain at rest or with exertion, dyspnea at rest or with exertion, orthopnea, paroxysmal nocturnal dyspnea, syncope, palpitations, focal neurological deficits, intermittent claudication, lower extremity edema, unexplained weight gain, cough, hemoptysis or wheezing.  The patient also denies abdominal pain, nausea, vomiting, dysphagia, diarrhea, constipation, polyuria, polydipsia, dysuria, hematuria, frequency, urgency, abnormal bleeding or bruising, fever, chills, unexpected weight changes, mood swings, change in skin or hair texture, change in voice quality, auditory or visual problems, allergic reactions or rashes, new musculoskeletal complaints other than usual "aches and pains".   PHYSICAL EXAM BP 128/70  Pulse 75  Resp 16  Ht 5\' 10"  (1.778 m)  Wt 147 lb 9.6 oz (66.951 kg)  BMI 21.18 kg/m2  General: Alert, oriented x3, no distress Head: no evidence of trauma, PERRL, EOMI, no exophtalmos or lid lag, no myxedema, no xanthelasma; normal ears, nose and oropharynx Neck: normal jugular venous pulsations and no hepatojugular reflux; brisk carotid pulses without delay and no carotid bruits Chest: Generally diminished breath sounds, but clear to auscultation, no signs of consolidation by percussion or palpation, normal fremitus, symmetrical and full respiratory excursions Cardiovascular: normal position and quality of the apical impulse, regular rhythm, distant first and widely split second heart sounds, no murmurs, rubs or gallops Abdomen: no tenderness or distention, no masses by palpation, no abnormal pulsatility or arterial bruits, normal bowel sounds, no hepatosplenomegaly Extremities: no clubbing, cyanosis or edema; 2+ radial, ulnar and brachial pulses bilaterally; 2+ right femoral, posterior tibial and dorsalis pedis pulses; 2+ left femoral, posterior tibial and dorsalis pedis pulses; no  subclavian or femoral bruits Neurological: grossly nonfocal   EKG: NSR, RBBB  Lipid Panel     Component Value Date/Time   CHOL 168 05/29/2013 0730   TRIG 106 05/29/2013 0730   HDL 53 05/29/2013 0730   CHOLHDL 3.2 05/29/2013 0730   VLDL 21 05/29/2013 0730   LDLCALC 94 05/29/2013 0730    BMET    Component Value Date/Time   NA 142 05/29/2013 0730   K 4.3 05/29/2013 0730   CL 108 05/29/2013 0730   CO2 30 05/29/2013 0730   GLUCOSE 95 05/29/2013 0730   BUN 15 05/29/2013 0730   CREATININE 1.02 05/29/2013 0730   CREATININE 0.94 12/18/2012 1643   CALCIUM 9.6 05/29/2013 0730   GFRNONAA 78* 12/18/2012 1643  GFRAA >90 12/18/2012 1643     ASSESSMENT AND PLAN Coronary artery disease It has been 6 years since the placement of a stent to the right coronary artery and he has not had any new coronary event since that time. We discussed the fact that the most important interventions to keep coronary disease at bay be lowering his cholesterol with statins as well as complete avoidance of smoking. At the time of his cardiac catheterization in 2008 he was noted to have minor atherosclerosis in the diagonal branch the only significant lesion was a 75% stenosis in the dominant right coronary artery that was treated with a 3.5 x 18 mm drug-eluting Cypher stent.  Hyperlipidemia I am concerned that he is no longer taking a statin. He has significant peripheral arterial disease and coronary disease. We'll repeat her lipid profile. We will probably need to choose an agent other than simvastatin. He may do well with a hydrophilic statin such as pravastatin or Crestor. We discussed the fact that the Niaspan has not been shown to have significant clinical benefits and recent clinical trials have been particularly discouraging to  Abdominal aneurysm without mention of rupture Status post stent graft repair in 2013, Dr. Durene Cal  HTN (hypertension) Good control. Beta blockers would be a preferred agent in view of his history of  abdominal aortic aneurysm, but he has reactive airway disease. Intense receptor blockers a good second choice.  Tobacco abuse Discussed in detail the need to avoid smoking long-term  Orders Placed This Encounter  Procedures  . Comp Met (CMET)  . Lipid Profile  . EKG 12-Lead   No orders of the defined types were placed in this encounter.    Junious Silk, MD, [facility] [phone] office [phone] pager

## 2013-06-01 ENCOUNTER — Encounter: Payer: Self-pay | Admitting: Internal Medicine

## 2013-06-23 ENCOUNTER — Other Ambulatory Visit: Payer: Self-pay

## 2013-06-23 DIAGNOSIS — R222 Localized swelling, mass and lump, trunk: Secondary | ICD-10-CM

## 2013-06-30 ENCOUNTER — Encounter: Payer: Self-pay | Admitting: Internal Medicine

## 2013-07-21 ENCOUNTER — Ambulatory Visit: Payer: Medicare HMO | Admitting: Thoracic Surgery (Cardiothoracic Vascular Surgery)

## 2013-07-21 ENCOUNTER — Ambulatory Visit
Admission: RE | Admit: 2013-07-21 | Discharge: 2013-07-21 | Disposition: A | Discharge status: Home / Self Care | Payer: Medicare HMO | Source: Ambulatory Visit | Attending: Thoracic Surgery (Cardiothoracic Vascular Surgery) | Admitting: Thoracic Surgery (Cardiothoracic Vascular Surgery)

## 2013-07-21 DIAGNOSIS — R222 Localized swelling, mass and lump, trunk: Secondary | ICD-10-CM

## 2013-07-23 ENCOUNTER — Ambulatory Visit (INDEPENDENT_AMBULATORY_CARE_PROVIDER_SITE_OTHER): Payer: Medicare HMO | Admitting: Thoracic Surgery (Cardiothoracic Vascular Surgery)

## 2013-07-23 ENCOUNTER — Encounter: Payer: Self-pay | Admitting: Thoracic Surgery (Cardiothoracic Vascular Surgery)

## 2013-07-23 VITALS — BP 146/85 | HR 84 | Resp 20 | Ht 70.0 in | Wt 150.0 lb

## 2013-07-23 DIAGNOSIS — J398 Other specified diseases of upper respiratory tract: Secondary | ICD-10-CM

## 2013-07-23 DIAGNOSIS — D369 Benign neoplasm, unspecified site: Secondary | ICD-10-CM

## 2013-07-23 DIAGNOSIS — D179 Benign lipomatous neoplasm, unspecified: Secondary | ICD-10-CM

## 2013-07-23 DIAGNOSIS — R222 Localized swelling, mass and lump, trunk: Secondary | ICD-10-CM

## 2013-07-23 NOTE — Progress Notes (Signed)
HPI:  Karl Walker returns today for a scheduled one year followup visit.  He is a 79 year old gentleman who was undergoing workup for a stent graft about a year ago when a CT scan showed a large endotracheal mass. He was constantly stridorous and wheezing at that time. His wife noticed this more than he did. We did an endobronchial resection of the mass in September of last year. After that he went on to have his stent graft placed.  He says that he has been having problems with wheezing, usually after mowing his lawn even though he wears a mass. His wife has not noted the stridorous breathing that he had prior to the endobronchial resection. He does get short of breath with exertion. 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 He has not had any unusual cough or hemoptysis.  Past Medical History  Diagnosis Date  . Urinary frequency   . Bronchitis   . COPD (chronic obstructive pulmonary disease)   . Hypercholesterolemia     takes Niacin nightly  . CAD (coronary artery disease) stent in 2007  . Tracheal mass     s/p endoscopic resection  . Hypertension     takes Hyzaar daily  . MI (myocardial infarction) 1996  . Chest congestion     occasionally per pt and wife  . Shortness of breath     with exertion  . Pneumonia     last time 59yrs ago  . Bruises easily   . Dry skin   . Hx of colonic polyps   . Enlarged prostate     takes Proscar  . Abdominal aneurysm     scheduled procedure to take care of this 07/04/12 with Dr.Brabham  . Cancer     basal cell ca on face      Current Outpatient Prescriptions  Medication Sig Dispense Refill  . albuterol (PROVENTIL) (2.5 MG/3ML) 0.083% nebulizer solution Take 2.5 mg by nebulization daily as needed for wheezing or shortness of breath. And/or Congestion      . aspirin 81 MG tablet Take 81 mg by mouth daily.        . cholecalciferol (VITAMIN D) 400 UNITS TABS Take 400 Units by mouth daily.      . finasteride (PROSCAR) 5 MG tablet  Take 5 mg by mouth daily.      Marland Kitchen losartan-hydrochlorothiazide (HYZAAR) 50-12.5 MG per tablet Take 1 tablet by mouth daily.        . niacin 500 MG tablet Take 500 mg by mouth at bedtime.      . vitamin C (ASCORBIC ACID) 500 MG tablet Take 500 mg by mouth daily.       No current facility-administered medications for this visit.    Physical Exam BP 146/85  Pulse 84  Resp 20  Ht 5\' 10"  (1.778 m)  Wt 150 lb (68.04 kg)  BMI 21.52 kg/m2  SpO2 5% Thin 79 year old white male in no acute distress Alert and oriented x3 with no neurologic deficits Cardiac  regular rate and rhythm normal S1 and S2 Lungs diminished breath sounds bilaterally. no stridor or wheezing  Diagnostic Tests: CT chest 07/21/2013 EXAM:  CT CHEST WITHOUT CONTRAST  TECHNIQUE:  Multidetector CT imaging of the chest was performed following the  standard protocol without IV contrast.  COMPARISON: 06/09/2012  FINDINGS:  The lungs demonstrate diffuse emphysematous changes. Bibasilar  scarring is again identified. The previously seen left lower lobe  nodule is less well visualized on the current exam.  No new pulmonary  nodules are identified.  The previously described thyroid nodules are not well visualized on  this exam due to the lack of IV contrast. The overall size of the  thyroid gland however is stable.  Diffuse calcifications of the thoracic aorta are noted. No  aneurysmal dilatation or focal dissection is seen. Coronary  calcifications are again noted. No significant hilar or mediastinal  adenopathy is noted.  Scanning into the upper abdomen reveals nonobstructing renal stones  bilaterally. Stable hepatic cyst is noted in the left lobe.  Additionally changes of an aortic stent graft are noted.  IMPRESSION:  Nonobstructing renal stones bilaterally.  Stable appearing left lower lobe nodule. It is slightly less well  visualized on the current exam.  No acute abnormality is noted.  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 He has no evidence of recurrence.  He also had a small right lower lobe nodule noted on the CT a year ago. This is an area of scarring. Per the radiologist this is not as prominent today. We will continue to follow this area on CT as well.  Plan: Return in one year with CT of chest

## 2013-08-21 ENCOUNTER — Ambulatory Visit (INDEPENDENT_AMBULATORY_CARE_PROVIDER_SITE_OTHER): Payer: Medicare HMO | Admitting: Urology

## 2013-08-21 ENCOUNTER — Encounter (INDEPENDENT_AMBULATORY_CARE_PROVIDER_SITE_OTHER): Payer: Self-pay

## 2013-08-21 DIAGNOSIS — N138 Other obstructive and reflux uropathy: Secondary | ICD-10-CM

## 2013-08-21 DIAGNOSIS — N3020 Other chronic cystitis without hematuria: Secondary | ICD-10-CM

## 2013-08-28 ENCOUNTER — Encounter: Payer: Self-pay | Admitting: Cardiovascular Disease

## 2013-10-12 ENCOUNTER — Ambulatory Visit: Payer: Medicare HMO | Admitting: Family

## 2013-10-12 ENCOUNTER — Other Ambulatory Visit (HOSPITAL_COMMUNITY): Payer: Medicare HMO

## 2013-10-12 ENCOUNTER — Encounter (HOSPITAL_COMMUNITY): Payer: Medicare HMO

## 2013-10-23 ENCOUNTER — Ambulatory Visit (INDEPENDENT_AMBULATORY_CARE_PROVIDER_SITE_OTHER): Payer: Commercial Managed Care - HMO | Admitting: Urology

## 2013-10-23 ENCOUNTER — Encounter: Payer: Self-pay | Admitting: Family

## 2013-10-23 ENCOUNTER — Encounter (INDEPENDENT_AMBULATORY_CARE_PROVIDER_SITE_OTHER): Payer: Self-pay

## 2013-10-23 DIAGNOSIS — N3020 Other chronic cystitis without hematuria: Secondary | ICD-10-CM

## 2013-10-26 ENCOUNTER — Ambulatory Visit (HOSPITAL_COMMUNITY)
Admission: RE | Admit: 2013-10-26 | Discharge: 2013-10-26 | Disposition: A | Discharge status: Home / Self Care | Payer: Medicare HMO | Source: Ambulatory Visit | Attending: Family | Admitting: Family

## 2013-10-26 ENCOUNTER — Ambulatory Visit (INDEPENDENT_AMBULATORY_CARE_PROVIDER_SITE_OTHER): Payer: Medicare HMO | Admitting: Family

## 2013-10-26 ENCOUNTER — Encounter: Payer: Self-pay | Admitting: Family

## 2013-10-26 VITALS — BP 140/77 | HR 70 | Resp 16 | Ht 70.5 in | Wt 151.0 lb

## 2013-10-26 DIAGNOSIS — I714 Abdominal aortic aneurysm, without rupture, unspecified: Secondary | ICD-10-CM | POA: Insufficient documentation

## 2013-10-26 DIAGNOSIS — Z48812 Encounter for surgical aftercare following surgery on the circulatory system: Secondary | ICD-10-CM | POA: Insufficient documentation

## 2013-10-26 NOTE — Addendum Note (Signed)
Addended by: Mena Goes on: 10/26/2013 11:57 AM   Modules accepted: Orders

## 2013-10-26 NOTE — Patient Instructions (Addendum)
Abdominal Aortic Aneurysm An aneurysm is a weakened or damaged part of an artery wall that bulges from the normal force of blood pumping through the body. An abdominal aortic aneurysm is an aneurysm that occurs in the lower part of the aorta, the main artery of the body.  The major concern with an abdominal aortic aneurysm is that it can enlarge and burst (rupture) or blood can flow between the layers of the wall of the aorta through a tear (aorticdissection). Both of these conditions can cause bleeding inside the body and can be life threatening unless diagnosed and treated promptly. CAUSES  The exact cause of an abdominal aortic aneurysm is unknown. Some contributing factors are:   A hardening of the arteries caused by the buildup of fat and other substances in the lining of a blood vessel (arteriosclerosis).  Inflammation of the walls of an artery (arteritis).   Connective tissue diseases, such as Marfan syndrome.   Abdominal trauma.   An infection, such as syphilis or staphylococcus, in the wall of the aorta (infectious aortitis) caused by bacteria. RISK FACTORS  Risk factors that contribute to an abdominal aortic aneurysm may include:  Age older than 60 years.   High blood pressure (hypertension).  Male gender.  Ethnicity (white race).  Obesity.  Family history of aneurysm (first degree relatives only).  Tobacco use. PREVENTION  The following healthy lifestyle habits may help decrease your risk of abdominal aortic aneurysm:  Quitting smoking. Smoking can raise your blood pressure and cause arteriosclerosis.  Limiting or avoiding alcohol.  Keeping your blood pressure, blood sugar level, and cholesterol levels within normal limits.  Decreasing your salt intake. In somepeople, too much salt can raise blood pressure and increase your risk of abdominal aortic aneurysm.  Eating a diet low in saturated fats and cholesterol.  Increasing your fiber intake by including  whole grains, vegetables, and fruits in your diet. Eating these foods may help lower blood pressure.  Maintaining a healthy weight.  Staying physically active and exercising regularly. SYMPTOMS  The symptoms of abdominal aortic aneurysm may vary depending on the size and rate of growth of the aneurysm.Most grow slowly and do not have any symptoms. When symptoms do occur, they may include:  Pain (abdomen, side, lower back, or groin). The pain may vary in intensity. A sudden onset of severe pain may indicate that the aneurysm has ruptured.  Feeling full after eating only small amounts of food.  Nausea or vomiting or both.  Feeling a pulsating lump in the abdomen.  Feeling faint or passing out. DIAGNOSIS  Since most unruptured abdominal aortic aneurysms have no symptoms, they are often discovered during diagnostic exams for other conditions. An aneurysm may be found during the following procedures:  Ultrasonography (A one-time screening for abdominal aortic aneurysm by ultrasonography is also recommended for all men aged 65-75 years who have ever smoked).  X-ray exams.  A computed tomography (CT).  Magnetic resonance imaging (MRI).  Angiography or arteriography. TREATMENT  Treatment of an abdominal aortic aneurysm depends on the size of your aneurysm, your age, and risk factors for rupture. Medication to control blood pressure and pain may be used to manage aneurysms smaller than 6 cm. Regular monitoring for enlargement may be recommended by your caregiver if:  The aneurysm is 3 4 cm in size (an annual ultrasonography may be recommended).  The aneurysm is 4 4.5 cm in size (an ultrasonography every 6 months may be recommended).  The aneurysm is larger than 4.5   cm in size (your caregiver may ask that you be examined by a vascular surgeon). If your aneurysm is larger than 6 cm, surgical repair may be recommended. There are two main methods for repair of an aneurysm:   Endovascular  repair (a minimally invasive surgery). This is done most often.  Open repair. This method is used if an endovascular repair is not possible. Document Released: 07/18/2005 Document Revised: 02/02/2013 Document Reviewed: 11/07/2012 ExitCare Patient Information 2014 [employer].   Smoking Cessation Quitting smoking is important to your health and has many advantages. However, it is not always easy to quit since nicotine is a very addictive drug. Often times, people try 3 times or more before being able to quit. This document explains the best ways for you to prepare to quit smoking. Quitting takes hard work and a lot of effort, but you can do it. ADVANTAGES OF QUITTING SMOKING  You will live longer, feel better, and live better.  Your body will feel the impact of quitting smoking almost immediately.  Within 20 minutes, blood pressure decreases. Your pulse returns to its normal level.  After 8 hours, carbon monoxide levels in the blood return to normal. Your oxygen level increases.  After 24 hours, the chance of having a heart attack starts to decrease. Your breath, hair, and body stop smelling like smoke.  After 48 hours, damaged nerve endings begin to recover. Your sense of taste and smell improve.  After 72 hours, the body is virtually free of nicotine. Your bronchial tubes relax and breathing becomes easier.  After 2 to 12 weeks, lungs can hold more air. Exercise becomes easier and circulation improves.  The risk of having a heart attack, stroke, cancer, or lung disease is greatly reduced.  After 1 year, the risk of coronary heart disease is cut in half.  After 5 years, the risk of stroke falls to the same as a nonsmoker.  After 10 years, the risk of lung cancer is cut in half and the risk of other cancers decreases significantly.  After 15 years, the risk of coronary heart disease drops, usually to the level of a nonsmoker.  If you are pregnant, quitting smoking will improve  your chances of having a healthy baby.  The people you live with, especially any children, will be healthier.  You will have extra money to spend on things other than cigarettes. QUESTIONS TO THINK ABOUT BEFORE ATTEMPTING TO QUIT You may want to talk about your answers with your caregiver.  Why do you want to quit?  If you tried to quit in the past, what helped and what did not?  What will be the most difficult situations for you after you quit? How will you plan to handle them?  Who can help you through the tough times? Your family? Friends? A caregiver?  What pleasures do you get from smoking? What ways can you still get pleasure if you quit? Here are some questions to ask your caregiver:  How can you help me to be successful at quitting?  What medicine do you think would be best for me and how should I take it?  What should I do if I need more help?  What is smoking withdrawal like? How can I get information on withdrawal? GET READY  Set a quit date.  Change your environment by getting rid of all cigarettes, ashtrays, matches, and lighters in your home, car, or work. Do not let people smoke in your home.  Review your   past attempts to quit. Think about what worked and what did not. GET SUPPORT AND ENCOURAGEMENT You have a better chance of being successful if you have help. You can get support in many ways.  Tell your family, friends, and co-workers that you are going to quit and need their support. Ask them not to smoke around you.  Get individual, group, or telephone counseling and support. Programs are available at local hospitals and health centers. Call your local health department for information about programs in your area.  Spiritual beliefs and practices may help some smokers quit.  Download a "quit meter" on your computer to keep track of quit statistics, such as how long you have gone without smoking, cigarettes not smoked, and money saved.  Get a self-help  book about quitting smoking and staying off of tobacco. LEARN NEW SKILLS AND BEHAVIORS  Distract yourself from urges to smoke. Talk to someone, go for a walk, or occupy your time with a task.  Change your normal routine. Take a different route to work. Drink tea instead of coffee. Eat breakfast in a different place.  Reduce your stress. Take a hot bath, exercise, or read a book.  Plan something enjoyable to do every day. Reward yourself for not smoking.  Explore interactive web-based programs that specialize in helping you quit. GET MEDICINE AND USE IT CORRECTLY Medicines can help you stop smoking and decrease the urge to smoke. Combining medicine with the above behavioral methods and support can greatly increase your chances of successfully quitting smoking.  Nicotine replacement therapy helps deliver nicotine to your body without the negative effects and risks of smoking. Nicotine replacement therapy includes nicotine gum, lozenges, inhalers, nasal sprays, and skin patches. Some may be available over-the-counter and others require a prescription.  Antidepressant medicine helps people abstain from smoking, but how this works is unknown. This medicine is available by prescription.  Nicotinic receptor partial agonist medicine simulates the effect of nicotine in your brain. This medicine is available by prescription. Ask your caregiver for advice about which medicines to use and how to use them based on your health history. Your caregiver will tell you what side effects to look out for if you choose to be on a medicine or therapy. Carefully read the information on the package. Do not use any other product containing nicotine while using a nicotine replacement product.  RELAPSE OR DIFFICULT SITUATIONS Most relapses occur within the first 3 months after quitting. Do not be discouraged if you start smoking again. Remember, most people try several times before finally quitting. You may have symptoms  of withdrawal because your body is used to nicotine. You may crave cigarettes, be irritable, feel very hungry, cough often, get headaches, or have difficulty concentrating. The withdrawal symptoms are only temporary. They are strongest when you first quit, but they will go away within 10 14 days. To reduce the chances of relapse, try to:  Avoid drinking alcohol. Drinking lowers your chances of successfully quitting.  Reduce the amount of caffeine you consume. Once you quit smoking, the amount of caffeine in your body increases and can give you symptoms, such as a rapid heartbeat, sweating, and anxiety.  Avoid smokers because they can make you want to smoke.  Do not let weight gain distract you. Many smokers will gain weight when they quit, usually less than 10 pounds. Eat a healthy diet and stay active. You can always lose the weight gained after you quit.  Find ways to improve your   mood other than smoking. FOR MORE INFORMATION  www.smokefree.gov  Document Released: 10/02/2001 Document Revised: 04/08/2012 Document Reviewed: 01/17/2012 ExitCare Patient Information 2014 [employer].  

## 2013-10-26 NOTE — Progress Notes (Signed)
[facility]  Established [facility]  History of Present Illness  Karl Walker is a 80 y.o. (12-Jul-1932) male patient of Dr. 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 His initial CT scan showed the graft to be in good position without evidence of endoleak.  At his visit 6 months ago,  Dr. Trula Slade reviewed his carotid duplex from pre-CABG which showed no significant carotid disease.  Is regularly physically active, but states his cardiologist told him to not engage in strenuous activity. Had one MI in 2006, one cardiac stent.  He presents today for routine surveillance of the EVAR.  The patient has not had back or abdominal pain.  He had melanoma removed left shoulder last year, several basal cell carcinomas removed on face.  Pt Diabetic: No Pt smoker: smoker  (3-4 cigarettes/day x 48 yrs off and on)   Past Medical History  Diagnosis Date  . Urinary frequency   . Bronchitis   . COPD (chronic obstructive pulmonary disease)   . Hypercholesterolemia     takes Niacin nightly  . CAD (coronary artery disease) stent in 2007  . Tracheal mass     s/p endoscopic resection  . Hypertension     takes Hyzaar daily  . MI (myocardial infarction) 1996  . Chest congestion     occasionally per pt and wife  . Shortness of breath     with exertion  . Pneumonia     last time 89yrs ago  . Bruises easily   . Dry skin   . Hx of colonic polyps   . Enlarged prostate     takes Proscar  . Abdominal aneurysm     scheduled procedure to take care of this 07/04/12 with Dr.Brabham  . Cancer     basal cell ca on face   Past Surgical History  Procedure Laterality Date  . Skin surgery    . Hernia repair      right inguinal  . Coronary angioplasty with stent placement  2006  . Colonoscopy    . Esophagogastroduodenoscopy  Oct 2011    RMR: normal esophagus, J-shaped stomach, mottled gastric mucosa, bulbar erosions: path minimal chronic gastritis  .  Video bronchoscopy  06/25/2012    Procedure: VIDEO BRONCHOSCOPY;  Surgeon: Melrose Nakayama, MD;  Location: [facility];  Service: Thoracic;  Laterality: N/A;  Using Snare with biopsy  . Tumor removal  2013    esophagus  . Abdominal aortic aneurysm repair  07-04-12    EVAR  . Colonoscopy  Oct 2011    RMR: multiple colon polyps, left-sided diverticula, adenomatous, needs surveillance Oct 2014  . US echocardiography  05/24/2009    mild AI,MR  . Nm myocar perf wall motion  06/18/2012    No ischemia  . Coronary angioplasty with stent placement  12/17/2006    Stent to the RCA   Social History History  Substance Use Topics  . Smoking status: Current Some Day Smoker -- 0.50 packs/day for 55 years    Types: Cigarettes  . Smokeless tobacco: Never Used  . Alcohol Use: No   Family History Family History  Problem Relation Age of Onset  . Stroke Brother    Current Outpatient Prescriptions on File Prior to Visit  Medication Sig Dispense Refill  . albuterol (PROVENTIL) (2.5 MG/3ML) 0.083% nebulizer solution Take 2.5 mg by nebulization daily as needed for wheezing or shortness of breath. And/or Congestion      . aspirin 81 MG  tablet Take 81 mg by mouth daily.        . cholecalciferol (VITAMIN D) 400 UNITS TABS Take 400 Units by mouth daily.      . finasteride (PROSCAR) 5 MG tablet Take 5 mg by mouth daily.      Marland Kitchen losartan-hydrochlorothiazide (HYZAAR) 50-12.5 MG per tablet Take 1 tablet by mouth daily.        . niacin 500 MG tablet Take 500 mg by mouth at bedtime.      . vitamin C (ASCORBIC ACID) 500 MG tablet Take 500 mg by mouth daily.       No current facility-administered medications on file prior to visit.   Allergies  Allergen Reactions  . Iodine     Per pt, issue with shrimp x 20 years ago, ok with CT contrast since     ROS: See HPI for pertinent positives and negatives.  Physical Examination  Filed Vitals:   10/26/13 1015  BP: 140/77  Pulse: 70  Resp: 16   Filed Weights    10/26/13 1015  Weight: 151 lb (68.493 kg)   Body mass index is 21.35 kg/(m^2).  General: A&O x 3, WD.  Pulmonary: Sym exp, decreased air movement in all fieldst, CTAB, no rales, rhonchi, positive expiratory wheezing in left posterior fields.  Cardiac: RRR, Nl S1, S2, no Murmurs detected.  Vascular: Vessel Right Left  Radial 2+Palpable 2+Palpable  Carotid  without bruit  without bruit  Aorta Not palpable N/A  Femoral Palpable Palpable  Popliteal Not palpable Not palpable  PT Palpable Palpable  DP Palpable Palpable   Gastrointestinal: soft, NTND, -G/R, - HSM, - masses, - CVAT B, slightly palpable aorta.  Musculoskeletal: M/S 5/5 throughout, Extremities without ischemic changes.  Neurologic: Pain and light touch intact in extremities, Motor exam as listed above, CN 2-12 intact except for hard of hearing.   Non-Invasive Vascular Imaging  EVAR Duplex (Date: 10/26/2013)  AAA sac size: 4.54 cm x 4.59 cm  no endoleak detected  Previous EVAR  04/06/13: 4.8 cm x 4.7 cm  Karl Walker is a 80 y.o. male who presents s/p EVAR.  Pt is asymptomatic with decrease in sac size.  I discussed with the patient the importance of surveillance of the endograft.  The next endograft duplex will be scheduled for 12 months..  The patient will follow up with [place] in 12 months with these studies.  I emphasized the importance of maximal medical management including strict control of blood pressure, blood glucose, and lipid levels, antiplatelet agents, obtaining regular exercise, and cessation of smoking.   The patient was given information about AAA including signs, symptoms, treatment, and how to minimize the risk of enlargement and rupture of aneurysms.  The patient was counseled re smoking cessation.    Thank you for allowing [place] to participate in this patient's care.  Clemon Chambers, RN, MSN, FNP-C [facility] Office: [phone]  Clinic Physician:  Trula Slade  10/26/2013, 9:40 AM

## 2013-11-20 ENCOUNTER — Telehealth: Payer: Self-pay | Admitting: Cardiovascular Disease

## 2013-11-20 ENCOUNTER — Ambulatory Visit (INDEPENDENT_AMBULATORY_CARE_PROVIDER_SITE_OTHER): Payer: Medicare HMO | Admitting: Urology

## 2013-11-20 DIAGNOSIS — N3020 Other chronic cystitis without hematuria: Secondary | ICD-10-CM

## 2013-11-20 DIAGNOSIS — R82998 Other abnormal findings in urine: Secondary | ICD-10-CM

## 2013-11-20 DIAGNOSIS — N401 Enlarged prostate with lower urinary tract symptoms: Secondary | ICD-10-CM

## 2013-11-20 NOTE — Telephone Encounter (Signed)
He can stop the Plavix a week before and the week after the planned procedure.

## 2013-11-20 NOTE — Telephone Encounter (Signed)
Need clarence for a bladder biopsy and TURP of the prostate please. Pt is on Plavix,waants to know if he can stop it a week before and a week after surgery?

## 2013-11-20 NOTE — Telephone Encounter (Signed)
Forward to DR [facility]

## 2013-11-23 NOTE — Telephone Encounter (Signed)
Called left message- clearance for TURP Letter done.-sent epic

## 2013-11-25 ENCOUNTER — Other Ambulatory Visit: Payer: Self-pay | Admitting: Urology

## 2013-12-01 ENCOUNTER — Encounter (HOSPITAL_COMMUNITY): Payer: Self-pay | Admitting: Pharmacy Technician

## 2013-12-02 ENCOUNTER — Encounter (HOSPITAL_COMMUNITY): Payer: Self-pay

## 2013-12-02 ENCOUNTER — Encounter (HOSPITAL_COMMUNITY)
Admission: RE | Admit: 2013-12-02 | Discharge: 2013-12-02 | Disposition: A | Discharge status: Home / Self Care | Payer: Medicare HMO | Source: Ambulatory Visit | Attending: Urology | Admitting: Urology

## 2013-12-02 DIAGNOSIS — Z01812 Encounter for preprocedural laboratory examination: Secondary | ICD-10-CM | POA: Insufficient documentation

## 2013-12-02 LAB — BASIC METABOLIC PANEL
BUN: 20 mg/dL (ref 6–23)
CO2: 28 meq/L (ref 19–32)
CREATININE: 1.19 mg/dL (ref 0.50–1.35)
Calcium: 9.4 mg/dL (ref 8.4–10.5)
Chloride: 104 mEq/L (ref 96–112)
GFR calc Af Amer: 65 mL/min — ABNORMAL LOW (ref >90)
GFR calc non Af Amer: 56 mL/min — ABNORMAL LOW (ref >90)
Glucose, Bld: 94 mg/dL (ref 70–99)
Potassium: 5.1 mEq/L (ref 3.7–5.3)
Sodium: 141 mEq/L (ref 137–147)

## 2013-12-02 LAB — CBC
HEMATOCRIT: 48.6 % (ref 39.0–52.0)
HEMOGLOBIN: 16.5 g/dL (ref 13.0–17.0)
MCH: 31.5 pg (ref 26.0–34.0)
MCHC: 34 g/dL (ref 30.0–36.0)
MCV: 92.7 fL (ref 78.0–100.0)
Platelets: 212 10*3/uL (ref 150–400)
RBC: 5.24 MIL/uL (ref 4.22–5.81)
RDW: 14.5 % (ref 11.5–15.5)
WBC: 6.9 10*3/uL (ref 4.0–10.5)

## 2013-12-02 NOTE — Pre-Procedure Instructions (Signed)
CARDIOLOGY OFFICE VISIT 05/28/13 WITH EKG IN EPIC FROM DR. CROITORU.  CT CHEST REPORT 07/21/13 IN EPIC.

## 2013-12-02 NOTE — Progress Notes (Signed)
12/02/13 1145  OBSTRUCTIVE SLEEP APNEA  Have you ever been diagnosed with sleep apnea through a sleep study? No  Do you snore loudly (loud enough to be heard through closed doors)?  1  Do you often feel tired, fatigued, or sleepy during the daytime? 0  Has anyone observed you stop breathing during your sleep? 0  Do you have, or are you being treated for high blood pressure? 1  BMI more than 35 kg/m2? 0  Age over 78 years old? 1  Neck circumference greater than 40 cm/18 inches? 0  Gender: 1  Obstructive Sleep Apnea Score 4  Score 4 or greater  Results sent to PCP

## 2013-12-02 NOTE — Patient Instructions (Signed)
   YOUR SURGERY IS SCHEDULED AT [facility]  ON:   Thursday  2/19  REPORT TO  SHORT STAY CENTER AT:  5:30 AM      PHONE # FOR SHORT STAY IS [phone]  DO NOT EAT OR DRINK ANYTHING AFTER MIDNIGHT THE NIGHT BEFORE YOUR SURGERY.  YOU MAY BRUSH YOUR TEETH, RINSE OUT YOUR MOUTH--BUT NO WATER, NO FOOD, NO CHEWING GUM, NO MINTS, NO CANDIES, NO CHEWING TOBACCO.  PLEASE TAKE THE FOLLOWING MEDICATIONS THE AM OF YOUR SURGERY WITH A FEW SIPS OF WATER:  FINASTERIDE ( PROSCAR ), AND USE YOUR ALBUTEROL NEBULIZER.   DO NOT BRING VALUABLES, MONEY, CREDIT CARDS.  DO NOT WEAR JEWELRY, MAKE-UP, NAIL POLISH AND NO METAL PINS OR CLIPS IN YOUR HAIR. CONTACT LENS, DENTURES / PARTIALS, GLASSES SHOULD NOT BE WORN TO SURGERY AND IN MOST CASES-HEARING AIDS WILL NEED TO BE REMOVED.  BRING YOUR GLASSES CASE, ANY EQUIPMENT NEEDED FOR YOUR CONTACT LENS. FOR PATIENTS ADMITTED TO THE HOSPITAL--CHECK OUT TIME THE DAY OF DISCHARGE IS 11:00 AM.  ALL INPATIENT ROOMS ARE PRIVATE - WITH BATHROOM, TELEPHONE, TELEVISION AND WIFI INTERNET.                                                     FAILURE TO FOLLOW THESE INSTRUCTIONS MAY RESULT IN THE CANCELLATION OF YOUR SURGERY. PLEASE BE AWARE THAT YOU MAY NEED ADDITIONAL BLOOD DRAWN DAY OF YOUR SURGERY  PATIENT SIGNATURE_________________________________

## 2013-12-09 ENCOUNTER — Encounter (HOSPITAL_COMMUNITY): Payer: Self-pay | Admitting: Urology

## 2013-12-09 DIAGNOSIS — N323 Diverticulum of bladder: Secondary | ICD-10-CM | POA: Diagnosis present

## 2013-12-09 DIAGNOSIS — N401 Enlarged prostate with lower urinary tract symptoms: Secondary | ICD-10-CM

## 2013-12-09 DIAGNOSIS — N138 Other obstructive and reflux uropathy: Secondary | ICD-10-CM

## 2013-12-09 HISTORY — DX: Other obstructive and reflux uropathy: N13.8

## 2013-12-09 NOTE — H&P (Signed)
tive Problems  1. Benign prostatic hypertrophy without lower urinary tract symptoms (600.00)  2. Chronic cystitis (595.2)  3. Elevated prostate specific antigen (PSA) (790.93)  4. Erectile dysfunction due to arterial insufficiency (607.84)  5. Nodular prostate with lower urinary tract symptoms (600.11)  6. Pyuria (791.9)  History of Present Illness  Karl Walker returns today in f/u for further evaluation of his persistent pyuria.  His last culture grew a staph and he was given bactrim ds.  he has 2 pills left.  His UA today has 7-10 WBC's.   Past Medical History  1. History of Asthma (493.90)  2. History of Basal Cell Carcinoma Of The Skin Of The Face (173.31)  3. History of Coronary Artery Disease (V12.59)  4. History of hypertension (V12.59)  5. History of peripheral vascular disease (V12.59)  6. 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 History of Biopsy Of The Prostate Needle  2. History of Biopsy Skin  3. History of Cath Stent Placement  4. History of Cath Stent Placement  5. History of Dermatological Surgery  6. History of Inguinal Hernia Repair  7. History of Throat Surgery  8. History of Transcath Intravascular Stent Placement Percutaneous Femoral  Current Meds  1. Aspirin 325 MG Oral Tablet;  Therapy: (Recorded:04May2012) to Recorded  2. BL Vitamin C 500 MG TABS;  Therapy: (Recorded:15Feb2008) to Recorded  3. Finasteride 5 MG Oral Tablet; TAKE 1 TABLET DAILY  Requested for: 31Oct2014; Last  Rx:31Oct2014 Ordered  4. Fish Oil CAPS;  Therapy: (Recorded:22Feb2010) to Recorded  5. Losartan Potassium-HCTZ 50-12.5 MG Oral Tablet;  Therapy: (Recorded:04May2012) to Recorded  6. Niaspan TBCR;  Therapy: (Recorded:22Feb2010) to Recorded  7. Plavix TABS;  Therapy: (Recorded:22Feb2010) to Recorded  8. Simvastatin TABS;  Therapy: (Recorded:22Feb2010) to Recorded  9. Sulfamethoxazole-TMP DS 800-160 MG Oral Tablet; take 1 tab  bid x 1 week ,  then go to  1  nightly;  Therapy: [id number] to (Last Rx:05Jan2015)  Requested for: [id number] Ordered  10. V-R Vitamin E CAPS;   Therapy: (Recorded:15Feb2008) to Recorded  Allergies  1. Iodine SOLN  Family History  1. Family history of Family Health Status Number Of Children  Social History  1. Denied: Alcohol Use  2. Caffeine Use   1  3. Current every day smoker (305.1)  4. Family history of Death In The Family Father   68  5. Family history of Death In The Family Mother   Apr 10, 1988. Marital History - Currently Married  7. Occupation:   retired  78. Tobacco Use (V15.82)   1/2 ppd for 40 years  Review of Systems  Cardiovascular: no chest pain.  Respiratory: shortness of breath.    Vitals Vital Signs [Data Includes: Last 1 Day]  Recorded: [id number] 01:55PM  Weight: 155 lb  BMI Calculated: 21.62 BSA Calculated: 1.89 Blood Pressure: 172 / 75 Temperature: 98.4 F Heart Rate: 80  Physical Exam Constitutional: Well nourished and well developed . No acute distress.  Pulmonary: No respiratory distress and normal respiratory rhythm and effort.  Cardiovascular: Heart rate and rhythm are normal . No peripheral edema.    Results/Data Urine [Data Includes: Last 1 Day]   [id number]  COLOR YELLOW   APPEARANCE CLEAR   SPECIFIC GRAVITY 1.025   pH 6.0   GLUCOSE NEG mg/dL  BILIRUBIN NEG   KETONE NEG mg/dL  BLOOD NEG   PROTEIN NEG mg/dL  UROBILINOGEN 0.2 mg/dL  NITRITE NEG   LEUKOCYTE ESTERASE SMALL   SQUAMOUS EPITHELIAL/HPF RARE  WBC 7-10 WBC/hpf  RBC 0-2 RBC/hpf  BACTERIA RARE   CRYSTALS NONE SEEN   CASTS NONE SEEN   Other MUCUS    The following clinical lab reports were reviewed:  UA and culture reviewed.  Flow Rate: Instilled volume 300 ml . Voided 104 ml. A peak flow rate of 52ml/s, mean flow rate of 53ml/s and flat and sustained. flow curve . Selected Results  URINE CULTURE 02Jan2015 04:10PM [name]  SOURCE : VOIDED URINE   Test Name Result Flag Reference  CULTURE, URINE  Culture, Urine    ===== COLONY COUNT: =====  >=100,000 COLONIES/ML   FINAL REPORT: STAPHYLOCOCCUS SPECIES (COAGULASE NEGATIVE)  Rifampin and Gentamicin should not be used as single drugs for treatment of Staph infections.   Sensitivity for: STAPHYLOCOCCUS SPECIES (COAGULASE NEGATIVE)    PENICILLIN                             Resistant               OXACILLIN                              Sensitive               CEFAZOLIN                              Sensitive               GENTAMICIN                             Sensitive      <=0.5    CIPROFLOXACIN                          Sensitive      <=0.5    LEVOFLOXACIN                           Sensitive       0.25    NITROFURANTOIN                         Sensitive       <=16    TRIMETH/SULFA                          Sensitive       <=10    VANCOMYCIN                             Sensitive      <=0.5    RIFAMPIN                               Sensitive      <=0.5    TETRACYCLINE                           Sensitive        <=1    END OF REPORT   Procedure  Procedure: Cystoscopy   Indication: Lower Urinary Tract Symptoms. Frequent UTI.  Informed Consent:  Risks, benefits, and potential adverse events were discussed and informed consent was obtained from the patient.  Prep: The patient was prepped with betadine.  Antibiotic prophylaxis: Trimethoprim/Sulfamethoxazole.  Procedure Note:  Urethral meatus:. No abnormalities.  Anterior urethra: No abnormalities.  Prostatic urethra: No abnormalities . Estimated length was 5 cm. There was visual obstruction of the prostatic urethra. The lateral and median prostatic lobes were enlarged. An enlarged intravesical median lobe was visualized.  Bladder: Visulization was clear. The ureteral orifices were in the normal anatomic position bilaterally and had clear efflux of urine. A systematic survey of the bladder demonstrated no bladder tumors or stones. The mucosa was smooth without abnormalities. Examination  of the bladder demonstrated moderate trabeculation and a diverticulum located on the posterior aspect of the bladder measuring approximately 3 cm with erythema in the tic that is worrisome for CIS vs chronic cystitis. cellules. The patient tolerated the procedure well.  Complications: None.    Assessment  1. Chronic cystitis (595.2)  2. Benign prostatic hypertrophy without lower urinary tract symptoms (600.00)  3. Pyuria (791.9)    He has possible CIS vis chronic cystitis on cystoscopy and has marked bladder outlet obstruction with a very slow stream and incomplete emptying.   Plan Benign prostatic hypertrophy without lower urinary tract symptoms   1. UA With REFLEX; [Do Not Release]; Status:Resulted - Requires Verification;   Done:  [id number] 02:01PM Chronic cystitis   2. URINE CULTURE; Status:Hold For - Specimen/Data Collection,Appointment; Requested  YQM:[id number];   3. URINE CYTOLOGY; Status:Hold For - Specimen/Data Collection,Appointment;  Requested BMW:[id number];   4. Follow-up Schedule Surgery Office  Follow-up  Status: Hold For - Appointment   Requested for: [id number] Pyuria   5. Changed: From  Sulfamethoxazole-TMP DS 800-160 MG Oral Tablet take 1 tab  bid x 1  week ,  then go to 1 nightly To Sulfamethoxazole-TMP DS 800-160 MG Oral Tablet  (Bactrim DS) TAKE 1 TABLET qHS   I am going to keep him on the bactrim for suppression. Urine culture today. He needs to be set up for cystoscopy with bil RTG's, TURP and bladder biopsy.  I will do the biopsy after the TURP because the lesion is in the diverticulum which will have a very thin wall.   Urine cytology today. I will get Cardiac and Vascular clearance.  I have reviewed the risks of the procedure in detail including bleeding, infection, bladder injury, incontinence, sexual dysfunction, thrombotic events and anesthetic complications.     Discussion/Summary  CC: Dr. Sharilyn Sites,  Dr. Annamarie Major and [facility].

## 2013-12-10 ENCOUNTER — Encounter (HOSPITAL_COMMUNITY): Payer: Medicare HMO | Admitting: Certified Registered"

## 2013-12-10 ENCOUNTER — Encounter (HOSPITAL_COMMUNITY): Payer: Self-pay | Admitting: *Deleted

## 2013-12-10 ENCOUNTER — Ambulatory Visit (HOSPITAL_COMMUNITY): Payer: Medicare HMO | Admitting: Certified Registered"

## 2013-12-10 ENCOUNTER — Encounter (HOSPITAL_COMMUNITY)
Admission: RE | Disposition: A | Discharge status: Home / Self Care | Payer: Self-pay | Source: Ambulatory Visit | Attending: Urology

## 2013-12-10 ENCOUNTER — Inpatient Hospital Stay (HOSPITAL_COMMUNITY)
Admission: RE | Admit: 2013-12-10 | Discharge: 2013-12-12 | DRG: 713 | Disposition: A | Discharge status: Home / Self Care | Payer: Medicare HMO | Source: Ambulatory Visit | Attending: Urology | Admitting: Urology

## 2013-12-10 DIAGNOSIS — J45909 Unspecified asthma, uncomplicated: Secondary | ICD-10-CM | POA: Diagnosis present

## 2013-12-10 DIAGNOSIS — N401 Enlarged prostate with lower urinary tract symptoms: Secondary | ICD-10-CM | POA: Diagnosis not present

## 2013-12-10 DIAGNOSIS — I2510 Atherosclerotic heart disease of native coronary artery without angina pectoris: Secondary | ICD-10-CM | POA: Diagnosis present

## 2013-12-10 DIAGNOSIS — N138 Other obstructive and reflux uropathy: Principal | ICD-10-CM | POA: Diagnosis present

## 2013-12-10 DIAGNOSIS — Z79899 Other long term (current) drug therapy: Secondary | ICD-10-CM

## 2013-12-10 DIAGNOSIS — Z7982 Long term (current) use of aspirin: Secondary | ICD-10-CM

## 2013-12-10 DIAGNOSIS — N320 Bladder-neck obstruction: Secondary | ICD-10-CM | POA: Diagnosis present

## 2013-12-10 DIAGNOSIS — N3289 Other specified disorders of bladder: Secondary | ICD-10-CM | POA: Diagnosis not present

## 2013-12-10 DIAGNOSIS — Y836 Removal of other organ (partial) (total) as the cause of abnormal reaction of the patient, or of later complication, without mention of misadventure at the time of the procedure: Secondary | ICD-10-CM | POA: Diagnosis not present

## 2013-12-10 DIAGNOSIS — R972 Elevated prostate specific antigen [PSA]: Secondary | ICD-10-CM | POA: Diagnosis present

## 2013-12-10 DIAGNOSIS — Z23 Encounter for immunization: Secondary | ICD-10-CM

## 2013-12-10 DIAGNOSIS — I739 Peripheral vascular disease, unspecified: Secondary | ICD-10-CM | POA: Diagnosis present

## 2013-12-10 DIAGNOSIS — N323 Diverticulum of bladder: Secondary | ICD-10-CM | POA: Diagnosis present

## 2013-12-10 DIAGNOSIS — D62 Acute posthemorrhagic anemia: Secondary | ICD-10-CM | POA: Diagnosis not present

## 2013-12-10 DIAGNOSIS — I10 Essential (primary) hypertension: Secondary | ICD-10-CM | POA: Diagnosis present

## 2013-12-10 DIAGNOSIS — IMO0002 Reserved for concepts with insufficient information to code with codable children: Secondary | ICD-10-CM | POA: Diagnosis not present

## 2013-12-10 DIAGNOSIS — F17200 Nicotine dependence, unspecified, uncomplicated: Secondary | ICD-10-CM | POA: Diagnosis present

## 2013-12-10 DIAGNOSIS — N3020 Other chronic cystitis without hematuria: Secondary | ICD-10-CM | POA: Diagnosis present

## 2013-12-10 DIAGNOSIS — Z85828 Personal history of other malignant neoplasm of skin: Secondary | ICD-10-CM

## 2013-12-10 HISTORY — DX: Benign prostatic hyperplasia with lower urinary tract symptoms: N40.1

## 2013-12-10 HISTORY — PX: TRANSURETHRAL RESECTION OF PROSTATE: SHX73

## 2013-12-10 HISTORY — DX: Diverticulum of bladder: N32.3

## 2013-12-10 HISTORY — DX: Other obstructive and reflux uropathy: N13.8

## 2013-12-10 HISTORY — PX: CYSTOSCOPY W/ RETROGRADES: SHX1426

## 2013-12-10 LAB — HEMOGLOBIN AND HEMATOCRIT, BLOOD
HEMATOCRIT: 49.6 % (ref 39.0–52.0)
Hemoglobin: 16.5 g/dL (ref 13.0–17.0)

## 2013-12-10 SURGERY — TURP (TRANSURETHRAL RESECTION OF PROSTATE)
Anesthesia: General

## 2013-12-10 MED ORDER — LACTATED RINGERS IV SOLN
INTRAVENOUS | Status: DC
  Administered 2013-12-10: 11:00:00 via INTRAVENOUS

## 2013-12-10 MED ORDER — FENTANYL CITRATE 0.05 MG/ML IJ SOLN
INTRAMUSCULAR | Status: DC | PRN
Start: 2013-12-10 — End: 2013-12-10
  Administered 2013-12-10 (×2): 50 ug via INTRAVENOUS

## 2013-12-10 MED ORDER — HYDRALAZINE HCL 20 MG/ML IJ SOLN
5.0000 mg | INTRAMUSCULAR | Status: DC
  Administered 2013-12-10 (×2): 5 mg via INTRAVENOUS

## 2013-12-10 MED ORDER — ONDANSETRON HCL 4 MG/2ML IJ SOLN
INTRAMUSCULAR | Status: AC
  Filled 2013-12-10: qty 2

## 2013-12-10 MED ORDER — HYDROMORPHONE HCL PF 1 MG/ML IJ SOLN
0.2500 mg | INTRAMUSCULAR | Status: DC | PRN
  Administered 2013-12-10 (×4): 0.5 mg via INTRAVENOUS

## 2013-12-10 MED ORDER — ALBUTEROL SULFATE (2.5 MG/3ML) 0.083% IN NEBU
2.5000 mg | INHALATION_SOLUTION | RESPIRATORY_TRACT | Status: DC | PRN
  Administered 2013-12-10 – 2013-12-12 (×4): 2.5 mg via RESPIRATORY_TRACT
  Filled 2013-12-10 (×4): qty 3

## 2013-12-10 MED ORDER — SODIUM CHLORIDE 0.9 % IR SOLN
Status: DC | PRN
  Administered 2013-12-10: 30000 mL via INTRAVESICAL

## 2013-12-10 MED ORDER — ACETAMINOPHEN 325 MG PO TABS: 650.0000 mg | ORAL_TABLET | ORAL | Status: DC | PRN

## 2013-12-10 MED ORDER — PROPOFOL 10 MG/ML IV BOLUS
INTRAVENOUS | Status: AC
  Filled 2013-12-10: qty 20

## 2013-12-10 MED ORDER — MEPERIDINE HCL 50 MG/ML IJ SOLN: 6.2500 mg | INTRAMUSCULAR | Status: DC | PRN

## 2013-12-10 MED ORDER — KCL IN DEXTROSE-NACL 20-5-0.45 MEQ/L-%-% IV SOLN
INTRAVENOUS | Status: DC
  Administered 2013-12-10 – 2013-12-11 (×4): via INTRAVENOUS
  Filled 2013-12-10 (×9): qty 1000

## 2013-12-10 MED ORDER — SODIUM CHLORIDE 0.9 % IR SOLN
3000.0000 mL | Status: AC
  Administered 2013-12-10 (×2): 3000 mL

## 2013-12-10 MED ORDER — PHENYLEPHRINE HCL 10 MG/ML IJ SOLN
INTRAMUSCULAR | Status: DC | PRN
  Administered 2013-12-10: 40 ug via INTRAVENOUS

## 2013-12-10 MED ORDER — HYOSCYAMINE SULFATE 0.125 MG SL SUBL
0.1250 mg | SUBLINGUAL_TABLET | SUBLINGUAL | Status: DC | PRN
  Administered 2013-12-10: 0.125 mg via ORAL
  Filled 2013-12-10: qty 1

## 2013-12-10 MED ORDER — SULFAMETHOXAZOLE-TMP DS 800-160 MG PO TABS
1.0000 | ORAL_TABLET | Freq: Two times a day (BID) | ORAL | Status: DC
  Administered 2013-12-10 – 2013-12-12 (×4): 1 via ORAL
  Filled 2013-12-10 (×6): qty 1

## 2013-12-10 MED ORDER — PROMETHAZINE HCL 25 MG/ML IJ SOLN: 6.2500 mg | INTRAMUSCULAR | Status: DC | PRN

## 2013-12-10 MED ORDER — MIDAZOLAM HCL 2 MG/2ML IJ SOLN
INTRAMUSCULAR | Status: AC
  Administered 2013-12-10 (×2): 1 mg via INTRAVENOUS
  Filled 2013-12-10: qty 2

## 2013-12-10 MED ORDER — LIDOCAINE HCL (CARDIAC) 20 MG/ML IV SOLN
INTRAVENOUS | Status: DC | PRN
  Administered 2013-12-10: 50 mg via INTRAVENOUS

## 2013-12-10 MED ORDER — DOCUSATE SODIUM 100 MG PO CAPS: 100.0000 mg | ORAL_CAPSULE | Freq: Two times a day (BID) | ORAL | Status: DC

## 2013-12-10 MED ORDER — ONDANSETRON HCL 4 MG/2ML IJ SOLN
INTRAMUSCULAR | Status: DC | PRN
  Administered 2013-12-10: 4 mg via INTRAVENOUS

## 2013-12-10 MED ORDER — HYDRALAZINE HCL 20 MG/ML IJ SOLN
INTRAMUSCULAR | Status: AC
  Filled 2013-12-10: qty 1

## 2013-12-10 MED ORDER — OXYCODONE HCL 5 MG/5ML PO SOLN
5.0000 mg | Freq: Once | ORAL | Status: DC | PRN
  Filled 2013-12-10: qty 5

## 2013-12-10 MED ORDER — NIACIN 500 MG PO TABS
500.0000 mg | ORAL_TABLET | Freq: Every day | ORAL | Status: DC
  Administered 2013-12-10 – 2013-12-12 (×2): 500 mg via ORAL
  Filled 2013-12-10 (×3): qty 1

## 2013-12-10 MED ORDER — TRAMADOL HCL 50 MG PO TABS: 50.0000 mg | ORAL_TABLET | Freq: Four times a day (QID) | ORAL | Status: DC | PRN

## 2013-12-10 MED ORDER — DIPHENHYDRAMINE HCL 50 MG/ML IJ SOLN: 12.5000 mg | Freq: Four times a day (QID) | INTRAMUSCULAR | Status: DC | PRN

## 2013-12-10 MED ORDER — FENTANYL CITRATE 0.05 MG/ML IJ SOLN
INTRAMUSCULAR | Status: AC
  Filled 2013-12-10: qty 2

## 2013-12-10 MED ORDER — ONDANSETRON HCL 4 MG/2ML IJ SOLN: 4.0000 mg | INTRAMUSCULAR | Status: DC | PRN

## 2013-12-10 MED ORDER — HYDROMORPHONE HCL PF 1 MG/ML IJ SOLN
INTRAMUSCULAR | Status: AC
  Filled 2013-12-10: qty 1

## 2013-12-10 MED ORDER — SULFAMETHOXAZOLE-TMP DS 800-160 MG PO TABS: 1.0000 | ORAL_TABLET | Freq: Two times a day (BID) | ORAL | Status: DC

## 2013-12-10 MED ORDER — CIPROFLOXACIN IN D5W 400 MG/200ML IV SOLN
INTRAVENOUS | Status: AC
  Filled 2013-12-10: qty 200

## 2013-12-10 MED ORDER — SODIUM CHLORIDE 0.9 % IJ SOLN
INTRAMUSCULAR | Status: AC
  Filled 2013-12-10: qty 10

## 2013-12-10 MED ORDER — CIPROFLOXACIN IN D5W 400 MG/200ML IV SOLN
400.0000 mg | INTRAVENOUS | Status: AC
  Administered 2013-12-10: 400 mg via INTRAVENOUS

## 2013-12-10 MED ORDER — PROPOFOL 10 MG/ML IV BOLUS
INTRAVENOUS | Status: DC | PRN
  Administered 2013-12-10: 160 mg via INTRAVENOUS

## 2013-12-10 MED ORDER — HYDROCODONE-ACETAMINOPHEN 5-325 MG PO TABS: 1.0000 | ORAL_TABLET | ORAL | Status: DC | PRN

## 2013-12-10 MED ORDER — LACTATED RINGERS IV SOLN
INTRAVENOUS | Status: DC | PRN
  Administered 2013-12-10 (×2): via INTRAVENOUS

## 2013-12-10 MED ORDER — DIPHENHYDRAMINE HCL 12.5 MG/5ML PO ELIX: 12.5000 mg | ORAL_SOLUTION | Freq: Four times a day (QID) | ORAL | Status: DC | PRN

## 2013-12-10 MED ORDER — PHENYLEPHRINE 40 MCG/ML (10ML) SYRINGE FOR IV PUSH (FOR BLOOD PRESSURE SUPPORT)
PREFILLED_SYRINGE | INTRAVENOUS | Status: AC
  Filled 2013-12-10: qty 10

## 2013-12-10 MED ORDER — IOHEXOL 300 MG/ML  SOLN
INTRAMUSCULAR | Status: DC | PRN
  Administered 2013-12-10: 19 mL

## 2013-12-10 MED ORDER — MIDAZOLAM HCL 2 MG/2ML IJ SOLN
1.0000 mg | INTRAMUSCULAR | Status: DC
  Administered 2013-12-10: 1 mg via INTRAVENOUS

## 2013-12-10 MED ORDER — DOCUSATE SODIUM 100 MG PO CAPS
100.0000 mg | ORAL_CAPSULE | Freq: Two times a day (BID) | ORAL | Status: DC
  Administered 2013-12-10 – 2013-12-12 (×4): 100 mg via ORAL
  Filled 2013-12-10 (×6): qty 1

## 2013-12-10 MED ORDER — LIDOCAINE HCL (CARDIAC) 20 MG/ML IV SOLN
INTRAVENOUS | Status: AC
Start: 2013-12-10 — End: 2013-12-10
  Filled 2013-12-10: qty 5

## 2013-12-10 MED ORDER — HYDROMORPHONE HCL PF 1 MG/ML IJ SOLN: 0.5000 mg | INTRAMUSCULAR | Status: DC | PRN

## 2013-12-10 MED ORDER — OXYCODONE HCL 5 MG PO TABS: 5.0000 mg | ORAL_TABLET | Freq: Once | ORAL | Status: DC | PRN

## 2013-12-10 SURGICAL SUPPLY — 21 items
BAG URINE DRAINAGE (UROLOGICAL SUPPLIES) ×4 IMPLANT
BAG URO CATCHER STRL LF (DRAPE) ×4 IMPLANT
BLADE SURG 15 STRL LF DISP TIS (BLADE) IMPLANT
BLADE SURG 15 STRL SS (BLADE)
CATH FOLEY 3WAY 30CC 22FR (CATHETERS) ×4 IMPLANT
CATH URET 5FR 28IN OPEN ENDED (CATHETERS) ×4 IMPLANT
ELECT LOOP MED HF 24F 12D CBL (CLIP) ×4 IMPLANT
ELECT REM PT RETURN 9FT ADLT (ELECTROSURGICAL)
ELECTRODE REM PT RTRN 9FT ADLT (ELECTROSURGICAL) IMPLANT
GLOVE SURG SS PI 8.0 STRL IVOR (GLOVE) IMPLANT
GOWN STRL REUS W/TWL XL LVL3 (GOWN DISPOSABLE) ×4 IMPLANT
HOLDER FOLEY CATH W/STRAP (MISCELLANEOUS) IMPLANT
KIT ASPIRATION TUBING (SET/KITS/TRAYS/PACK) ×4 IMPLANT
LOOPS RESECTOSCOPE DISP (ELECTROSURGICAL) IMPLANT
MANIFOLD NEPTUNE II (INSTRUMENTS) ×4 IMPLANT
PACK CYSTO (CUSTOM PROCEDURE TRAY) ×4 IMPLANT
SUT ETHILON 3 0 PS 1 (SUTURE) IMPLANT
SYR 30ML LL (SYRINGE) IMPLANT
SYRINGE IRR TOOMEY STRL 70CC (SYRINGE) ×4 IMPLANT
TUBING CONNECTING 10 (TUBING) ×3 IMPLANT
TUBING CONNECTING 10' (TUBING) ×1

## 2013-12-10 NOTE — Brief Op Note (Signed)
12/10/2013  9:24 AM  PATIENT:  Karl Walker  80 y.o. male  PRE-OPERATIVE DIAGNOSIS:  BLADDER LESION, BPH WITH BLADDER OUTLET OBSTRUCTION  POST-OPERATIVE DIAGNOSIS:  BLADDER LESION, BPH WITH BLADDER OUTLET   PROCEDURE:  Procedure(s): TRANSURETHRAL RESECTION OF THE PROSTATE (TURP) with bladder biopsy with fulgration (N/A) CYSTOSCOPY WITH RETROGRADE PYELOGRAM (Bilateral)  SURGEON:  Surgeon(s) and Role:    * [name], MD - Primary  PHYSICIAN ASSISTANT:   ASSISTANTS: none   ANESTHESIA:   general  EBL:  Total I/O In: 1000 [I.V.:1000] Out: 500 [Blood:500]  BLOOD ADMINISTERED:none  DRAINS: Urinary Catheter (Foley)   LOCAL MEDICATIONS USED:  NONE  SPECIMEN:  Source of Specimen:  prostate chips and bladder biopsy  DISPOSITION OF SPECIMEN:  PATHOLOGY  COUNTS:  YES  TOURNIQUET:  * No tourniquets in log *  DICTATION: .Other Dictation: Dictation Number [id number]  PLAN OF CARE: Admit for overnight observation  PATIENT DISPOSITION:  PACU - hemodynamically stable.   Delay start of Pharmacological VTE agent (>24hrs) due to surgical blood loss or risk of bleeding: yes

## 2013-12-10 NOTE — Progress Notes (Signed)
Patient ambulated in hallway >200 feet. Patient tolerated well. Will continue to monitor patient Karl Walker

## 2013-12-10 NOTE — Anesthesia Preprocedure Evaluation (Addendum)
Anesthesia Evaluation  Patient identified by MRN, date of birth, ID band Patient awake    Reviewed: Allergy & Precautions, H&P , NPO status , Patient's Chart, lab work & pertinent test results  Airway Mallampati: II  Neck ROM: full    Dental  (+) Teeth Intact, Dental Advisory Given   Pulmonary shortness of breath, pneumonia -, COPD COPD inhaler, Current Smoker,          Cardiovascular hypertension, Pt. on medications + CAD, + Past MI, + Cardiac Stents and + Peripheral Vascular Disease Rhythm:Regular Rate:Normal     Neuro/Psych negative neurological ROS  negative psych ROS   GI/Hepatic negative GI ROS, Neg liver ROS,   Endo/Other  negative endocrine ROS  Renal/GU negative Renal ROS     Musculoskeletal negative musculoskeletal ROS (+)   Abdominal   Peds  Hematology negative hematology ROS (+)   Anesthesia Other Findings   Reproductive/Obstetrics                          Anesthesia Physical  Anesthesia Plan  ASA: III  Anesthesia Plan: General   Post-op Pain Management:    Induction: Intravenous  Airway Management Planned: LMA  Additional Equipment:   Intra-op Plan:   Post-operative Plan: Extubation in OR  Informed Consent: I have reviewed the patients History and Physical, chart, labs and discussed the procedure including the risks, benefits and alternatives for the proposed anesthesia with the patient or authorized representative who has indicated his/her understanding and acceptance.   Dental advisory given  Plan Discussed with: CRNA and Surgeon  Anesthesia Plan Comments:         Anesthesia Quick Evaluation

## 2013-12-10 NOTE — Anesthesia Postprocedure Evaluation (Signed)
Anesthesia Post Note  Patient: Karl Walker  Procedure(s) Performed: Procedure(s) (LRB): TRANSURETHRAL RESECTION OF THE PROSTATE (TURP) with bladder biopsy with fulgration (N/A) CYSTOSCOPY WITH RETROGRADE PYELOGRAM (Bilateral)  Anesthesia type: General  Patient location: PACU  Post pain: Pain level controlled  Post assessment: Post-op Vital signs reviewed  Last Vitals: BP 126/56  Pulse 81  Temp(Src) 36.4 C (Oral)  Resp 16  Ht 5\' 11"  (1.803 m)  Wt 152 lb (68.947 kg)  BMI 21.21 kg/m2  SpO2 100%  Post vital signs: Reviewed  Level of consciousness: sedated  Complications: No apparent anesthesia complications

## 2013-12-10 NOTE — Interval H&P Note (Signed)
History and Physical Interval Note:  12/10/2013 7:19 AM  Karl Walker  has presented today for surgery, with the diagnosis of BLADDER LESION, BPH WITH BLADDER OUTLET OBSTRUCTION  The various methods of treatment have been discussed with the patient and family. After consideration of risks, benefits and other options for treatment, the patient has consented to  Procedure(s): TRANSURETHRAL RESECTION OF THE PROSTATE (TURP) with bladder biopsy with fulgration (N/A) CYSTOSCOPY WITH RETROGRADE PYELOGRAM (N/A) as a surgical intervention .  The patient's history has been reviewed, patient examined, no change in status, stable for surgery.  I have reviewed the patient's chart and labs.  Questions were answered to the patient's satisfaction.     Shanera Meske J

## 2013-12-10 NOTE — Progress Notes (Signed)
Patient ID: Karl Walker, male   DOB: 07/22/1932, 80 y.o.   MRN: [id number]  He is doing well postop.  His Hgb was stable.  He has no complaints and the urine is clearing on CBI.

## 2013-12-10 NOTE — Transfer of Care (Signed)
Immediate Anesthesia Transfer of Care Note  Patient: Karl Walker  Procedure(s) Performed: Procedure(s): TRANSURETHRAL RESECTION OF THE PROSTATE (TURP) with bladder biopsy with fulgration (N/A) CYSTOSCOPY WITH RETROGRADE PYELOGRAM (Bilateral)  Patient Location: PACU  Anesthesia Type:General  Level of Consciousness: awake, alert  and oriented  Airway & Oxygen Therapy: Patient Spontanous Breathing and Patient connected to face mask oxygen  Post-op Assessment: Report given to PACU RN and Post -op Vital signs reviewed and stable  Post vital signs: Reviewed and stable  Complications: No apparent anesthesia complications

## 2013-12-10 NOTE — Discharge Instructions (Signed)
Transurethral Resection of the Prostate Care After Refer to this sheet in the next few weeks. These instructions provide you with information on caring for yourself after your procedure. Your caregiver also may give you specific instructions. Your treatment has been planned according to current medical practices, but complications sometimes occur. Call your caregiver if you have any problems or questions after your procedure. HOME CARE INSTRUCTIONS  Recovery can take 4 6 weeks. Avoid alcohol, caffeinated drinks, and spicy foods for 2 weeks after your procedure. Drink enough fluids to keep your urine clear or pale yellow. Urinate as soon as you feel the urge to do so. Do not try to hold your urine for long periods of time. During recovery you may experience pain caused by bladder spasms, which result in a very intense urge to urinate. Take all medicines as directed by your caregiver, including medicines for pain. Try to limit the amount of pain medicines you take because it can cause constipation. If you do become constipated, do not strain to move your bowels. Straining can increase bleeding. Constipation can be minimized by increasing the amount fluids and fiber in your diet. Your caregiver also may prescribe a stool softener. Do not lift heavy objects (more than 5 lb [2.25 kg]) or perform exercises that cause you to strain for at least 1 month after your procedure. When sitting, you may want to sit in a soft chair or use a cushion. For the first 10 days after your procedure, avoid the following activities:  Running.  Strenuous work.  Long walks.  Riding in a car for extended periods.  Sex. SEEK MEDICAL CARE IF:  You have difficulty urinating.  You have blood in your urine that does not go away after you rest or increase your fluid intake.  You have swelling in your penis or scrotum. SEEK IMMEDIATE MEDICAL CARE IF:   You are suddenly unable to urinate.  You notice blood clots in your  urine.  You have chills.  You have a fever.  You have pain in your back or lower abdomen.  You have pain or swelling in your legs. MAKE SURE YOU:   Understand these instructions.  Will watch your condition.  Will get help right away if you are not doing well or get worse. Document Released: 10/08/2005 Document Revised: 07/02/2012 Document Reviewed: 11/16/2011 [facility] Patient Information 2014 [place].   Hold Aspirin for a week.

## 2013-12-11 ENCOUNTER — Observation Stay (HOSPITAL_COMMUNITY): Payer: Medicare HMO | Admitting: Certified Registered"

## 2013-12-11 ENCOUNTER — Encounter (HOSPITAL_COMMUNITY): Payer: Self-pay | Admitting: Certified Registered"

## 2013-12-11 ENCOUNTER — Encounter (HOSPITAL_COMMUNITY)
Admission: RE | Disposition: A | Discharge status: Home / Self Care | Payer: Self-pay | Source: Ambulatory Visit | Attending: Urology

## 2013-12-11 ENCOUNTER — Encounter (HOSPITAL_COMMUNITY): Payer: Medicare HMO | Admitting: Certified Registered"

## 2013-12-11 DIAGNOSIS — N3289 Other specified disorders of bladder: Secondary | ICD-10-CM | POA: Diagnosis not present

## 2013-12-11 DIAGNOSIS — D62 Acute posthemorrhagic anemia: Secondary | ICD-10-CM | POA: Diagnosis not present

## 2013-12-11 DIAGNOSIS — IMO0002 Reserved for concepts with insufficient information to code with codable children: Secondary | ICD-10-CM | POA: Diagnosis not present

## 2013-12-11 DIAGNOSIS — N401 Enlarged prostate with lower urinary tract symptoms: Secondary | ICD-10-CM | POA: Diagnosis not present

## 2013-12-11 HISTORY — PX: CYSTOSCOPY: SHX5120

## 2013-12-11 LAB — BASIC METABOLIC PANEL
BUN: 14 mg/dL (ref 6–23)
CO2: 26 meq/L (ref 19–32)
CREATININE: 1.12 mg/dL (ref 0.50–1.35)
Calcium: 8.5 mg/dL (ref 8.4–10.5)
Chloride: 100 mEq/L (ref 96–112)
GFR calc Af Amer: 70 mL/min — ABNORMAL LOW (ref >90)
GFR calc non Af Amer: 61 mL/min — ABNORMAL LOW (ref >90)
Glucose, Bld: 168 mg/dL — ABNORMAL HIGH (ref 70–99)
Potassium: 4.5 mEq/L (ref 3.7–5.3)
Sodium: 136 mEq/L — ABNORMAL LOW (ref 137–147)

## 2013-12-11 LAB — ABO/RH: ABO/RH(D): O POS

## 2013-12-11 LAB — HEMOGLOBIN AND HEMATOCRIT, BLOOD
HCT: 38 % — ABNORMAL LOW (ref 39.0–52.0)
Hemoglobin: 12.4 g/dL — ABNORMAL LOW (ref 13.0–17.0)

## 2013-12-11 SURGERY — CYSTOSCOPY
Anesthesia: General

## 2013-12-11 MED ORDER — CIPROFLOXACIN IN D5W 400 MG/200ML IV SOLN
400.0000 mg | INTRAVENOUS | Status: AC
Start: 2013-12-11 — End: 2013-12-11
  Administered 2013-12-11: 400 mg via INTRAVENOUS
  Filled 2013-12-11: qty 200

## 2013-12-11 MED ORDER — CIPROFLOXACIN IN D5W 400 MG/200ML IV SOLN
INTRAVENOUS | Status: AC
  Filled 2013-12-11: qty 200

## 2013-12-11 MED ORDER — PHENYLEPHRINE HCL 10 MG/ML IJ SOLN
INTRAMUSCULAR | Status: DC | PRN
  Administered 2013-12-11 (×4): 80 ug via INTRAVENOUS

## 2013-12-11 MED ORDER — EPHEDRINE SULFATE 50 MG/ML IJ SOLN
INTRAMUSCULAR | Status: AC
  Filled 2013-12-11: qty 1

## 2013-12-11 MED ORDER — HYDROMORPHONE HCL PF 1 MG/ML IJ SOLN: 0.2500 mg | INTRAMUSCULAR | Status: DC | PRN

## 2013-12-11 MED ORDER — SODIUM CHLORIDE 0.9 % IJ SOLN
INTRAMUSCULAR | Status: AC
  Filled 2013-12-11: qty 10

## 2013-12-11 MED ORDER — PROMETHAZINE HCL 25 MG/ML IJ SOLN: 6.2500 mg | INTRAMUSCULAR | Status: DC | PRN

## 2013-12-11 MED ORDER — INFLUENZA VAC SPLIT QUAD 0.5 ML IM SUSP
0.5000 mL | INTRAMUSCULAR | Status: AC
  Administered 2013-12-12: 0.5 mL via INTRAMUSCULAR
  Filled 2013-12-11 (×2): qty 0.5

## 2013-12-11 MED ORDER — EPHEDRINE SULFATE 50 MG/ML IJ SOLN
INTRAMUSCULAR | Status: DC | PRN
  Administered 2013-12-11 (×3): 10 mg via INTRAVENOUS

## 2013-12-11 MED ORDER — LACTATED RINGERS IV SOLN
INTRAVENOUS | Status: DC
Start: 2013-12-11 — End: 2013-12-12
  Administered 2013-12-11: 09:00:00 via INTRAVENOUS

## 2013-12-11 MED ORDER — FENTANYL CITRATE 0.05 MG/ML IJ SOLN
INTRAMUSCULAR | Status: AC
  Filled 2013-12-11: qty 2

## 2013-12-11 MED ORDER — PROPOFOL 10 MG/ML IV BOLUS
INTRAVENOUS | Status: DC | PRN
  Administered 2013-12-11: 150 mg via INTRAVENOUS

## 2013-12-11 MED ORDER — PROPOFOL 10 MG/ML IV BOLUS
INTRAVENOUS | Status: AC
  Filled 2013-12-11: qty 20

## 2013-12-11 MED ORDER — SODIUM CHLORIDE 0.9 % IR SOLN
Status: DC | PRN
  Administered 2013-12-11: 6000 mL

## 2013-12-11 MED ORDER — ONDANSETRON HCL 4 MG/2ML IJ SOLN
INTRAMUSCULAR | Status: DC | PRN
  Administered 2013-12-11: 4 mg via INTRAVENOUS

## 2013-12-11 MED ORDER — MEPERIDINE HCL 50 MG/ML IJ SOLN: 6.2500 mg | INTRAMUSCULAR | Status: DC | PRN

## 2013-12-11 MED ORDER — LIDOCAINE HCL (CARDIAC) 20 MG/ML IV SOLN
INTRAVENOUS | Status: DC | PRN
  Administered 2013-12-11: 40 mg via INTRAVENOUS

## 2013-12-11 MED ORDER — OXYCODONE HCL 5 MG PO TABS: 5.0000 mg | ORAL_TABLET | Freq: Once | ORAL | Status: DC | PRN

## 2013-12-11 MED ORDER — OXYCODONE HCL 5 MG/5ML PO SOLN
5.0000 mg | Freq: Once | ORAL | Status: DC | PRN
  Filled 2013-12-11: qty 5

## 2013-12-11 MED ORDER — LIDOCAINE HCL (CARDIAC) 20 MG/ML IV SOLN
INTRAVENOUS | Status: AC
  Filled 2013-12-11: qty 5

## 2013-12-11 MED ORDER — ONDANSETRON HCL 4 MG/2ML IJ SOLN
INTRAMUSCULAR | Status: AC
  Filled 2013-12-11: qty 2

## 2013-12-11 MED ORDER — FENTANYL CITRATE 0.05 MG/ML IJ SOLN
INTRAMUSCULAR | Status: DC | PRN
  Administered 2013-12-11 (×4): 50 ug via INTRAVENOUS

## 2013-12-11 MED ORDER — PHENYLEPHRINE 40 MCG/ML (10ML) SYRINGE FOR IV PUSH (FOR BLOOD PRESSURE SUPPORT)
PREFILLED_SYRINGE | INTRAVENOUS | Status: AC
  Filled 2013-12-11: qty 10

## 2013-12-11 SURGICAL SUPPLY — 16 items
BAG URINE DRAINAGE (UROLOGICAL SUPPLIES) ×2 IMPLANT
BAG URO CATCHER STRL LF (DRAPE) ×2 IMPLANT
CATH FOLEY 2WAY SLVR 30CC 22FR (CATHETERS) IMPLANT
CATH FOLEY 3WAY 30CC 22FR (CATHETERS) ×2 IMPLANT
DRAPE CAMERA CLOSED 9X96 (DRAPES) ×2 IMPLANT
ELECT BUTTON HF 24-28F 2 30DE (ELECTRODE) ×2 IMPLANT
ELECT LOOP MED HF 24F 12D (CUTTING LOOP) IMPLANT
ELECT LOOP MED HF 24F 12D CBL (CLIP) IMPLANT
ELECT RESECT VAPORIZE 12D CBL (ELECTRODE) ×2 IMPLANT
GLOVE BIOGEL M 8.0 STRL (GLOVE) ×6 IMPLANT
GOWN STRL REUS W/TWL XL LVL3 (GOWN DISPOSABLE) ×4 IMPLANT
HOLDER FOLEY CATH W/STRAP (MISCELLANEOUS) IMPLANT
PACK CYSTO (CUSTOM PROCEDURE TRAY) ×2 IMPLANT
SYR 30ML LL (SYRINGE) ×2 IMPLANT
SYRINGE IRR TOOMEY STRL 70CC (SYRINGE) ×2 IMPLANT
TUBING CONNECTING 10 (TUBING) ×2 IMPLANT

## 2013-12-11 NOTE — Progress Notes (Addendum)
Patient ID: Karl Walker, male   DOB: 1932/10/17, 80 y.o.   MRN: [id number] 1 Day Post-Op  Subjective: [name] had considerable bleeding through the night and had to have his foley changed to a 46fr coude hematuria catheter.  He continues to bleed with the irrigation wide open and has dropped his Hgb 4gms.   He is otherwise without complaints.  ROS: Negative except as above.  Objective: Vital signs in last 24 hours: Temp:  [97.3 F (36.3 C)-98.7 F (37.1 C)] 98.2 F (36.8 C) (02/20 0554) Pulse Rate:  [65-101] 99 (02/20 0554) Resp:  [12-25] 16 (02/20 0554) BP: (113-186)/(52-82) 113/61 mmHg (02/20 0554) SpO2:  [95 %-100 %] 95 % (02/20 0625) Weight:  [68.947 kg (152 lb)] 68.947 kg (152 lb) (02/19 1229)  Intake/Output from previous day: 02/19 0701 - 02/20 0700 In: 57846.9 [P.O.:300; I.V.:3214.6] Out: [phone] [WUXLK:44010; Blood:500] Intake/Output this shift:    General appearance: alert and no distress  Lab Results:   Recent Labs  12/10/13 1059 12/11/13 0350  HGB 16.5 12.4*  HCT 49.6 38.0*   BMET  Recent Labs  12/11/13 0350  NA 136*  K 4.5  CL 100  CO2 26  GLUCOSE 168*  BUN 14  CREATININE 1.12  CALCIUM 8.5   PT/INR No results found for this basename: LABPROT, INR,  in the last 72 hours ABG No results found for this basename: PHART, PCO2, PO2, HCO3,  in the last 72 hours  Studies/Results: No results found.  Anti-infectives: Anti-infectives   Start     Dose/Rate Route Frequency Ordered Stop   12/10/13 1230  sulfamethoxazole-trimethoprim (BACTRIM DS) 800-160 MG per tablet 1 tablet     1 tablet Oral Every 12 hours 12/10/13 1217     12/10/13 0536  ciprofloxacin (CIPRO) IVPB 400 mg     400 mg 200 mL/hr over 60 Minutes Intravenous 60 min pre-op 12/10/13 0536 12/10/13 0704   12/10/13 0000  sulfamethoxazole-trimethoprim (BACTRIM DS) 800-160 MG per tablet     1 tablet Oral 2 times daily 12/10/13 2725        Current Facility-Administered Medications   Medication Dose Route Frequency Provider Last Rate Last Dose  . acetaminophen (TYLENOL) tablet 650 mg  650 mg Oral Q4H PRN [name], MD      . albuterol (PROVENTIL) (2.5 MG/3ML) 0.083% nebulizer solution 2.5 mg  2.5 mg Nebulization Q4H PRN [name], MD   2.5 mg at 12/11/13 3664  . dextrose 5 % and 0.45 % NaCl with KCl 20 mEq/L infusion   Intravenous Continuous [name], MD 100 mL/hr at 12/11/13 0032    . diphenhydrAMINE (BENADRYL) injection 12.5 mg  12.5 mg Intravenous Q6H PRN [name], MD       Or  . diphenhydrAMINE (BENADRYL) 12.5 MG/5ML elixir 12.5 mg  12.5 mg Oral Q6H PRN [name], MD      . docusate sodium (COLACE) capsule 100 mg  100 mg Oral BID [name], MD   100 mg at 12/10/13 2119  . HYDROcodone-acetaminophen (NORCO/VICODIN) 5-325 MG per tablet 1-2 tablet  1-2 tablet Oral Q4H PRN [name], MD      . HYDROmorphone (DILAUDID) injection 0.5-1 mg  0.5-1 mg Intravenous Q2H PRN [name], MD      . hyoscyamine (LEVSIN SL) SL tablet 0.125 mg  0.125 mg Oral Q4H PRN [name], MD   0.125 mg at 12/10/13 1119  . niacin tablet 500 mg  500 mg Oral Daily [name], MD  500 mg at 12/10/13 1344  . ondansetron (ZOFRAN) injection 4 mg  4 mg Intravenous Q4H PRN [name], MD      . sodium chloride irrigation 0.9 % 3,000 mL  3,000 mL Irrigation Continuous [name], MD   3,000 mL at 12/10/13 1151  . sulfamethoxazole-trimethoprim (BACTRIM DS) 800-160 MG per tablet 1 tablet  1 tablet Oral Q12H [name], MD   1 tablet at 12/10/13 2119    Assessment: s/p Procedure(s): TRANSURETHRAL RESECTION OF THE PROSTATE (TURP) with bladder biopsy with fulgration CYSTOSCOPY WITH RETROGRADE PYELOGRAM  He has had persistent bleeding through the night and has an acute blood loss anemia.  Plan: He is going to need to return to the OR for fulguration of bleeder and possible clot evacuation.    I will post him for the procedure and contact the on call doctor since I have to go to [place] for clinic  today.      LOS: 1 day    Terrie Grajales J 12/11/2013

## 2013-12-11 NOTE — Progress Notes (Addendum)
Pt's foley catheter repeatedly obstructed by blood clots requiring hand irrigation, eventually was unable to remove clots. On call NP for [facility] notified and order received to insert a 24 fr hematuria foley catheter. After 24 fr foley was inserted noted that the catheter still became obstructed by clots numerous times, requiring hand irrigation. Hand irrigations yielded a large amount of clots, of various sizes, and was performed until no clots returned, each time. Pt c/o discomfort, like needing to have a bowel movement and became very restless in each instance the catheter become obstructed, which was relieved once hand irrigation was performed. CBI flow color varying between light pink and bright red. Will continue to monitor.   Also noted at beginning of shift pt to have expiratory and inspiratory wheezing, which pt states is not unusual for him. Pt states he normally takes a nebulizer treatment at home when this occurs. On call NP was notified and RT called to assess pt and perform nebulizer treatment. Will continue to monitor.

## 2013-12-11 NOTE — Op Note (Signed)
PATIENT:  Karl Walker  PRE-OPERATIVE DIAGNOSIS: Postoperative bleeding  POST-OPERATIVE DIAGNOSIS: Same  PROCEDURE: 1. Cystoscopy with clot evacuation. 2. Fulguration of bleeders.  SURGEON:  Claybon Jabs  INDICATION: Karl Walker is a 80 year old male who underwent bladder biopsy and TURP yesterday. Overnight he had continuous, brisk bleeding despite continuous bladder irrigation with a resultant drop in his hemoglobin of 4 g. It was felt repeat cystoscopic evaluation was indicated in order to control the bleeding. This was discussed the patient and he has elected to proceed.  ANESTHESIA:  General  EBL:  Minimal  DRAINS: 36 [place], three-way Foley catheter  LOCAL MEDICATIONS USED:  None  SPECIMEN:  None  Description of procedure: After informed consent the patient was taken to the operating room and placed on the table in a supine position. General anesthesia was then administered. Once fully anesthetized the patient was moved to the dorsal lithotomy position and the genitalia were sterilely prepped and draped in standard fashion. An official timeout was then performed.  The 26 French resectoscope sheath with visual obturator were then passed down the urethra using a 12 lens. Ureter was noted be normal. Upon entering the prostatic urethra I noted clot adherent to the inner surface of the prostatic urethra. Passing the scope on into the bladder I noted a lot of clot in the bladder. I therefore used the [place] syringe to evacuate all clot from the bladder. I then used the gyrus button and was able to manipulate the clot from the prostatic urethra in order to visualize the previously resected prostate tissue. This was accomplished partially and then I used the [place] syringe with the scope placed at the level of the veru in order to also evacuated clot from the prostatic urethra and was successful in achieving this but noted no arterial bleeders up on slow, careful and systematic  inspection of the entire inner surface of the prostatic urethra and bladder neck region. Some small areas of venous bleeding were identified and fulgurated.   I then advanced the scope into the bladder and found the area where he previously undergone his bladder biopsy on the posterior wall and noted no bleeding from this location. I then withdrew the scope back to the level of the prostatic urethra and turned off the water to try to identify any active bleeding and was not able to find any arterial bleeding. A few small venous bleeding points were cauterized. I then filled the bladder and removed the resectoscope. The catheter was then placed using a catheter guide so that it would not scrape the floor of the prostatic urethra and the balloon was then filled with 25 cc of sterile water. I then irrigated the bladder with a [place] syringe through the catheter and noted the irrigant returned completely clear. I connected the three-way catheter to closed system drainage and continuous irrigation with sterile saline and noted it continued to drain completely clear. The patient tolerated the procedure well and there were no intraoperative complications. He was then awakened and transferred to the PACU in stable and satisfactory condition.   PATIENT DISPOSITION:  PACU - hemodynamically stable.

## 2013-12-11 NOTE — Op Note (Signed)
NAMEAEMON, KOELLER NO.:  [id number]  MEDICAL RECORD NO.:  [id number]  LOCATION:  6073                         FACILITY:  [facility]  PHYSICIAN:  Marshall Cork. Jeffie Pollock, M.D.    DATE OF BIRTH:  June 05, 1932  DATE OF PROCEDURE:  12/10/2013 DATE OF DISCHARGE:                              OPERATIVE REPORT   PROCEDURE: 1. Cystoscopy with bilateral retrograde pyelograms and interpretation. 2. Transurethral resection of prostate. 3. Bladder biopsy and fulguration.  PREOPERATIVE DIAGNOSES:  Benign prostatic hypertrophy with bladder outlet obstruction and bladder wall lesion with persistent pyuria.  POSTOPERATIVE DIAGNOSES:  Benign prostatic hypertrophy with bladder outlet obstruction and bladder wall lesion with persistent pyuria.  SURGEON:  Marshall Cork. Jeffie Pollock, M.D.  ANESTHESIA:  General.  SPECIMEN:  Prostate chips and bladder biopsy.  DRAINS:  A 22-French 3-way Foley catheter.  BLOOD LOSS:  500 mL.  COMPLICATIONS:  None.  INDICATIONS:  [name] is a 80 year old white male with BPH with bladder outlet obstruction, chronic pyuria.  On office cystoscopy, he had a bladder wall lesion on the edge of a large posterior diverticulum. It was felt that needed biopsy but also that the transurethral resection of the prostate was indicated.  FINDINGS AND PROCEDURE:  He was given Cipro.  He was taken to the operating room where general anesthetic was induced.  He was placed in lithotomy position.  His perineum and genitalia were prepped with Betadine solution and he was draped in the usual sterile fashion.  He was fitted with PAS hose.  Cystoscopy was performed with a 22-French scope and 12 and 70 degree lenses.  Examination revealed a normal urethra.  The external sphincter was intact.  The prostatic urethra was approximately 5 cm in length with trilobar hyperplasia, the large middle lobe.  Examination of bladder revealed moderate trabeculation with several cellules and diverticula  on the posterior wall.  There was a very wide mouth diverticula, approximately 3 cm in diameter that had some posterior wall erythema and on the left rim, there was more erythematous mucosa with some central exudative changes most consistent with a chronic inflammatory process.  No papillary tumors or stones were identified.  An initial attempt to identify the ureteral orifices was unsuccessful due to some bleeding from the prostate from instrumentation.  I then calibrated the urethra to 30-French with Leander Rams sounds, and inserted a 28-French continuous flow resectoscope sheath.  This was fitted with an Beatrix Fetters handle with a bipolar loop and 12-degrees lens. Saline was used as the irrigant.  Fulguration at the bladder neck allowed visualization of the ureters.  The resectoscope was removed.  The cystoscope was replaced and bilateral retrograde pyelogram was performed with a 5-French open-ended catheter and Omnipaque.  The left retrograde pyelogram revealed J hooking of the distal ureter but otherwise normal ureter and intrarenal collecting system without filling defects.  The right retrograde pyelogram revealed J hooking of the distal ureter but otherwise normal ureter and intrarenal collecting system without filling defects or obstruction.  After completion of retrograde pyelogram, resectoscope was replaced and transurethral resection of prostate was performed.  Initially, the middle lobe was resected down to the capsular fibers, followed by resection  out to alongside the verumontanum.  The prostate was quite large and bloody requiring prolonged resection but after the right lobe was resected from bladder neck to apex out to the capsular fibers particularly proximally.  This was followed by the left lobe.  The chips were evacuated as needed.  Once both lateral lobes were resected, residual apical and anterior tissue was resected along with some other areas in the floor that  needed smoothing out.  Once an adequate TUR had been performed, the bladder was evacuated, free of residual chips, and hemostasis was achieved.  At this point, the resectoscope was used to take a biopsy from the posterior bladder wall lesion.  This specimen was collected and the biopsy site was fulgurated.  Muscle was included but it did not appear to be into fat.  At this point, a final inspection revealed no retained chips, intact ureteral orifices.  No active bleeding.  Intact external sphincter.  A 22-French, 3-way Foley catheter was placed with the aid of a catheter guide.  The balloon was filled with 30 mL sterile fluid and hand irrigated until return was clear.  He was then placed on continuous irrigation and straight drainage.  He was taken down from lithotomy position.  His anesthetic was reversed.  He was moved to recovery room in stable condition.  There were no complications.     Marshall Cork. Jeffie Pollock, M.D.     JJW/MEDQ  D:  12/10/2013  T:  12/11/2013  Job:  [id number]

## 2013-12-11 NOTE — Transfer of Care (Signed)
Immediate Anesthesia Transfer of Care Note  Patient: Karl Walker  Procedure(s) Performed: Procedure(s) (LRB): CYSTOSCOPY with evacuation of clots and fulguration of bleeders.  (N/A)  Patient Location: PACU  Anesthesia Type: General  Level of Consciousness: sedated, patient cooperative and responds to stimulation  Airway & Oxygen Therapy: Patient Spontanous Breathing and Patient connected to face mask oxgen  Post-op Assessment: Report given to PACU RN and Post -op Vital signs reviewed and stable  Post vital signs: Reviewed and stable  Complications: No apparent anesthesia complications

## 2013-12-11 NOTE — Anesthesia Postprocedure Evaluation (Signed)
Anesthesia Post Note  Patient: Karl Walker  Procedure(s) Performed: Procedure(s) (LRB): CYSTOSCOPY with evacuation of clots and fulguration of bleeders.  (N/A)  Anesthesia type: General  Patient location: PACU  Post pain: Pain level controlled  Post assessment: Post-op Vital signs reviewed  Last Vitals: BP 111/81  Pulse 99  Temp(Src) 36.5 C (Oral)  Resp 16  Ht 5\' 11"  (1.803 m)  Wt 152 lb (68.947 kg)  BMI 21.21 kg/m2  SpO2 99%  Post vital signs: Reviewed  Level of consciousness: sedated  Complications: No apparent anesthesia complications

## 2013-12-11 NOTE — Progress Notes (Signed)
UR completed. Patient changed to inpatient- returning to OR today

## 2013-12-11 NOTE — Progress Notes (Signed)
Postop check  Patient reports he has no bladder pain. He was having a lot of difficulty with suprapubic discomfort. It sounds like he was having clot retention. He told me that has now completely resolved since she has come back from surgery. He says he feels fine and has no pain.  His abdomen is soft and nontender.  His vital signs are stable. He is alert and awake. His Foley catheter is draining almost completely clear urine on extremely slow CBI.  Impression: Status post cystoscopy and clot evacuation with fulguration. He is markedly improved. The bleeding has stopped. I will maintain him overnight on slow CBI.  Plan: 1. Continue CBI overnight and DC in early a.m. 2. DC Foley catheter in the morning for voiding trial with anticipated discharge tomorrow morning.

## 2013-12-11 NOTE — Preoperative (Signed)
Beta Blockers   Reason not to administer Beta Blockers:Not Applicable 

## 2013-12-11 NOTE — Progress Notes (Signed)
Patient ID: Karl Walker, male   DOB: [birth year], 80 y.o.   MRN: [id number] I was contacted by Dr. Jeffie Pollock regarding Karl Walker and the events of the preceding evening. We both were in agreement that urgent cystoscopic evaluation and fulguration of any active bleeding points was indicated.  I discussed with the patient the planned procedure in detail. We went over the risks and complications and the probability of success. We also discussed anticipated postoperative course. He understands and has elected to proceed.  I will plan to perform urgent cystoscopy with clot evacuation and fulguration.

## 2013-12-11 NOTE — Anesthesia Preprocedure Evaluation (Signed)
Anesthesia Evaluation  Patient identified by MRN, date of birth, ID band Patient awake    Reviewed: Allergy & Precautions, H&P , NPO status , Patient's Chart, lab work & pertinent test results  Airway Mallampati: II  Neck ROM: full    Dental  (+) Teeth Intact, Dental Advisory Given   Pulmonary shortness of breath, pneumonia -, COPD COPD inhaler, Current Smoker,          Cardiovascular hypertension, Pt. on medications + CAD, + Past MI, + Cardiac Stents and + Peripheral Vascular Disease Rhythm:Regular Rate:Normal     Neuro/Psych negative neurological ROS  negative psych ROS   GI/Hepatic negative GI ROS, Neg liver ROS,   Endo/Other  negative endocrine ROS  Renal/GU negative Renal ROS     Musculoskeletal negative musculoskeletal ROS (+)   Abdominal   Peds  Hematology negative hematology ROS (+)   Anesthesia Other Findings   Reproductive/Obstetrics                          Anesthesia Physical  Anesthesia Plan  ASA: III  Anesthesia Plan: General   Post-op Pain Management:    Induction: Intravenous  Airway Management Planned: LMA  Additional Equipment:   Intra-op Plan:   Post-operative Plan: Extubation in OR  Informed Consent: I have reviewed the patients History and Physical, chart, labs and discussed the procedure including the risks, benefits and alternatives for the proposed anesthesia with the patient or authorized representative who has indicated his/her understanding and acceptance.   Dental advisory given  Plan Discussed with: CRNA and Surgeon  Anesthesia Plan Comments:         Anesthesia Quick Evaluation  

## 2013-12-11 NOTE — Progress Notes (Signed)
Pt c/o of terrible pressure and catheter noted to be not draining. Hand irrigated with normal saline and many clots returned. When hooked back to drainage bag, 825cc returned. Pt reported relief. Callie Fielding RN

## 2013-12-12 NOTE — Progress Notes (Signed)
Pt post foley removal now voided 100cc bloody urine with no clots noted. MD on floor and made aware of Pt's voiding.

## 2013-12-12 NOTE — Discharge Summary (Signed)
Physician Discharge Summary  Patient ID: Karl Walker MRN: [id number] DOB/AGE: 78-16-1933 78 y.o.  Admit date: 12/10/2013 Discharge date: 12/12/2013  Admission Diagnoses:  Discharge Diagnoses:  Principal Problem:   Benign prostatic hyperplasia with urinary obstruction Active Problems:   Bladder diverticulum   BPH (benign prostatic hypertrophy) with urinary obstruction   Discharged Condition: good  Hospital Course: He was admitted for elective transurethral resection of his prostate and bladder biopsies as well as retrograde pyelograms. No abnormality of his upper tracts were noted on retrograde pyelography. He underwent bladder biopsy and transurethral resection of his prostate. The night following his surgery he had significant, persistent gross hematuria despite brisk CBI and his hemoglobin dropped 4 g. Because of continued bleeding he was taken back to the operating room and underwent cystoscopy with evacuation of clots. There was no bleeding from the biopsy site on the posterior wall of the bladder. There was also no evidence of arterial bleeding from the prostatic fossa which was noted to have been well resected. Some small areas of venous oozing were identified and fulgurated. Postoperatively his urine remained nearly clear and his catheter was removed the following morning. He began voiding spontaneously. There was still some slight blood-tinged urine although no clots. He was having no discomfort and therefore was felt ready for discharge.   Discharge Exam: Blood pressure 121/62, pulse 92, temperature 98.1 F (36.7 C), temperature source Oral, resp. rate 20, height 5\' 11"  (1.803 m), weight 68.947 kg (152 lb), SpO2 99.00%.  He is noted to be alert and oriented and in no distress  His abdomen is soft and nontender without suprapubic mass.   Disposition: 01-Home or Self Care  Discharge Orders   Future Appointments Provider Department Dept Phone   11/01/2014 9:30 AM [facility]  [place] [phone]   Eat a light meal the night before the exam Nothing to eat or drink for at least 8 hours before the exam No gum chewing, or smoking the morning of the exam Please take your morning medications with small sips of water, especially blood pressure medication *Very Important* Please wear 2 piece clothing.   11/01/2014 10:00 AM Sharmon Leyden Nickel, NP [facility] [phone]   Future Orders Complete By Expires   Discharge patient  As directed        Medication List         albuterol (2.5 MG/3ML) 0.083% nebulizer solution  Commonly known as:  PROVENTIL  Take 2.5 mg by nebulization daily as needed for wheezing or shortness of breath. And/or Congestion     aspirin EC 81 MG tablet  Take 81 mg by mouth daily.     cholecalciferol 1000 UNITS tablet  Commonly known as:  VITAMIN D  Take 1,000 Units by mouth daily.     docusate sodium 100 MG capsule  Commonly known as:  COLACE  Take 1 capsule (100 mg total) by mouth 2 (two) times daily.     finasteride 5 MG tablet  Commonly known as:  PROSCAR  Take 5 mg by mouth daily.     losartan-hydrochlorothiazide 50-12.5 MG per tablet  Commonly known as:  HYZAAR  Take 1 tablet by mouth every morning.     niacin 500 MG tablet  Take 500 mg by mouth daily.     sulfamethoxazole-trimethoprim 800-160 MG per tablet  Commonly known as:  BACTRIM DS  Take 1 tablet by mouth 2 (two) times daily.     traMADol 50  MG tablet  Commonly known as:  ULTRAM  Take 1 tablet (50 mg total) by mouth every 6 (six) hours as needed.     vitamin C 500 MG tablet  Commonly known as:  ASCORBIC ACID  Take 500 mg by mouth daily.           Follow-up Information   Follow up with Malka So, MD On 12/18/2013. (3pm in the [place] office)    Specialty:  Urology   Contact information:   [facility]  [place] [address] [phone]        Signed: Claybon Jabs 12/12/2013, 8:10 AM

## 2013-12-14 ENCOUNTER — Encounter (HOSPITAL_COMMUNITY): Payer: Self-pay | Admitting: Urology

## 2013-12-14 LAB — TYPE AND SCREEN
ABO/RH(D): O POS
ANTIBODY SCREEN: NEGATIVE
UNIT DIVISION: 0
Unit division: 0

## 2013-12-18 ENCOUNTER — Ambulatory Visit (INDEPENDENT_AMBULATORY_CARE_PROVIDER_SITE_OTHER): Payer: Commercial Managed Care - HMO | Admitting: Urology

## 2013-12-18 DIAGNOSIS — N401 Enlarged prostate with lower urinary tract symptoms: Secondary | ICD-10-CM

## 2013-12-18 DIAGNOSIS — N3020 Other chronic cystitis without hematuria: Secondary | ICD-10-CM

## 2014-03-19 ENCOUNTER — Ambulatory Visit (INDEPENDENT_AMBULATORY_CARE_PROVIDER_SITE_OTHER): Payer: Medicare HMO | Admitting: Urology

## 2014-03-19 DIAGNOSIS — N138 Other obstructive and reflux uropathy: Secondary | ICD-10-CM

## 2014-03-19 DIAGNOSIS — N3020 Other chronic cystitis without hematuria: Secondary | ICD-10-CM

## 2014-03-19 DIAGNOSIS — N403 Nodular prostate with lower urinary tract symptoms: Secondary | ICD-10-CM

## 2014-06-02 ENCOUNTER — Ambulatory Visit (INDEPENDENT_AMBULATORY_CARE_PROVIDER_SITE_OTHER): Payer: Medicare HMO | Admitting: Cardiovascular Disease

## 2014-06-02 ENCOUNTER — Encounter: Payer: Self-pay | Admitting: Cardiovascular Disease

## 2014-06-02 VITALS — BP 139/78 | HR 86 | Resp 22 | Ht 71.0 in | Wt 155.5 lb

## 2014-06-02 DIAGNOSIS — I2510 Atherosclerotic heart disease of native coronary artery without angina pectoris: Secondary | ICD-10-CM

## 2014-06-02 DIAGNOSIS — Z79899 Other long term (current) drug therapy: Secondary | ICD-10-CM

## 2014-06-02 DIAGNOSIS — E782 Mixed hyperlipidemia: Secondary | ICD-10-CM

## 2014-06-02 NOTE — Patient Instructions (Signed)
Your physician recommends that you return for lab work in: FASTING at [facility] at your convenience.  Dr. Croitoru recommends that you schedule a follow-up appointment in: One year.    

## 2014-06-04 ENCOUNTER — Encounter: Payer: Self-pay | Admitting: Cardiovascular Disease

## 2014-06-04 LAB — COMPREHENSIVE METABOLIC PANEL
ALT: 9 U/L (ref 0–53)
AST: 13 U/L (ref 0–37)
Albumin: 3.8 g/dL (ref 3.5–5.2)
Alkaline Phosphatase: 58 U/L (ref 39–117)
BUN: 14 mg/dL (ref 6–23)
CO2: 28 mEq/L (ref 19–32)
CREATININE: 1.01 mg/dL (ref 0.50–1.35)
Calcium: 9.7 mg/dL (ref 8.4–10.5)
Chloride: 101 mEq/L (ref 96–112)
Glucose, Bld: 107 mg/dL — ABNORMAL HIGH (ref 70–99)
Potassium: 4.2 mEq/L (ref 3.5–5.3)
Sodium: 136 mEq/L (ref 135–145)
Total Bilirubin: 0.7 mg/dL (ref 0.2–1.2)
Total Protein: 6.5 g/dL (ref 6.0–8.3)

## 2014-06-04 LAB — LIPID PANEL
CHOL/HDL RATIO: 3 ratio
CHOLESTEROL: 172 mg/dL (ref 0–200)
HDL: 58 mg/dL (ref >39)
LDL Cholesterol: 97 mg/dL (ref 0–99)
Triglycerides: 86 mg/dL (ref <150)
VLDL: 17 mg/dL (ref 0–40)

## 2014-06-04 NOTE — Progress Notes (Signed)
Patient ID: Karl Walker, male   DOB: 22-Oct-1932, 80 y.o.   MRN: [id number]     Reason for office visit CAD, ischemic cardiomyopathy, hyperlipidemia, PVD (status post AAA stent graft exclusion), ongoing tobacco abuse  Karl Walker has had an uneventful year. He has not been troubled by angina pectoris either at rest or with exertion. Just over the last 3 days has complained of some coughing and wheezing and his wife has also had upper respiratory infection. The symptoms seem to be improving spontaneously. He has not had any abdominal pain or gastrointestinal bleeding. He had prostate surgery and a bladder biopsy in February and did not have bleeding complications with either procedure. His urination is much brisker now. Unfortunately he continues to smoke, as does his wife.  In 2008 she had a non-ST segment elevation myocardial infarction and received a drug-eluting stent to the right coronary artery (3.5 x 18 mmCypher). He has mild residual ischemic cardiomyopathy with an ejection fraction of 46%. A nuclear stress test in 2013 did not show evidence of ischemia. He has never had clinical heart failure. Also in 2013 he had resection of an endotracheal mass effect, a benign lipomatous tumor. He has a history of skin melanoma.  He is not on a statin. This was stopped roughly a year ago because of complaints of weakness and has not been restarted. His blood pressures usually in the 130s over 70s. He does not have diabetes mellitus.  Allergies  Allergen Reactions  . Iodine     Per pt, issue with shrimp x 20 years ago, ok with CT contrast since    Current Outpatient Prescriptions  Medication Sig Dispense Refill  . albuterol (PROVENTIL) (2.5 MG/3ML) 0.083% nebulizer solution Take 2.5 mg by nebulization daily as needed for wheezing or shortness of breath. And/or Congestion      . aspirin 325 MG tablet Take 325 mg by mouth daily.      . cholecalciferol (VITAMIN D) 1000 UNITS tablet Take 1,000 Units by  mouth daily.      . ferrous sulfate 325 (65 FE) MG EC tablet Take 325 mg by mouth daily with breakfast.      . finasteride (PROSCAR) 5 MG tablet Take 5 mg by mouth daily.      Marland Kitchen losartan-hydrochlorothiazide (HYZAAR) 50-12.5 MG per tablet Take 1 tablet by mouth every morning.       . niacin 500 MG tablet Take 500 mg by mouth daily.        No current facility-administered medications for this visit.    Past Medical History  Diagnosis Date  . Urinary frequency   . Bronchitis   . Hypercholesterolemia     takes Niacin nightly  . CAD (coronary artery disease) stent in 2007  . Tracheal mass     s/p endoscopic resection  . Hypertension     takes Hyzaar daily  . Chest congestion     occasionally per pt and wife  . Shortness of breath     with exertion  . Pneumonia     last time 52yrs ago  . Bruises easily   . Dry skin   . Hx of colonic polyps   . Enlarged prostate     takes Proscar  . Abdominal aneurysm     scheduled procedure to take care of this 07/04/12 with Dr.Brabham  . Cancer     basal cell ca on face  . MI (myocardial infarction) 1996 and 2006   . COPD (chronic obstructive  pulmonary disease)     PT IS A SMOKER  . Benign prostatic hyperplasia with urinary obstruction 12/09/2013  . Bladder diverticulum     Past Surgical History  Procedure Laterality Date  . Skin surgery    . Hernia repair      right inguinal  . Coronary angioplasty with stent placement  2006  . Colonoscopy    . Esophagogastroduodenoscopy  Oct 2011    RMR: normal esophagus, J-shaped stomach, mottled gastric mucosa, bulbar erosions: path minimal chronic gastritis  . Video bronchoscopy  06/25/2012    Procedure: VIDEO BRONCHOSCOPY;  Surgeon: Melrose Nakayama, MD;  Location: [facility];  Service: Thoracic;  Laterality: N/A;  Using Snare with biopsy  . Tumor removal  2013    esophagus  . Abdominal aortic aneurysm repair  07-04-12    [facility]  . Colonoscopy  Oct 2011    RMR: multiple colon polyps, left-sided  diverticula, adenomatous, needs surveillance Oct 2014  . US echocardiography  05/24/2009    mild AI,MR  . Nm myocar perf wall motion  06/18/2012    No ischemia  . Coronary angioplasty with stent placement  12/17/2006    Stent to the RCA  . Transurethral resection of prostate N/A 12/10/2013    Procedure: TRANSURETHRAL RESECTION OF THE PROSTATE (TURP) with bladder biopsy with fulgration;  Surgeon: [name], MD;  Location: [facility];  Service: Urology;  Laterality: N/A;  . Cystoscopy w/ retrogrades Bilateral 12/10/2013    Procedure: CYSTOSCOPY WITH RETROGRADE PYELOGRAM;  Surgeon: [name], MD;  Location: [facility];  Service: Urology;  Laterality: Bilateral;  . Cystoscopy N/A 12/11/2013    Procedure: CYSTOSCOPY with evacuation of clots and fulguration of bleeders. ;  Surgeon: Claybon Jabs, MD;  Location: [facility];  Service: Urology;  Laterality: N/A;    Family History  Problem Relation Age of Onset  . Stroke Brother     History   Social History  . Marital Status: Married    Spouse Name: N/A    Number of Children: N/A  . Years of Education: N/A   Occupational History  . Not on file.   Social History Main Topics  . Smoking status: Current Every Day Smoker -- 0.25 packs/day for 55 years    Types: Cigarettes  . Smokeless tobacco: Never Used  . Alcohol Use: No  . Drug Use: No  . Sexual Activity: Yes    Birth Control/ Protection: None   Other Topics Concern  . Not on file   Social History Narrative  . No narrative on file    Review of systems: The patient specifically denies any chest pain at rest or with exertion, dyspnea at rest or with exertion, orthopnea, paroxysmal nocturnal dyspnea, syncope, palpitations, focal neurological deficits, intermittent claudication, lower extremity edema, unexplained weight gain. He has had increased wheezing and cough over the last 3 days, but no fever, chills or hemoptysis..  The patient also denies abdominal pain, nausea, vomiting, dysphagia,  diarrhea, constipation, polyuria, polydipsia, dysuria, hematuria, frequency, urgency, abnormal bleeding or bruising, unexpected weight changes, mood swings, change in skin or hair texture, change in voice quality, auditory or visual problems, allergic reactions or rashes, new musculoskeletal complaints other than usual "aches and pains".   PHYSICAL EXAM BP 139/78  Pulse 86  Resp 22  Ht 5\' 11"  (1.803 m)  Wt 155 lb 8 oz (70.534 kg)  BMI 21.70 kg/m2 General: Alert, oriented x3, no distress  Head: no evidence of trauma, PERRL, EOMI, no exophtalmos or  lid lag, no myxedema, no xanthelasma; normal ears, nose and oropharynx  Neck: normal jugular venous pulsations and no hepatojugular reflux; brisk carotid pulses without delay and no carotid bruits  Chest: Generally diminished breath sounds, bilateral large airway rhonchi and a couple of wheezes, improved considerably after coughing, no signs of consolidation by percussion or palpation, normal fremitus, symmetrical and full respiratory excursions  Cardiovascular: normal position and quality of the apical impulse, regular rhythm, distant first and widely split second heart sounds, no murmurs, rubs or gallops  Abdomen: no tenderness or distention, no masses by palpation, no abnormal pulsatility or arterial bruits, normal bowel sounds, no hepatosplenomegaly  Extremities: no clubbing, cyanosis or edema; 2+ radial, ulnar and brachial pulses bilaterally; 2+ right femoral, posterior tibial and dorsalis pedis pulses; 2+ left femoral, posterior tibial and dorsalis pedis pulses; no subclavian or femoral bruits  Neurological: grossly nonfocal   EKG: Normal sinus rhythm, right bundle branch block, right ventricular hypertrophy, northwest axis (pulmonary disease pattern)  Lipid Panel     Component Value Date/Time   CHOL 168 05/29/2013 0730   TRIG 106 05/29/2013 0730   HDL 53 05/29/2013 0730   CHOLHDL 3.2 05/29/2013 0730   VLDL 21 05/29/2013 0730   LDLCALC 94 05/29/2013  0730    BMET    Component Value Date/Time   NA 136* 12/11/2013 0350   K 4.5 12/11/2013 0350   CL 100 12/11/2013 0350   CO2 26 12/11/2013 0350   GLUCOSE 168* 12/11/2013 0350   BUN 14 12/11/2013 0350   CREATININE 1.12 12/11/2013 0350   CREATININE 1.02 05/29/2013 0730   CALCIUM 8.5 12/11/2013 0350   GFRNONAA 61* 12/11/2013 0350   GFRAA 70* 12/11/2013 0350     ASSESSMENT AND PLAN  Coronary artery disease  It has been 7 years since the placement of a stent to the right coronary artery and he has not had any new coronary event since that time. We discussed the fact that the most important interventions to keep coronary disease at [place] are lowering his cholesterol with statins as well as complete avoidance of smoking. At the time of his cardiac catheterization in 2008 he was noted to have minor atherosclerosis in the diagonal branch the only significant lesion was a 75% stenosis in the dominant right coronary artery that was treated with a 3.5 x 18 mm drug-eluting Cypher stent.   Hyperlipidemia  I am concerned that he is no longer taking a statin. He has significant peripheral arterial disease and coronary disease. We'll repeat her lipid profile. We will probably need to choose an agent other than simvastatin. He may do well with a hydrophilic statin such as pravastatin or Crestor. We discussed the fact that the Niaspan has not been shown to have significant clinical benefits and recent clinical trials have been particularly discouraging.  Abdominal aneurysm without mention of rupture  Status post stent graft repair in 2013, Dr. Annamarie Major   HTN (hypertension)  Good control. Beta blockers would be a preferred agent in view of his history of abdominal aortic aneurysm, but he clearly has reactive airway disease. Angiotensin receptor blockers are a good second choice.   Tobacco abuse  Discussed in detail the need to quit smoking long-term. He will not be successful unless his wife also quits  smoking.  Upper respiratory infection This is most likely viral. It seems to be improving spontaneously. He'll call me or his primary care physician back if his symptoms do not resolve in the next few days.  Patient Instructions  Your physician recommends that you return for lab work in: FASTING at [facility] at [facility].  Dr. Sallyanne Kuster recommends that you schedule a follow-up appointment in: One year.       Orders Placed This Encounter  Procedures  . Comprehensive metabolic panel  . Lipid panel  . EKG 12-Lead   Meds ordered this encounter  Medications  . ferrous sulfate 325 (65 FE) MG EC tablet    Sig: Take 325 mg by mouth daily with breakfast.  . aspirin 325 MG tablet    Sig: Take 325 mg by mouth daily.    Holli Humbles, MD, [place] [phone] office [phone] pager

## 2014-07-15 ENCOUNTER — Other Ambulatory Visit: Payer: Self-pay | Admitting: *Deleted

## 2014-07-15 DIAGNOSIS — R222 Localized swelling, mass and lump, trunk: Secondary | ICD-10-CM

## 2014-08-17 ENCOUNTER — Ambulatory Visit
Admission: RE | Admit: 2014-08-17 | Discharge: 2014-08-17 | Disposition: A | Discharge status: Home / Self Care | Payer: Commercial Managed Care - HMO | Source: Ambulatory Visit | Attending: Thoracic Surgery (Cardiothoracic Vascular Surgery) | Admitting: Thoracic Surgery (Cardiothoracic Vascular Surgery)

## 2014-08-17 ENCOUNTER — Ambulatory Visit (INDEPENDENT_AMBULATORY_CARE_PROVIDER_SITE_OTHER): Payer: Medicare HMO | Admitting: Thoracic Surgery (Cardiothoracic Vascular Surgery)

## 2014-08-17 VITALS — BP 153/85 | HR 83 | Resp 16 | Ht 71.0 in | Wt 155.0 lb

## 2014-08-17 DIAGNOSIS — J439 Emphysema, unspecified: Secondary | ICD-10-CM | POA: Insufficient documentation

## 2014-08-17 DIAGNOSIS — J398 Other specified diseases of upper respiratory tract: Secondary | ICD-10-CM

## 2014-08-17 DIAGNOSIS — J432 Centrilobular emphysema: Secondary | ICD-10-CM

## 2014-08-17 DIAGNOSIS — R222 Localized swelling, mass and lump, trunk: Secondary | ICD-10-CM

## 2014-08-17 DIAGNOSIS — D179 Benign lipomatous neoplasm, unspecified: Secondary | ICD-10-CM

## 2014-08-17 NOTE — Progress Notes (Signed)
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 I last saw him in the office a year ago which time he was doing well with no evidence of recurrence.  He says that he's been doing pretty well. He continues to smoke cigarettes. He says he is only smoking 2-3 cigarettes per day. He says he can quit if he would just make his mind up. He has had some coughing and wheezing lately which he blames on pollen.  Past Medical History  Diagnosis Date  . Urinary frequency   . Bronchitis   . Hypercholesterolemia     takes Niacin nightly  . CAD (coronary artery disease) stent in 2007  . Tracheal mass     s/p endoscopic resection  . Hypertension     takes Hyzaar daily  . Chest congestion     occasionally per pt and wife  . Shortness of breath     with exertion  . Pneumonia     last time 108yrs ago  . Bruises easily   . Dry skin   . Hx of colonic polyps   . Enlarged prostate     takes Proscar  . Abdominal aneurysm     scheduled procedure to take care of this 07/04/12 with Dr.Brabham  . Cancer     basal cell ca on face  . MI (myocardial infarction) 1996 and 2006   . COPD (chronic obstructive pulmonary disease)     PT IS A SMOKER  . Benign prostatic hyperplasia with urinary obstruction 12/09/2013  . Bladder diverticulum       Current Outpatient Prescriptions  Medication Sig Dispense Refill  . albuterol (PROVENTIL) (2.5 MG/3ML) 0.083% nebulizer solution Take 2.5 mg by nebulization daily as needed for wheezing or shortness of breath. And/or Congestion      . aspirin 325 MG tablet Take 325 mg by mouth daily.      . cholecalciferol (VITAMIN D) 1000 UNITS tablet Take 1,000 Units by mouth daily.      . ferrous sulfate 325 (65 FE) MG EC tablet Take 325 mg by mouth daily with breakfast.      . finasteride (PROSCAR) 5 MG tablet Take 5 mg by mouth daily.      Marland Kitchen  losartan-hydrochlorothiazide (HYZAAR) 50-12.5 MG per tablet Take 1 tablet by mouth every morning.       . niacin 500 MG tablet Take 500 mg by mouth daily.        No current facility-administered medications for this visit.    Physical Exam BP 153/85  Pulse 83  Resp 16  Ht 5\' 11"  (1.803 m)  Wt 155 lb (70.308 kg)  BMI 21.63 kg/m2  SpO34 49% 80 year old man in no acute distress No cervical or subclavicular adenopathy Cardiac regular rate and rhythm normal S1 and S2 Lungs with faint wheezing bilaterally, no stridor  Diagnostic Tests: CT CHEST WITHOUT CONTRAST  TECHNIQUE:  Multidetector CT imaging of the chest was performed following the  standard protocol without IV contrast.  COMPARISON: 07/21/2013, 05/30/2012 and 01/01/2006  FINDINGS:  Mediastinum/Hilar Regions: No masses or pathologically enlarged  lymph nodes identified.  Other Thoracic Lymphadenopathy: None.  Lungs: Mild emphysema again noted. Mild scarring in the left lung  apex and both lung bases remain stable in appearance. No suspicious  pulmonary nodules or masses are identified. No evidence of  endobronchial obstruction.  Pleura: No evidence of  effusion or mass.  Vascular/Cardiac: No acute findings identified.  Other: Benign left adrenal adenoma remains stable. Abdominal aortic  stent graft again noted.  Musculoskeletal: No suspicious bone lesions identified. Old lumbar  vertebral body compression fracture again noted.  IMPRESSION:  No active disease or other acute findings within the thorax.  Mild emphysema.  Electronically Signed  By: Earle Gell M.D.  On: 08/17/2014 12:52   Impression: 80 year old gentleman who is now 2 years post bronchoscopic resection of a tracheal mass. This was a benign lipomatous lesion. He has no evidence of recurrence. I recommended that we repeat his CT scan in 1 year.  The bigger issue today is his smoking. I counseled him on smoking cessation. This was approximately 10 minutes. He  seems reluctant to quit smoking.  Plan: Return in 1 year with CT of chest

## 2014-10-29 ENCOUNTER — Encounter: Payer: Self-pay | Admitting: Family

## 2014-10-29 DIAGNOSIS — Z6822 Body mass index (BMI) 22.0-22.9, adult: Secondary | ICD-10-CM | POA: Diagnosis not present

## 2014-10-29 DIAGNOSIS — I779 Disorder of arteries and arterioles, unspecified: Secondary | ICD-10-CM | POA: Diagnosis not present

## 2014-10-29 DIAGNOSIS — I10 Essential (primary) hypertension: Secondary | ICD-10-CM | POA: Diagnosis not present

## 2014-11-01 ENCOUNTER — Ambulatory Visit (HOSPITAL_COMMUNITY)
Admission: RE | Admit: 2014-11-01 | Discharge: 2014-11-01 | Disposition: A | Discharge status: Home / Self Care | Payer: Commercial Managed Care - HMO | Source: Ambulatory Visit | Attending: Family | Admitting: Family

## 2014-11-01 ENCOUNTER — Ambulatory Visit (INDEPENDENT_AMBULATORY_CARE_PROVIDER_SITE_OTHER): Payer: Commercial Managed Care - HMO | Admitting: Family

## 2014-11-01 ENCOUNTER — Encounter: Payer: Self-pay | Admitting: Family

## 2014-11-01 VITALS — BP 151/85 | HR 73 | Resp 16 | Ht 71.0 in | Wt 158.0 lb

## 2014-11-01 DIAGNOSIS — Z95828 Presence of other vascular implants and grafts: Secondary | ICD-10-CM | POA: Diagnosis not present

## 2014-11-01 DIAGNOSIS — Z720 Tobacco use: Secondary | ICD-10-CM | POA: Diagnosis not present

## 2014-11-01 DIAGNOSIS — I714 Abdominal aortic aneurysm, without rupture, unspecified: Secondary | ICD-10-CM

## 2014-11-01 DIAGNOSIS — Z48812 Encounter for surgical aftercare following surgery on the circulatory system: Secondary | ICD-10-CM | POA: Insufficient documentation

## 2014-11-01 DIAGNOSIS — F17200 Nicotine dependence, unspecified, uncomplicated: Secondary | ICD-10-CM

## 2014-11-01 NOTE — Progress Notes (Signed)
[facility] [place]  Established [facility]  History of Present Illness  Karl Walker is a 81 y.o. (1932/02/15) male patient of Dr. 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 His initial CT scan showed the graft to be in good position without evidence of endoleak.  At a previous visit Dr. Trula Slade reviewed his carotid duplex from pre-CABG which showed no significant carotid disease.  Is regularly physically active, but states his cardiologist told him to not engage in strenuous activity. Had one MI in 2006, one cardiac stent.  He presents today for routine surveillance of the EVAR.  The patient has not had back or abdominal pain.Pt denies any history of stroke or TIA.  He had melanoma removed left shoulder in 2015, several basal cell carcinomas removed on face. He had prostate and bladder surgery February 2015, apparently for BPH. He states he voids normally.  Pt Diabetic: No Pt smoker: smoker (3-4 cigarettes/day, started at age 79 or 89, quit several times)  Past Medical History  Diagnosis Date  . Urinary frequency   . Bronchitis   . Hypercholesterolemia     takes Niacin nightly  . CAD (coronary artery disease) stent in 2007  . Tracheal mass     s/p endoscopic resection  . Hypertension     takes Hyzaar daily  . Chest congestion     occasionally per pt and wife  . Shortness of breath     with exertion  . Pneumonia     last time 57yrs ago  . Bruises easily   . Dry skin   . Hx of colonic polyps   . Enlarged prostate     takes Proscar  . Abdominal aneurysm     scheduled procedure to take care of this 07/04/12 with Dr.Brabham  . Cancer     basal cell ca on face  . MI (myocardial infarction) 1996 and 2006   . COPD (chronic obstructive pulmonary disease)     PT IS A SMOKER  . Benign prostatic hyperplasia with urinary obstruction 12/09/2013  . Bladder diverticulum    Past Surgical History  Procedure Laterality Date  . Skin  surgery    . Hernia repair      right inguinal  . Coronary angioplasty with stent placement  2006  . Colonoscopy    . Esophagogastroduodenoscopy  Oct 2011    RMR: normal esophagus, J-shaped stomach, mottled gastric mucosa, bulbar erosions: path minimal chronic gastritis  . Video bronchoscopy  06/25/2012    Procedure: VIDEO BRONCHOSCOPY;  Surgeon: Melrose Nakayama, MD;  Location: [facility];  Service: Thoracic;  Laterality: N/A;  Using Snare with biopsy  . Tumor removal  2013    esophagus  . Abdominal aortic aneurysm repair  07-04-12    EVAR  . Colonoscopy  Oct 2011    RMR: multiple colon polyps, left-sided diverticula, adenomatous, needs surveillance Oct 2014  . US echocardiography  05/24/2009    mild AI,MR  . Nm myocar perf wall motion  06/18/2012    No ischemia  . Coronary angioplasty with stent placement  12/17/2006    Stent to the RCA  . Transurethral resection of prostate N/A 12/10/2013    Procedure: TRANSURETHRAL RESECTION OF THE PROSTATE (TURP) with bladder biopsy with fulgration;  Surgeon: [name], MD;  Location: [facility];  Service: Urology;  Laterality: N/A;  . Cystoscopy w/ retrogrades Bilateral 12/10/2013    Procedure: CYSTOSCOPY WITH RETROGRADE PYELOGRAM;  Surgeon: [name], MD;  Location:  [facility];  Service: Urology;  Laterality: Bilateral;  . Cystoscopy N/A 12/11/2013    Procedure: CYSTOSCOPY with evacuation of clots and fulguration of bleeders. ;  Surgeon: Claybon Jabs, MD;  Location: [facility];  Service: Urology;  Laterality: N/A;   Social History History  Substance Use Topics  . Smoking status: Current Every Day Smoker -- 0.25 packs/day for 55 years    Types: Cigarettes  . Smokeless tobacco: Never Used  . Alcohol Use: No   Family History Family History  Problem Relation Age of Onset  . Stroke Brother    Current Outpatient Prescriptions on File Prior to Visit  Medication Sig Dispense Refill  . albuterol (PROVENTIL) (2.5 MG/3ML) 0.083% nebulizer solution Take 2.5 mg by  nebulization daily as needed for wheezing or shortness of breath. And/or Congestion    . aspirin 325 MG tablet Take 325 mg by mouth daily.    . cholecalciferol (VITAMIN D) 1000 UNITS tablet Take 1,000 Units by mouth daily.    . ferrous sulfate 325 (65 FE) MG EC tablet Take 325 mg by mouth daily with breakfast.    . finasteride (PROSCAR) 5 MG tablet Take 5 mg by mouth daily.    Marland Kitchen losartan-hydrochlorothiazide (HYZAAR) 50-12.5 MG per tablet Take 1 tablet by mouth every morning.     . niacin 500 MG tablet Take 500 mg by mouth daily.      No current facility-administered medications on file prior to visit.   Allergies  Allergen Reactions  . Iodine Other (See Comments)    Per pt, issue with shrimp x 20 years ago, ok with CT contrast since     ROS: See HPI for pertinent positives and negatives.  Physical Examination  Filed Vitals:   11/01/14 1045  BP: 151/85  Pulse: 73  Resp: 16  Height: 5\' 11"  (1.803 m)  Weight: 158 lb (71.668 kg)  SpO2: 97%   Body mass index is 22.05 kg/(m^2).  General: A&O x 3, WD.  Pulmonary: Sym exp, decreased air movement in all fields, CTAB, no rales, rhonchi, no wheezing.  Cardiac: RRR, Nl S1, S2, no murmur detected.  Vascular: Vessel Right Left  Radial 2+Palpable 2+Palpable  Carotid without bruit without bruit  Aorta Not palpable N/A  Femoral Palpable Palpable  Popliteal Not palpable Not palpable  PT Palpable Palpable  DP Palpable Palpable   Gastrointestinal: soft, NTND, -G/R, - HSM, - palpable masses, - CVAT B.  Musculoskeletal: M/S 5/5 throughout, Extremities without ischemic changes.  Neurologic: Pain and light touch intact in extremities, Motor exam as listed above, CN 2-12 intact except for hard of hearing.     Non-Invasive Vascular Imaging  EVAR Duplex (Date: 11/01/2014) ABDOMINAL AORTA DUPLEX EVALUATION - POST ENDOVASCULAR REPAIR    INDICATION: Follow up endovascular abdominal aorta repair    PREVIOUS  INTERVENTION(S): Endovascular aortic repair performed 07/04/2012    DUPLEX EXAM:      DIAMETER AP (cm) DIAMETER TRANSVERSE (cm) VELOCITIES (cm/sec)  Aorta 4.4 4.3 81  Right Common Iliac 1.3 1.2 35  Left Common Iliac 1.4 1.2 43    Comparison Study       Date DIAMETER AP (cm) DIAMETER TRANSVERSE (cm)  10/26/13 4.5 4.5     ADDITIONAL FINDINGS:     IMPRESSION: 1. Patent endovascular abdominal aortic repair with a 4.4 x 4.3 cm sac size    Compared to the previous exam:  Reduction in size      Medical Decision Making  Oaklee Sunga Jelinek is a 81  y.o. male who presents s/p EVAR (Date: 07/04/12).  Pt is asymptomatic with decrease in sac size. The patient was counseled re smoking cessation and given several free resources re smoking cessation. Face to face time with patient was 20 minutes. Over 50% of this time was spent on counseling and coordination of care.    I discussed with the patient the importance of surveillance of the endograft.  The next endograft duplex will be scheduled for 12 months.  The patient will follow up with [place] in 12 months with these studies.  I emphasized the importance of maximal medical management including strict control of blood pressure, blood glucose, and lipid levels, antiplatelet agents, obtaining regular exercise, and cessation of smoking.   The patient was given information about AAA including signs, symptoms, treatment, and how to minimize the risk of enlargement and rupture of aneurysms.    Thank you for allowing [place] to participate in this patient's care.  Clemon Chambers, RN, MSN, FNP-C [facility] Office: [phone]  Clinic Physician: Trula Slade  11/01/2014, 10:51 AM

## 2014-11-01 NOTE — Patient Instructions (Addendum)
Abdominal Aortic Aneurysm An aneurysm is a weakened or damaged part of an artery wall that bulges from the normal force of blood pumping through the body. An abdominal aortic aneurysm is an aneurysm that occurs in the lower part of the aorta, the main artery of the body.  The major concern with an abdominal aortic aneurysm is that it can enlarge and burst (rupture) or blood can flow between the layers of the wall of the aorta through a tear (aorticdissection). Both of these conditions can cause bleeding inside the body and can be life threatening unless diagnosed and treated promptly. CAUSES  The exact cause of an abdominal aortic aneurysm is unknown. Some contributing factors are:   A hardening of the arteries caused by the buildup of fat and other substances in the lining of a blood vessel (arteriosclerosis).  Inflammation of the walls of an artery (arteritis).   Connective tissue diseases, such as Marfan syndrome.   Abdominal trauma.   An infection, such as syphilis or staphylococcus, in the wall of the aorta (infectious aortitis) caused by bacteria. RISK FACTORS  Risk factors that contribute to an abdominal aortic aneurysm may include:  Age older than 60 years.   High blood pressure (hypertension).  Male gender.  Ethnicity (white race).  Obesity.  Family history of aneurysm (first degree relatives only).  Tobacco use. PREVENTION  The following healthy lifestyle habits may help decrease your risk of abdominal aortic aneurysm:  Quitting smoking. Smoking can raise your blood pressure and cause arteriosclerosis.  Limiting or avoiding alcohol.  Keeping your blood pressure, blood sugar level, and cholesterol levels within normal limits.  Decreasing your salt intake. In somepeople, too much salt can raise blood pressure and increase your risk of abdominal aortic aneurysm.  Eating a diet low in saturated fats and cholesterol.  Increasing your fiber intake by including  whole grains, vegetables, and fruits in your diet. Eating these foods may help lower blood pressure.  Maintaining a healthy weight.  Staying physically active and exercising regularly. SYMPTOMS  The symptoms of abdominal aortic aneurysm may vary depending on the size and rate of growth of the aneurysm.Most grow slowly and do not have any symptoms. When symptoms do occur, they may include:  Pain (abdomen, side, lower back, or groin). The pain may vary in intensity. A sudden onset of severe pain may indicate that the aneurysm has ruptured.  Feeling full after eating only small amounts of food.  Nausea or vomiting or both.  Feeling a pulsating lump in the abdomen.  Feeling faint or passing out. DIAGNOSIS  Since most unruptured abdominal aortic aneurysms have no symptoms, they are often discovered during diagnostic exams for other conditions. An aneurysm may be found during the following procedures:  Ultrasonography (A one-time screening for abdominal aortic aneurysm by ultrasonography is also recommended for all men aged 65-75 years who have ever smoked).  X-ray exams.  A computed tomography (CT).  Magnetic resonance imaging (MRI).  Angiography or arteriography. TREATMENT  Treatment of an abdominal aortic aneurysm depends on the size of your aneurysm, your age, and risk factors for rupture. Medication to control blood pressure and pain may be used to manage aneurysms smaller than 6 cm. Regular monitoring for enlargement may be recommended by your caregiver if:  The aneurysm is 3-4 cm in size (an annual ultrasonography may be recommended).  The aneurysm is 4-4.5 cm in size (an ultrasonography every 6 months may be recommended).  The aneurysm is larger than 4.5 cm in   size (your caregiver may ask that you be examined by a vascular surgeon). If your aneurysm is larger than 6 cm, surgical repair may be recommended. There are two main methods for repair of an aneurysm:   Endovascular  repair (a minimally invasive surgery). This is done most often.  Open repair. This method is used if an endovascular repair is not possible. Document Released: 07/18/2005 Document Revised: 02/02/2013 Document Reviewed: 11/07/2012 ExitCare Patient Information 2015 [employer]. This information is not intended to replace advice given to you by your health care provider. Make sure you discuss any questions you have with your health care provider.   Smoking Cessation Quitting smoking is important to your health and has many advantages. However, it is not always easy to quit since nicotine is a very addictive drug. Oftentimes, people try 3 times or more before being able to quit. This document explains the best ways for you to prepare to quit smoking. Quitting takes hard work and a lot of effort, but you can do it. ADVANTAGES OF QUITTING SMOKING  You will live longer, feel better, and live better.  Your body will feel the impact of quitting smoking almost immediately.  Within 20 minutes, blood pressure decreases. Your pulse returns to its normal level.  After 8 hours, carbon monoxide levels in the blood return to normal. Your oxygen level increases.  After 24 hours, the chance of having a heart attack starts to decrease. Your breath, hair, and body stop smelling like smoke.  After 48 hours, damaged nerve endings begin to recover. Your sense of taste and smell improve.  After 72 hours, the body is virtually free of nicotine. Your bronchial tubes relax and breathing becomes easier.  After 2 to 12 weeks, lungs can hold more air. Exercise becomes easier and circulation improves.  The risk of having a heart attack, stroke, cancer, or lung disease is greatly reduced.  After 1 year, the risk of coronary heart disease is cut in half.  After 5 years, the risk of stroke falls to the same as a nonsmoker.  After 10 years, the risk of lung cancer is cut in half and the risk of other cancers  decreases significantly.  After 15 years, the risk of coronary heart disease drops, usually to the level of a nonsmoker.  If you are pregnant, quitting smoking will improve your chances of having a healthy baby.  The people you live with, especially any children, will be healthier.  You will have extra money to spend on things other than cigarettes. QUESTIONS TO THINK ABOUT BEFORE ATTEMPTING TO QUIT You may want to talk about your answers with your health care provider.  Why do you want to quit?  If you tried to quit in the past, what helped and what did not?  What will be the most difficult situations for you after you quit? How will you plan to handle them?  Who can help you through the tough times? Your family? Friends? A health care provider?  What pleasures do you get from smoking? What ways can you still get pleasure if you quit? Here are some questions to ask your health care provider:  How can you help me to be successful at quitting?  What medicine do you think would be best for me and how should I take it?  What should I do if I need more help?  What is smoking withdrawal like? How can I get information on withdrawal? GET READY  Set a quit   date.  Change your environment by getting rid of all cigarettes, ashtrays, matches, and lighters in your home, car, or work. Do not let people smoke in your home.  Review your past attempts to quit. Think about what worked and what did not. GET SUPPORT AND ENCOURAGEMENT You have a better chance of being successful if you have help. You can get support in many ways.  Tell your family, friends, and coworkers that you are going to quit and need their support. Ask them not to smoke around you.  Get individual, group, or telephone counseling and support. Programs are available at local hospitals and health centers. Call your local health department for information about programs in your area.  Spiritual beliefs and practices may  help some smokers quit.  Download a "quit meter" on your computer to keep track of quit statistics, such as how long you have gone without smoking, cigarettes not smoked, and money saved.  Get a self-help book about quitting smoking and staying off tobacco. LEARN NEW SKILLS AND BEHAVIORS  Distract yourself from urges to smoke. Talk to someone, go for a walk, or occupy your time with a task.  Change your normal routine. Take a different route to work. Drink tea instead of coffee. Eat breakfast in a different place.  Reduce your stress. Take a hot bath, exercise, or read a book.  Plan something enjoyable to do every day. Reward yourself for not smoking.  Explore interactive web-based programs that specialize in helping you quit. GET MEDICINE AND USE IT CORRECTLY Medicines can help you stop smoking and decrease the urge to smoke. Combining medicine with the above behavioral methods and support can greatly increase your chances of successfully quitting smoking.  Nicotine replacement therapy helps deliver nicotine to your body without the negative effects and risks of smoking. Nicotine replacement therapy includes nicotine gum, lozenges, inhalers, nasal sprays, and skin patches. Some may be available over-the-counter and others require a prescription.  Antidepressant medicine helps people abstain from smoking, but how this works is unknown. This medicine is available by prescription.  Nicotinic receptor partial agonist medicine simulates the effect of nicotine in your brain. This medicine is available by prescription. Ask your health care provider for advice about which medicines to use and how to use them based on your health history. Your health care provider will tell you what side effects to look out for if you choose to be on a medicine or therapy. Carefully read the information on the package. Do not use any other product containing nicotine while using a nicotine replacement product.    RELAPSE OR DIFFICULT SITUATIONS Most relapses occur within the first 3 months after quitting. Do not be discouraged if you start smoking again. Remember, most people try several times before finally quitting. You may have symptoms of withdrawal because your body is used to nicotine. You may crave cigarettes, be irritable, feel very hungry, cough often, get headaches, or have difficulty concentrating. The withdrawal symptoms are only temporary. They are strongest when you first quit, but they will go away within 10-14 days. To reduce the chances of relapse, try to:  Avoid drinking alcohol. Drinking lowers your chances of successfully quitting.  Reduce the amount of caffeine you consume. Once you quit smoking, the amount of caffeine in your body increases and can give you symptoms, such as a rapid heartbeat, sweating, and anxiety.  Avoid smokers because they can make you want to smoke.  Do not let weight gain distract you. Many   smokers will gain weight when they quit, usually less than 10 pounds. Eat a healthy diet and stay active. You can always lose the weight gained after you quit.  Find ways to improve your mood other than smoking. FOR MORE INFORMATION  www.smokefree.gov  Document Released: 10/02/2001 Document Revised: 02/22/2014 Document Reviewed: 01/17/2012 ExitCare Patient Information 2015 [employer]. This information is not intended to replace advice given to you by your health care provider. Make sure you discuss any questions you have with your health care provider.   Smoking Cessation, Tips for Success If you are ready to quit smoking, congratulations! You have chosen to help yourself be healthier. Cigarettes bring nicotine, tar, carbon monoxide, and other irritants into your body. Your lungs, heart, and blood vessels will be able to work better without these poisons. There are many different ways to quit smoking. Nicotine gum, nicotine patches, a nicotine inhaler, or nicotine  nasal spray can help with physical craving. Hypnosis, support groups, and medicines help break the habit of smoking. WHAT THINGS CAN I DO TO MAKE QUITTING EASIER?  Here are some tips to help you quit for good:  Pick a date when you will quit smoking completely. Tell all of your friends and family about your plan to quit on that date.  Do not try to slowly cut down on the number of cigarettes you are smoking. Pick a quit date and quit smoking completely starting on that day.  Throw away all cigarettes.   Clean and remove all ashtrays from your home, work, and car.  On a card, write down your reasons for quitting. Carry the card with you and read it when you get the urge to smoke.  Cleanse your body of nicotine. Drink enough water and fluids to keep your urine clear or pale yellow. Do this after quitting to flush the nicotine from your body.  Learn to predict your moods. Do not let a bad situation be your excuse to have a cigarette. Some situations in your life might tempt you into wanting a cigarette.  Never have "just one" cigarette. It leads to wanting another and another. Remind yourself of your decision to quit.  Change habits associated with smoking. If you smoked while driving or when feeling stressed, try other activities to replace smoking. Stand up when drinking your coffee. Brush your teeth after eating. Sit in a different chair when you read the paper. Avoid alcohol while trying to quit, and try to drink fewer caffeinated beverages. Alcohol and caffeine may urge you to smoke.  Avoid foods and drinks that can trigger a desire to smoke, such as sugary or spicy foods and alcohol.  Ask people who smoke not to smoke around you.  Have something planned to do right after eating or having a cup of coffee. For example, plan to take a walk or exercise.  Try a relaxation exercise to calm you down and decrease your stress. Remember, you may be tense and nervous for the first 2 weeks after  you quit, but this will pass.  Find new activities to keep your hands busy. Play with a pen, coin, or rubber band. Doodle or draw things on paper.  Brush your teeth right after eating. This will help cut down on the craving for the taste of tobacco after meals. You can also try mouthwash.   Use oral substitutes in place of cigarettes. Try using lemon drops, carrots, cinnamon sticks, or chewing gum. Keep them handy so they are available when you   have the urge to smoke.  When you have the urge to smoke, try deep breathing.  Designate your home as a nonsmoking area.  If you are a heavy smoker, ask your health care provider about a prescription for nicotine chewing gum. It can ease your withdrawal from nicotine.  Reward yourself. Set aside the cigarette money you save and buy yourself something nice.  Look for support from others. Join a support group or smoking cessation program. Ask someone at home or at work to help you with your plan to quit smoking.  Always ask yourself, "Do I need this cigarette or is this just a reflex?" Tell yourself, "Today, I choose not to smoke," or "I do not want to smoke." You are reminding yourself of your decision to quit.  Do not replace cigarette smoking with electronic cigarettes (commonly called e-cigarettes). The safety of e-cigarettes is unknown, and some may contain harmful chemicals.  If you relapse, do not give up! Plan ahead and think about what you will do the next time you get the urge to smoke. HOW WILL I FEEL WHEN I QUIT SMOKING? You may have symptoms of withdrawal because your body is used to nicotine (the addictive substance in cigarettes). You may crave cigarettes, be irritable, feel very hungry, cough often, get headaches, or have difficulty concentrating. The withdrawal symptoms are only temporary. They are strongest when you first quit but will go away within 10-14 days. When withdrawal symptoms occur, stay in control. Think about your reasons  for quitting. Remind yourself that these are signs that your body is healing and getting used to being without cigarettes. Remember that withdrawal symptoms are easier to treat than the major diseases that smoking can cause.  Even after the withdrawal is over, expect periodic urges to smoke. However, these cravings are generally short lived and will go away whether you smoke or not. Do not smoke! WHAT RESOURCES ARE AVAILABLE TO HELP ME QUIT SMOKING? Your health care provider can direct you to community resources or hospitals for support, which may include:  Group support.  Education.  Hypnosis.  Therapy. Document Released: 07/06/2004 Document Revised: 02/22/2014 Document Reviewed: 03/26/2013 ExitCare Patient Information 2015 [employer]. This information is not intended to replace advice given to you by your health care provider. Make sure you discuss any questions you have with your health care provider.   

## 2014-12-10 DIAGNOSIS — D0439 Carcinoma in situ of skin of other parts of face: Secondary | ICD-10-CM | POA: Diagnosis not present

## 2014-12-10 DIAGNOSIS — Z85820 Personal history of malignant melanoma of skin: Secondary | ICD-10-CM | POA: Diagnosis not present

## 2014-12-10 DIAGNOSIS — R69 Illness, unspecified: Secondary | ICD-10-CM | POA: Diagnosis not present

## 2014-12-10 DIAGNOSIS — C44519 Basal cell carcinoma of skin of other part of trunk: Secondary | ICD-10-CM | POA: Diagnosis not present

## 2014-12-10 DIAGNOSIS — D0430 Carcinoma in situ of skin of unspecified part of face: Secondary | ICD-10-CM | POA: Diagnosis not present

## 2014-12-10 DIAGNOSIS — D485 Neoplasm of uncertain behavior of skin: Secondary | ICD-10-CM | POA: Diagnosis not present

## 2014-12-10 DIAGNOSIS — Z85828 Personal history of other malignant neoplasm of skin: Secondary | ICD-10-CM | POA: Diagnosis not present

## 2014-12-10 DIAGNOSIS — C44320 Squamous cell carcinoma of skin of unspecified parts of face: Secondary | ICD-10-CM | POA: Diagnosis not present

## 2014-12-10 DIAGNOSIS — L821 Other seborrheic keratosis: Secondary | ICD-10-CM | POA: Diagnosis not present

## 2014-12-10 DIAGNOSIS — C44129 Squamous cell carcinoma of skin of left eyelid, including canthus: Secondary | ICD-10-CM | POA: Diagnosis not present

## 2014-12-10 DIAGNOSIS — C44329 Squamous cell carcinoma of skin of other parts of face: Secondary | ICD-10-CM | POA: Diagnosis not present

## 2015-02-03 DIAGNOSIS — C44319 Basal cell carcinoma of skin of other parts of face: Secondary | ICD-10-CM | POA: Diagnosis not present

## 2015-02-10 DIAGNOSIS — C44519 Basal cell carcinoma of skin of other part of trunk: Secondary | ICD-10-CM | POA: Diagnosis not present

## 2015-03-03 DIAGNOSIS — L905 Scar conditions and fibrosis of skin: Secondary | ICD-10-CM | POA: Diagnosis not present

## 2015-03-03 DIAGNOSIS — L821 Other seborrheic keratosis: Secondary | ICD-10-CM | POA: Diagnosis not present

## 2015-03-03 DIAGNOSIS — D485 Neoplasm of uncertain behavior of skin: Secondary | ICD-10-CM | POA: Diagnosis not present

## 2015-03-03 DIAGNOSIS — L819 Disorder of pigmentation, unspecified: Secondary | ICD-10-CM | POA: Diagnosis not present

## 2015-03-03 DIAGNOSIS — L570 Actinic keratosis: Secondary | ICD-10-CM | POA: Diagnosis not present

## 2015-03-03 DIAGNOSIS — D0439 Carcinoma in situ of skin of other parts of face: Secondary | ICD-10-CM | POA: Diagnosis not present

## 2015-05-20 ENCOUNTER — Ambulatory Visit (INDEPENDENT_AMBULATORY_CARE_PROVIDER_SITE_OTHER): Payer: Commercial Managed Care - HMO | Admitting: Urology

## 2015-05-20 DIAGNOSIS — N403 Nodular prostate with lower urinary tract symptoms: Secondary | ICD-10-CM

## 2015-05-20 DIAGNOSIS — N3020 Other chronic cystitis without hematuria: Secondary | ICD-10-CM

## 2015-06-08 DIAGNOSIS — L821 Other seborrheic keratosis: Secondary | ICD-10-CM | POA: Diagnosis not present

## 2015-06-08 DIAGNOSIS — C44622 Squamous cell carcinoma of skin of right upper limb, including shoulder: Secondary | ICD-10-CM | POA: Diagnosis not present

## 2015-06-08 DIAGNOSIS — L570 Actinic keratosis: Secondary | ICD-10-CM | POA: Diagnosis not present

## 2015-06-08 DIAGNOSIS — L812 Freckles: Secondary | ICD-10-CM | POA: Diagnosis not present

## 2015-06-08 DIAGNOSIS — D485 Neoplasm of uncertain behavior of skin: Secondary | ICD-10-CM | POA: Diagnosis not present

## 2015-07-07 DIAGNOSIS — Z1389 Encounter for screening for other disorder: Secondary | ICD-10-CM | POA: Diagnosis not present

## 2015-07-07 DIAGNOSIS — N400 Enlarged prostate without lower urinary tract symptoms: Secondary | ICD-10-CM | POA: Diagnosis not present

## 2015-07-07 DIAGNOSIS — Z6822 Body mass index (BMI) 22.0-22.9, adult: Secondary | ICD-10-CM | POA: Diagnosis not present

## 2015-07-07 DIAGNOSIS — I714 Abdominal aortic aneurysm, without rupture: Secondary | ICD-10-CM | POA: Diagnosis not present

## 2015-07-07 DIAGNOSIS — E782 Mixed hyperlipidemia: Secondary | ICD-10-CM | POA: Diagnosis not present

## 2015-07-07 DIAGNOSIS — I10 Essential (primary) hypertension: Secondary | ICD-10-CM | POA: Diagnosis not present

## 2015-07-07 DIAGNOSIS — Z0000 Encounter for general adult medical examination without abnormal findings: Secondary | ICD-10-CM | POA: Diagnosis not present

## 2015-07-07 DIAGNOSIS — I779 Disorder of arteries and arterioles, unspecified: Secondary | ICD-10-CM | POA: Diagnosis not present

## 2015-07-07 DIAGNOSIS — C4490 Unspecified malignant neoplasm of skin, unspecified: Secondary | ICD-10-CM | POA: Diagnosis not present

## 2015-07-07 DIAGNOSIS — J439 Emphysema, unspecified: Secondary | ICD-10-CM | POA: Diagnosis not present

## 2015-08-04 ENCOUNTER — Other Ambulatory Visit: Payer: Self-pay | Admitting: Thoracic Surgery (Cardiothoracic Vascular Surgery)

## 2015-08-04 DIAGNOSIS — J398 Other specified diseases of upper respiratory tract: Secondary | ICD-10-CM

## 2015-08-15 DIAGNOSIS — I714 Abdominal aortic aneurysm, without rupture: Secondary | ICD-10-CM | POA: Diagnosis not present

## 2015-08-15 DIAGNOSIS — N400 Enlarged prostate without lower urinary tract symptoms: Secondary | ICD-10-CM | POA: Diagnosis not present

## 2015-08-15 DIAGNOSIS — Z1389 Encounter for screening for other disorder: Secondary | ICD-10-CM | POA: Diagnosis not present

## 2015-08-15 DIAGNOSIS — J449 Chronic obstructive pulmonary disease, unspecified: Secondary | ICD-10-CM | POA: Diagnosis not present

## 2015-08-15 DIAGNOSIS — Z6822 Body mass index (BMI) 22.0-22.9, adult: Secondary | ICD-10-CM | POA: Diagnosis not present

## 2015-08-16 ENCOUNTER — Ambulatory Visit
Admission: RE | Admit: 2015-08-16 | Discharge: 2015-08-16 | Disposition: A | Discharge status: Home / Self Care | Payer: Commercial Managed Care - HMO | Source: Ambulatory Visit | Attending: Thoracic Surgery (Cardiothoracic Vascular Surgery) | Admitting: Thoracic Surgery (Cardiothoracic Vascular Surgery)

## 2015-08-16 ENCOUNTER — Ambulatory Visit (INDEPENDENT_AMBULATORY_CARE_PROVIDER_SITE_OTHER): Payer: Commercial Managed Care - HMO | Admitting: Thoracic Surgery (Cardiothoracic Vascular Surgery)

## 2015-08-16 ENCOUNTER — Encounter: Payer: Self-pay | Admitting: Thoracic Surgery (Cardiothoracic Vascular Surgery)

## 2015-08-16 VITALS — BP 155/80 | HR 76 | Resp 20 | Ht 71.0 in | Wt 155.0 lb

## 2015-08-16 DIAGNOSIS — J398 Other specified diseases of upper respiratory tract: Secondary | ICD-10-CM

## 2015-08-16 DIAGNOSIS — D179 Benign lipomatous neoplasm, unspecified: Secondary | ICD-10-CM | POA: Diagnosis not present

## 2015-08-16 DIAGNOSIS — R222 Localized swelling, mass and lump, trunk: Secondary | ICD-10-CM | POA: Diagnosis not present

## 2015-08-16 DIAGNOSIS — R0602 Shortness of breath: Secondary | ICD-10-CM | POA: Diagnosis not present

## 2015-08-16 DIAGNOSIS — R05 Cough: Secondary | ICD-10-CM | POA: Diagnosis not present

## 2015-08-16 NOTE — Progress Notes (Signed)
[address]       [place] [address]             [phone]       HPI: Mr. Walker returns today for a scheduled follow-up visit.  He is an 81 year old man with a history of tobacco abuse and COPD. I did a bronchoscopic resection of a tracheal mass in 2013. 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 We followed him with annual CT scans since then with no evidence of recurrence.  He says he has trouble breathing when there is a lot of pollen, but otherwise hasn't had any major issues. He still is smoking.  Past Medical History  Diagnosis Date  . Urinary frequency   . Bronchitis   . Hypercholesterolemia     takes Niacin nightly  . CAD (coronary artery disease) stent in 2007  . Tracheal mass     s/p endoscopic resection  . Hypertension     takes Hyzaar daily  . Chest congestion     occasionally per pt and wife  . Shortness of breath     with exertion  . Pneumonia     last time 1yrs ago  . Bruises easily   . Dry skin   . Hx of colonic polyps   . Enlarged prostate     takes Proscar  . Abdominal aneurysm ([place])     scheduled procedure to take care of this 07/04/12 with Dr.Brabham  . Cancer ([place])     basal cell ca on face  . MI (myocardial infarction) ([place]) 1996 and 2006   . COPD (chronic obstructive pulmonary disease) ([place])     PT IS A SMOKER  . Benign prostatic hyperplasia with urinary obstruction 12/09/2013  . Bladder diverticulum        Current Outpatient Prescriptions  Medication Sig Dispense Refill  . albuterol (PROVENTIL) (2.5 MG/3ML) 0.083% nebulizer solution Take 2.5 mg by nebulization daily as needed for wheezing or shortness of breath. And/or Congestion    . aspirin 325 MG tablet Take 325 mg by mouth daily.    . cholecalciferol (VITAMIN D) 1000 UNITS tablet Take 1,000 Units by mouth daily.    . ferrous sulfate 325 (65 FE) MG EC tablet Take 325 mg by mouth daily with breakfast.    . finasteride (PROSCAR) 5 MG tablet Take 5 mg by mouth  daily.    Marland Kitchen losartan-hydrochlorothiazide (HYZAAR) 50-12.5 MG per tablet Take 1 tablet by mouth every morning.     . niacin 500 MG tablet Take 500 mg by mouth daily.      No current facility-administered medications for this visit.    Physical Exam BP 155/80 mmHg  Pulse 76  Resp 20  Ht 5\' 11"  (1.803 m)  Wt 155 lb (70.308 kg)  BMI 21.63 kg/m2  SpO78 55% 81 year old man in no acute distress Alert and oriented 3 with no focal motor deficits, hard of hearing No cervical or supraclavicular adenopathy Cardiac regular rate and rhythm Lungs diminished breath sounds bilaterally, faint wheezing bilaterally  Diagnostic Tests: CT CHEST WITHOUT CONTRAST  TECHNIQUE: Multidetector CT imaging of the chest was performed following the standard protocol without IV contrast.  COMPARISON: CT 08/17/2014  FINDINGS: Mediastinum/Nodes: No axillary or supraclavicular lymphadenopathy. Thyroid gland is normal. No mediastinal hilar adenopathy. No pericardial fluid. Coronary calcifications are present.  There is no endobronchial lesion within the trachea or proximal bronchi.  Lungs/Pleura: Centrilobular emphysema in the upper lobes. Mild apical  scarring and nodularity at the LEFT lung apex. Paraseptal emphysema at the lung bases.  Upper abdomen: Upper portion of abdominal aortic stent graft noted. Adrenal glands normal. No upper abdominal lymphadenopathy.  Musculoskeletal: No aggressive osseous lesion.  IMPRESSION: 1. No endobronchial or endotracheal lesion identified. 2. Emphysematous change the lungs without acute findings or nodularity. 3. Atherosclerotic calcification of coronary arteries.   Electronically Signed  By: Suzy Bouchard M.D.  On: 08/16/2015 15:11  I personally reviewed the CT chest and concur with the findings as noted above.  Impression: 81 year old man who is now 3 years out from a bronchoscopic resection of the lipomatous mass from the trachea. He has  no evidence of recurrent disease.  Given that this was a benign lesion and he is now 3 years out, I do not think that he follow-up is necessary. I suspect that if he is going to have a local recurrence we would see some evidence at this point.  Plan: Follow-up with Dr. Gerarda Fraction  I will be happy to see him back at any time if I can be of any further assistance with his care.  Melrose Nakayama, MD [facility] [phone]

## 2015-09-07 DIAGNOSIS — L570 Actinic keratosis: Secondary | ICD-10-CM | POA: Diagnosis not present

## 2015-09-07 DIAGNOSIS — D485 Neoplasm of uncertain behavior of skin: Secondary | ICD-10-CM | POA: Diagnosis not present

## 2015-09-07 DIAGNOSIS — C44529 Squamous cell carcinoma of skin of other part of trunk: Secondary | ICD-10-CM | POA: Diagnosis not present

## 2015-10-28 ENCOUNTER — Encounter: Payer: Self-pay | Admitting: Family

## 2015-11-07 ENCOUNTER — Encounter: Payer: Self-pay | Admitting: Family

## 2015-11-07 ENCOUNTER — Ambulatory Visit (INDEPENDENT_AMBULATORY_CARE_PROVIDER_SITE_OTHER): Payer: Commercial Managed Care - HMO | Admitting: Family

## 2015-11-07 ENCOUNTER — Ambulatory Visit (HOSPITAL_COMMUNITY)
Admission: RE | Admit: 2015-11-07 | Discharge: 2015-11-07 | Disposition: A | Discharge status: Home / Self Care | Payer: Commercial Managed Care - HMO | Source: Ambulatory Visit | Attending: Family | Admitting: Family

## 2015-11-07 VITALS — BP 148/78 | HR 70 | Ht 71.0 in | Wt 156.0 lb

## 2015-11-07 DIAGNOSIS — I714 Abdominal aortic aneurysm, without rupture, unspecified: Secondary | ICD-10-CM

## 2015-11-07 DIAGNOSIS — Z720 Tobacco use: Secondary | ICD-10-CM

## 2015-11-07 DIAGNOSIS — Z95828 Presence of other vascular implants and grafts: Secondary | ICD-10-CM | POA: Insufficient documentation

## 2015-11-07 DIAGNOSIS — F17200 Nicotine dependence, unspecified, uncomplicated: Secondary | ICD-10-CM

## 2015-11-07 NOTE — Patient Instructions (Signed)
Smoking Cessation, Tips for Success If you are ready to quit smoking, congratulations! You have chosen to help yourself be healthier. Cigarettes bring nicotine, tar, carbon monoxide, and other irritants into your body. Your lungs, heart, and blood vessels will be able to work better without these poisons. There are many different ways to quit smoking. Nicotine gum, nicotine patches, a nicotine inhaler, or nicotine nasal spray can help with physical craving. Hypnosis, support groups, and medicines help break the habit of smoking. WHAT THINGS CAN I DO TO MAKE QUITTING EASIER?  Here are some tips to help you quit for good:  Pick a date when you will quit smoking completely. Tell all of your friends and family about your plan to quit on that date.  Do not try to slowly cut down on the number of cigarettes you are smoking. Pick a quit date and quit smoking completely starting on that day.  Throw away all cigarettes.   Clean and remove all ashtrays from your home, work, and car.  On a card, write down your reasons for quitting. Carry the card with you and read it when you get the urge to smoke.  Cleanse your body of nicotine. Drink enough water and fluids to keep your urine clear or pale yellow. Do this after quitting to flush the nicotine from your body.  Learn to predict your moods. Do not let a bad situation be your excuse to have a cigarette. Some situations in your life might tempt you into wanting a cigarette.  Never have "just one" cigarette. It leads to wanting another and another. Remind yourself of your decision to quit.  Change habits associated with smoking. If you smoked while driving or when feeling stressed, try other activities to replace smoking. Stand up when drinking your coffee. Brush your teeth after eating. Sit in a different chair when you read the paper. Avoid alcohol while trying to quit, and try to drink fewer caffeinated beverages. Alcohol and caffeine may urge you to  smoke.  Avoid foods and drinks that can trigger a desire to smoke, such as sugary or spicy foods and alcohol.  Ask people who smoke not to smoke around you.  Have something planned to do right after eating or having a cup of coffee. For example, plan to take a walk or exercise.  Try a relaxation exercise to calm you down and decrease your stress. Remember, you may be tense and nervous for the first 2 weeks after you quit, but this will pass.  Find new activities to keep your hands busy. Play with a pen, coin, or rubber band. Doodle or draw things on paper.  Brush your teeth right after eating. This will help cut down on the craving for the taste of tobacco after meals. You can also try mouthwash.   Use oral substitutes in place of cigarettes. Try using lemon drops, carrots, cinnamon sticks, or chewing gum. Keep them handy so they are available when you have the urge to smoke.  When you have the urge to smoke, try deep breathing.  Designate your home as a nonsmoking area.  If you are a heavy smoker, ask your health care provider about a prescription for nicotine chewing gum. It can ease your withdrawal from nicotine.  Reward yourself. Set aside the cigarette money you save and buy yourself something nice.  Look for support from others. Join a support group or smoking cessation program. Ask someone at home or at work to help you with your plan   to quit smoking.  Always ask yourself, "Do I need this cigarette or is this just a reflex?" Tell yourself, "Today, I choose not to smoke," or "I do not want to smoke." You are reminding yourself of your decision to quit.  Do not replace cigarette smoking with electronic cigarettes (commonly called e-cigarettes). The safety of e-cigarettes is unknown, and some may contain harmful chemicals.  If you relapse, do not give up! Plan ahead and think about what you will do the next time you get the urge to smoke. HOW WILL I FEEL WHEN I QUIT SMOKING? You  may have symptoms of withdrawal because your body is used to nicotine (the addictive substance in cigarettes). You may crave cigarettes, be irritable, feel very hungry, cough often, get headaches, or have difficulty concentrating. The withdrawal symptoms are only temporary. They are strongest when you first quit but will go away within 10-14 days. When withdrawal symptoms occur, stay in control. Think about your reasons for quitting. Remind yourself that these are signs that your body is healing and getting used to being without cigarettes. Remember that withdrawal symptoms are easier to treat than the major diseases that smoking can cause.  Even after the withdrawal is over, expect periodic urges to smoke. However, these cravings are generally short lived and will go away whether you smoke or not. Do not smoke! WHAT RESOURCES ARE AVAILABLE TO HELP ME QUIT SMOKING? Your health care provider can direct you to community resources or hospitals for support, which may include:  Group support.  Education.  Hypnosis.  Therapy.   This information is not intended to replace advice given to you by your health care provider. Make sure you discuss any questions you have with your health care provider.   Document Released: 07/06/2004 Document Revised: 10/29/2014 Document Reviewed: 03/26/2013 Elsevier Interactive Patient Education 2016 Elsevier Inc.    Steps to Quit Smoking  Smoking tobacco can be harmful to your health and can affect almost every organ in your body. Smoking puts you, and those around you, at risk for developing many serious chronic diseases. Quitting smoking is difficult, but it is one of the best things that you can do for your health. It is never too late to quit. WHAT ARE THE BENEFITS OF QUITTING SMOKING? When you quit smoking, you lower your risk of developing serious diseases and conditions, such as:  Lung cancer or lung disease, such as COPD.  Heart disease.  Stroke.  Heart  attack.  Infertility.  Osteoporosis and bone fractures. Additionally, symptoms such as coughing, wheezing, and shortness of breath may get better when you quit. You may also find that you get sick less often because your body is stronger at fighting off colds and infections. If you are pregnant, quitting smoking can help to reduce your chances of having a baby of low birth weight. HOW DO I GET READY TO QUIT? When you decide to quit smoking, create a plan to make sure that you are successful. Before you quit:  Pick a date to quit. Set a date within the next two weeks to give you time to prepare.  Write down the reasons why you are quitting. Keep this list in places where you will see it often, such as on your bathroom mirror or in your car or wallet.  Identify the people, places, things, and activities that make you want to smoke (triggers) and avoid them. Make sure to take these actions:  Throw away all cigarettes at home, at work,   and in your car.  Throw away smoking accessories, such as ashtrays and lighters.  Clean your car and make sure to empty the ashtray.  Clean your home, including curtains and carpets.  Tell your family, friends, and coworkers that you are quitting. Support from your loved ones can make quitting easier.  Talk with your health care provider about your options for quitting smoking.  Find out what treatment options are covered by your health insurance. WHAT STRATEGIES CAN I USE TO QUIT SMOKING?  Talk with your healthcare provider about different strategies to quit smoking. Some strategies include:  Quitting smoking altogether instead of gradually lessening how much you smoke over a period of time. Research shows that quitting "cold turkey" is more successful than gradually quitting.  Attending in-person counseling to help you build problem-solving skills. You are more likely to have success in quitting if you attend several counseling sessions. Even short  sessions of 10 minutes can be effective.  Finding resources and support systems that can help you to quit smoking and remain smoke-free after you quit. These resources are most helpful when you use them often. They can include:  Online chats with a counselor.  Telephone quitlines.  Printed self-help materials.  Support groups or group counseling.  Text messaging programs.  Mobile phone applications.  Taking medicines to help you quit smoking. (If you are pregnant or breastfeeding, talk with your health care provider first.) Some medicines contain nicotine and some do not. Both types of medicines help with cravings, but the medicines that include nicotine help to relieve withdrawal symptoms. Your health care provider may recommend:  Nicotine patches, gum, or lozenges.  Nicotine inhalers or sprays.  Non-nicotine medicine that is taken by mouth. Talk with your health care provider about combining strategies, such as taking medicines while you are also receiving in-person counseling. Using these two strategies together makes you more likely to succeed in quitting than if you used either strategy on its own. If you are pregnant or breastfeeding, talk with your health care provider about finding counseling or other support strategies to quit smoking. Do not take medicine to help you quit smoking unless told to do so by your health care provider. WHAT THINGS CAN I DO TO MAKE IT EASIER TO QUIT? Quitting smoking might feel overwhelming at first, but there is a lot that you can do to make it easier. Take these important actions:  Reach out to your family and friends and ask that they support and encourage you during this time. Call telephone quitlines, reach out to support groups, or work with a counselor for support.  Ask people who smoke to avoid smoking around you.  Avoid places that trigger you to smoke, such as bars, parties, or smoke-break areas at work.  Spend time around people who do  not smoke.  Lessen stress in your life, because stress can be a smoking trigger for some people. To lessen stress, try:  Exercising regularly.  Deep-breathing exercises.  Yoga.  Meditating.  Performing a body scan. This involves closing your eyes, scanning your body from head to toe, and noticing which parts of your body are particularly tense. Purposefully relax the muscles in those areas.  Download or purchase mobile phone or tablet apps (applications) that can help you stick to your quit plan by providing reminders, tips, and encouragement. There are many free apps, such as QuitGuide from the CDC (Centers for Disease Control and Prevention). You can find other support for quitting smoking (smoking   cessation) through smokefree.gov and other websites. HOW WILL I FEEL WHEN I QUIT SMOKING? Within the first 24 hours of quitting smoking, you may start to feel some withdrawal symptoms. These symptoms are usually most noticeable 2-3 days after quitting, but they usually do not last beyond 2-3 weeks. Changes or symptoms that you might experience include:  Mood swings.  Restlessness, anxiety, or irritation.  Difficulty concentrating.  Dizziness.  Strong cravings for sugary foods in addition to nicotine.  Mild weight gain.  Constipation.  Nausea.  Coughing or a sore throat.  Changes in how your medicines work in your body.  A depressed mood.  Difficulty sleeping (insomnia). After the first 2-3 weeks of quitting, you may start to notice more positive results, such as:  Improved sense of smell and taste.  Decreased coughing and sore throat.  Slower heart rate.  Lower blood pressure.  Clearer skin.  The ability to breathe more easily.  Fewer sick days. Quitting smoking is very challenging for most people. Do not get discouraged if you are not successful the first time. Some people need to make many attempts to quit before they achieve long-term success. Do your best to  stick to your quit plan, and talk with your health care provider if you have any questions or concerns.   This information is not intended to replace advice given to you by your health care provider. Make sure you discuss any questions you have with your health care provider.   Document Released: 10/02/2001 Document Revised: 02/22/2015 Document Reviewed: 02/22/2015 Elsevier Interactive Patient Education 2016 Elsevier Inc.  

## 2015-11-07 NOTE — Progress Notes (Signed)
[facility]  Established [facility]  History of Present Illness  Karl Walker is a 82 y.o. (Dec 31, 1931) male patient of Dr. 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 His initial CT scan showed the graft to be in good position without evidence of endoleak.  At a previous visit Dr. Trula Slade reviewed his carotid duplex from pre-CABG which showed no significant carotid disease.  Pt is regularly physically active, but states his cardiologist told him to not engage in strenuous activity. Had one MI in 2006, one cardiac stent.  He presents today for routine surveillance of the EVAR.  The patient has not had back or abdominal pain.Pt denies any history of stroke or TIA. He denies claudication sx's with walking.  He had melanoma removed left shoulder in 2015, several basal cell carcinomas removed on his face. He had prostate and bladder surgery February 2015, apparently for BPH. He states he voids normally.  He takes a daily 81 mg ASA, no other antiplatelets nor anticoagulants.   Pt Diabetic: No Pt smoker: smoker (1-2 cigarettes/day, started at age 60 or 10, quit several times)   Past Medical History  Diagnosis Date  . Urinary frequency   . Bronchitis   . Hypercholesterolemia     takes Niacin nightly  . CAD (coronary artery disease) stent in 2007  . Tracheal mass     s/p endoscopic resection  . Hypertension     takes Hyzaar daily  . Chest congestion     occasionally per pt and wife  . Shortness of breath     with exertion  . Pneumonia     last time 28yrs ago  . Bruises easily   . Dry skin   . Hx of colonic polyps   . Enlarged prostate     takes Proscar  . Abdominal aneurysm ([place])     scheduled procedure to take care of this 07/04/12 with Dr.Brabham  . Cancer ([place])     basal cell ca on face  . MI (myocardial infarction) ([place]) 1996 and 2006   . COPD (chronic obstructive pulmonary disease) ([place])     PT IS A SMOKER  . Benign  prostatic hyperplasia with urinary obstruction 12/09/2013  . Bladder diverticulum    Past Surgical History  Procedure Laterality Date  . Skin surgery    . Hernia repair      right inguinal  . Coronary angioplasty with stent placement  2006  . Colonoscopy    . Esophagogastroduodenoscopy  Oct 2011    RMR: normal esophagus, J-shaped stomach, mottled gastric mucosa, bulbar erosions: path minimal chronic gastritis  . Video bronchoscopy  06/25/2012    Procedure: VIDEO BRONCHOSCOPY;  Surgeon: Melrose Nakayama, MD;  Location: [facility];  Service: Thoracic;  Laterality: N/A;  Using Snare with biopsy  . Tumor removal  2013    esophagus  . Abdominal aortic aneurysm repair  07-04-12    [facility]AR  . Colonoscopy  Oct 2011    RMR: multiple colon polyps, left-sided diverticula, adenomatous, needs surveillance Oct 2014  . US echocardiography  05/24/2009    mild AI,MR  . Nm myocar perf wall motion  06/18/2012    No ischemia  . Coronary angioplasty with stent placement  12/17/2006    Stent to the RCA  . Transurethral resection of prostate N/A 12/10/2013    Procedure: TRANSURETHRAL RESECTION OF THE PROSTATE (TURP) with bladder biopsy with fulgration;  Surgeon: [name], MD;  Location: [facility];  Service: Urology;  Laterality: N/A;  . Cystoscopy w/ retrogrades Bilateral 12/10/2013    Procedure: CYSTOSCOPY WITH RETROGRADE PYELOGRAM;  Surgeon: [name], MD;  Location: [facility];  Service: Urology;  Laterality: Bilateral;  . Cystoscopy N/A 12/11/2013    Procedure: CYSTOSCOPY with evacuation of clots and fulguration of bleeders. ;  Surgeon: Claybon Jabs, MD;  Location: [facility];  Service: Urology;  Laterality: N/A;   Social History Social History  Substance Use Topics  . Smoking status: Current Every Day Smoker -- 0.25 packs/day for 55 years    Types: Cigarettes  . Smokeless tobacco: Never Used  . Alcohol Use: No   Family History Family History  Problem Relation Age of Onset  . Stroke Brother    Current  Outpatient Prescriptions on File Prior to Visit  Medication Sig Dispense Refill  . albuterol (PROVENTIL) (2.5 MG/3ML) 0.083% nebulizer solution Take 2.5 mg by nebulization daily as needed for wheezing or shortness of breath. And/or Congestion    . aspirin 325 MG tablet Take 325 mg by mouth daily.    . cholecalciferol (VITAMIN D) 1000 UNITS tablet Take 1,000 Units by mouth daily.    . ferrous sulfate 325 (65 FE) MG EC tablet Take 325 mg by mouth daily with breakfast.    . finasteride (PROSCAR) 5 MG tablet Take 5 mg by mouth daily.    Marland Kitchen losartan-hydrochlorothiazide (HYZAAR) 50-12.5 MG per tablet Take 1 tablet by mouth every morning.     . niacin 500 MG tablet Take 500 mg by mouth daily.      No current facility-administered medications on file prior to visit.   Allergies  Allergen Reactions  . Iodine Other (See Comments)    Per pt, issue with shrimp x 20 years ago, ok with CT contrast since     ROS: See HPI for pertinent positives and negatives.  Physical Examination  Filed Vitals:   11/07/15 0950 11/07/15 0959  BP: 154/80 145/78  Pulse: 70   Height: 5\' 11"  (1.803 m)   Weight: 156 lb (70.761 kg)   SpO2: 96%    Body mass index is 21.77 kg/(m^2).  General: A&O x 3, WD.  Pulmonary: Sym exp, decreased air movement in all fields, CTAB, no rales, rhonchi, no wheezing.  Cardiac: RRR, Nl S1, S2, no murmur detected.  Vascular: Vessel Right Left  Radial 2+Palpable 2+Palpable  Carotid without bruit without bruit  Aorta Not palpable N/A  Femoral Palpable Palpable  Popliteal Not palpable Not palpable  PT Palpable Palpable  DP Palpable Palpable   Gastrointestinal: soft, NTND, -G/R, - HSM, - palpable masses, - CVAT B.  Musculoskeletal: M/S 5/5 throughout, Extremities without ischemic changes.  Neurologic: Pain and light touch intact in extremities, Motor exam as listed above, CN 2-12 intact except for hard of hearing.           Non-Invasive Vascular Imaging  EVAR Duplex (Date: 11/07/2015) ABDOMINAL AORTA DUPLEX EVALUATION - POST ENDOVASCULAR REPAIR    INDICATION: Follow up endovascular abdominal aorta repair    PREVIOUS INTERVENTION(S): Endovascular aortic repair performed 07/04/2012    DUPLEX EXAM:      DIAMETER AP (cm) DIAMETER TRANSVERSE (cm) VELOCITIES (cm/sec)  Aorta 4.1 4.3 33  Right Common Iliac 1.3 1.2 81  Left Common Iliac 1.4 1.2 92    Comparison Study       Date DIAMETER AP (cm) DIAMETER TRANSVERSE (cm)  11/01/2014 4.4 4.3     ADDITIONAL FINDINGS:     IMPRESSION: Patent endovascular abdominal aortic  repair with a 4.1 x 4.3 cm sac size    Compared to the previous exam:  Reduction in size of sac      Medical Decision Making  Karl Walker is a 82 y.o. male who presents s/p EVAR (Date: 07/04/2012).  Pt is asymptomatic with a slight reduction in sac size. Unfortunately he continues to smoke, but at a reduced rate. The patient was counseled re smoking cessation and given several free resources re smoking cessation.    I discussed with the patient the importance of surveillance of the endograft.  The next endograft duplex will be scheduled for 12 months.  The patient will follow up with [place] in 12 months with these studies.  I emphasized the importance of maximal medical management including strict control of blood pressure, blood glucose, and lipid levels, antiplatelet agents, obtaining regular exercise, and cessation of smoking.   Thank you for allowing [place] to participate in this patient's care.  Clemon Chambers, RN, MSN, FNP-C [facility] Office: [facility] Physician: Trula Slade  11/07/2015, 10:00 AM

## 2015-11-07 NOTE — Addendum Note (Signed)
Addended by: Dorthula Rue L on: 11/07/2015 02:06 PM   Modules accepted: Orders

## 2015-11-07 NOTE — Addendum Note (Signed)
Addended by: Dorthula Rue L on: 11/07/2015 12:06 PM   Modules accepted: Orders

## 2015-11-10 DIAGNOSIS — D485 Neoplasm of uncertain behavior of skin: Secondary | ICD-10-CM | POA: Diagnosis not present

## 2015-11-10 DIAGNOSIS — C44629 Squamous cell carcinoma of skin of left upper limb, including shoulder: Secondary | ICD-10-CM | POA: Diagnosis not present

## 2015-11-10 DIAGNOSIS — C44529 Squamous cell carcinoma of skin of other part of trunk: Secondary | ICD-10-CM | POA: Diagnosis not present

## 2015-12-12 DIAGNOSIS — Z1389 Encounter for screening for other disorder: Secondary | ICD-10-CM | POA: Diagnosis not present

## 2015-12-12 DIAGNOSIS — Z0000 Encounter for general adult medical examination without abnormal findings: Secondary | ICD-10-CM | POA: Diagnosis not present

## 2015-12-12 DIAGNOSIS — Z6822 Body mass index (BMI) 22.0-22.9, adult: Secondary | ICD-10-CM | POA: Diagnosis not present

## 2016-01-05 DIAGNOSIS — L905 Scar conditions and fibrosis of skin: Secondary | ICD-10-CM | POA: Diagnosis not present

## 2016-01-05 DIAGNOSIS — C44629 Squamous cell carcinoma of skin of left upper limb, including shoulder: Secondary | ICD-10-CM | POA: Diagnosis not present

## 2016-04-13 DIAGNOSIS — Z719 Counseling, unspecified: Secondary | ICD-10-CM | POA: Diagnosis not present

## 2016-04-13 DIAGNOSIS — J449 Chronic obstructive pulmonary disease, unspecified: Secondary | ICD-10-CM | POA: Diagnosis not present

## 2016-04-13 DIAGNOSIS — J209 Acute bronchitis, unspecified: Secondary | ICD-10-CM | POA: Diagnosis not present

## 2016-04-13 DIAGNOSIS — Z6822 Body mass index (BMI) 22.0-22.9, adult: Secondary | ICD-10-CM | POA: Diagnosis not present

## 2016-05-11 ENCOUNTER — Ambulatory Visit (INDEPENDENT_AMBULATORY_CARE_PROVIDER_SITE_OTHER): Payer: Commercial Managed Care - HMO | Admitting: Urology

## 2016-05-11 DIAGNOSIS — N401 Enlarged prostate with lower urinary tract symptoms: Secondary | ICD-10-CM

## 2016-05-11 DIAGNOSIS — R351 Nocturia: Secondary | ICD-10-CM | POA: Diagnosis not present

## 2016-06-14 DIAGNOSIS — D485 Neoplasm of uncertain behavior of skin: Secondary | ICD-10-CM | POA: Diagnosis not present

## 2016-06-14 DIAGNOSIS — L821 Other seborrheic keratosis: Secondary | ICD-10-CM | POA: Diagnosis not present

## 2016-06-14 DIAGNOSIS — Z85820 Personal history of malignant melanoma of skin: Secondary | ICD-10-CM | POA: Diagnosis not present

## 2016-06-14 DIAGNOSIS — L570 Actinic keratosis: Secondary | ICD-10-CM | POA: Diagnosis not present

## 2016-06-14 DIAGNOSIS — C44629 Squamous cell carcinoma of skin of left upper limb, including shoulder: Secondary | ICD-10-CM | POA: Diagnosis not present

## 2016-06-14 DIAGNOSIS — D0462 Carcinoma in situ of skin of left upper limb, including shoulder: Secondary | ICD-10-CM | POA: Diagnosis not present

## 2016-06-14 DIAGNOSIS — Z85828 Personal history of other malignant neoplasm of skin: Secondary | ICD-10-CM | POA: Diagnosis not present

## 2016-06-14 DIAGNOSIS — D1801 Hemangioma of skin and subcutaneous tissue: Secondary | ICD-10-CM | POA: Diagnosis not present

## 2016-06-14 DIAGNOSIS — L814 Other melanin hyperpigmentation: Secondary | ICD-10-CM | POA: Diagnosis not present

## 2016-07-13 DIAGNOSIS — C44629 Squamous cell carcinoma of skin of left upper limb, including shoulder: Secondary | ICD-10-CM | POA: Diagnosis not present

## 2016-07-29 ENCOUNTER — Emergency Department (HOSPITAL_COMMUNITY): Payer: Commercial Managed Care - HMO

## 2016-07-29 ENCOUNTER — Encounter (HOSPITAL_COMMUNITY): Payer: Self-pay | Admitting: *Deleted

## 2016-07-29 ENCOUNTER — Inpatient Hospital Stay (HOSPITAL_COMMUNITY)
Admission: EM | Admit: 2016-07-29 | Discharge: 2016-07-30 | DRG: 291 | Disposition: A | Discharge status: Home Health | Payer: Commercial Managed Care - HMO | Attending: Internal Medicine | Admitting: Internal Medicine

## 2016-07-29 ENCOUNTER — Observation Stay (HOSPITAL_BASED_OUTPATIENT_CLINIC_OR_DEPARTMENT_OTHER): Payer: Commercial Managed Care - HMO

## 2016-07-29 DIAGNOSIS — I5033 Acute on chronic diastolic (congestive) heart failure: Secondary | ICD-10-CM | POA: Diagnosis present

## 2016-07-29 DIAGNOSIS — I110 Hypertensive heart disease with heart failure: Principal | ICD-10-CM | POA: Diagnosis present

## 2016-07-29 DIAGNOSIS — Z79899 Other long term (current) drug therapy: Secondary | ICD-10-CM | POA: Diagnosis not present

## 2016-07-29 DIAGNOSIS — E7800 Pure hypercholesterolemia, unspecified: Secondary | ICD-10-CM | POA: Diagnosis not present

## 2016-07-29 DIAGNOSIS — R0602 Shortness of breath: Secondary | ICD-10-CM | POA: Diagnosis not present

## 2016-07-29 DIAGNOSIS — Z91041 Radiographic dye allergy status: Secondary | ICD-10-CM

## 2016-07-29 DIAGNOSIS — Z8679 Personal history of other diseases of the circulatory system: Secondary | ICD-10-CM | POA: Diagnosis not present

## 2016-07-29 DIAGNOSIS — Z955 Presence of coronary angioplasty implant and graft: Secondary | ICD-10-CM

## 2016-07-29 DIAGNOSIS — Z86010 Personal history of colonic polyps: Secondary | ICD-10-CM

## 2016-07-29 DIAGNOSIS — Z7982 Long term (current) use of aspirin: Secondary | ICD-10-CM | POA: Diagnosis not present

## 2016-07-29 DIAGNOSIS — I252 Old myocardial infarction: Secondary | ICD-10-CM | POA: Diagnosis not present

## 2016-07-29 DIAGNOSIS — Z720 Tobacco use: Secondary | ICD-10-CM

## 2016-07-29 DIAGNOSIS — I10 Essential (primary) hypertension: Secondary | ICD-10-CM | POA: Diagnosis not present

## 2016-07-29 DIAGNOSIS — Z823 Family history of stroke: Secondary | ICD-10-CM

## 2016-07-29 DIAGNOSIS — I714 Abdominal aortic aneurysm, without rupture: Secondary | ICD-10-CM | POA: Diagnosis present

## 2016-07-29 DIAGNOSIS — E871 Hypo-osmolality and hyponatremia: Secondary | ICD-10-CM | POA: Diagnosis not present

## 2016-07-29 DIAGNOSIS — J441 Chronic obstructive pulmonary disease with (acute) exacerbation: Secondary | ICD-10-CM | POA: Diagnosis not present

## 2016-07-29 DIAGNOSIS — N400 Enlarged prostate without lower urinary tract symptoms: Secondary | ICD-10-CM | POA: Diagnosis not present

## 2016-07-29 DIAGNOSIS — Z85828 Personal history of other malignant neoplasm of skin: Secondary | ICD-10-CM | POA: Diagnosis not present

## 2016-07-29 DIAGNOSIS — I2510 Atherosclerotic heart disease of native coronary artery without angina pectoris: Secondary | ICD-10-CM | POA: Diagnosis present

## 2016-07-29 DIAGNOSIS — R05 Cough: Secondary | ICD-10-CM | POA: Diagnosis not present

## 2016-07-29 DIAGNOSIS — R0600 Dyspnea, unspecified: Secondary | ICD-10-CM

## 2016-07-29 DIAGNOSIS — F17210 Nicotine dependence, cigarettes, uncomplicated: Secondary | ICD-10-CM | POA: Diagnosis not present

## 2016-07-29 DIAGNOSIS — I452 Bifascicular block: Secondary | ICD-10-CM | POA: Diagnosis present

## 2016-07-29 DIAGNOSIS — J9601 Acute respiratory failure with hypoxia: Secondary | ICD-10-CM | POA: Diagnosis not present

## 2016-07-29 DIAGNOSIS — J9622 Acute and chronic respiratory failure with hypercapnia: Secondary | ICD-10-CM

## 2016-07-29 DIAGNOSIS — I5043 Acute on chronic combined systolic (congestive) and diastolic (congestive) heart failure: Secondary | ICD-10-CM | POA: Diagnosis present

## 2016-07-29 DIAGNOSIS — J9621 Acute and chronic respiratory failure with hypoxia: Secondary | ICD-10-CM | POA: Diagnosis present

## 2016-07-29 LAB — BASIC METABOLIC PANEL
Anion gap: 6 (ref 5–15)
BUN: 15 mg/dL (ref 6–20)
CO2: 29 mmol/L (ref 22–32)
CREATININE: 0.92 mg/dL (ref 0.61–1.24)
Calcium: 8.9 mg/dL (ref 8.9–10.3)
Chloride: 98 mmol/L — ABNORMAL LOW (ref 101–111)
GFR calc non Af Amer: >60 mL/min (ref >60)
Glucose, Bld: 154 mg/dL — ABNORMAL HIGH (ref 65–99)
Potassium: 3.6 mmol/L (ref 3.5–5.1)
SODIUM: 133 mmol/L — AB (ref 135–145)

## 2016-07-29 LAB — GLUCOSE, CAPILLARY: Glucose-Capillary: 179 mg/dL — ABNORMAL HIGH (ref 65–99)

## 2016-07-29 LAB — CBC WITH DIFFERENTIAL/PLATELET
BASOS PCT: 1 %
Basophils Absolute: 0.1 10*3/uL (ref 0.0–0.1)
Eosinophils Absolute: 0.3 10*3/uL (ref 0.0–0.7)
Eosinophils Relative: 3 %
HEMATOCRIT: 50 % (ref 39.0–52.0)
HEMOGLOBIN: 16.9 g/dL (ref 13.0–17.0)
Lymphocytes Relative: 12 %
Lymphs Abs: 1.3 10*3/uL (ref 0.7–4.0)
MCH: 31.5 pg (ref 26.0–34.0)
MCHC: 33.8 g/dL (ref 30.0–36.0)
MCV: 93.3 fL (ref 78.0–100.0)
MONOS PCT: 10 %
Monocytes Absolute: 1.1 10*3/uL — ABNORMAL HIGH (ref 0.1–1.0)
NEUTROS ABS: 8.1 10*3/uL — AB (ref 1.7–7.7)
NEUTROS PCT: 74 %
Platelets: 179 10*3/uL (ref 150–400)
RBC: 5.36 MIL/uL (ref 4.22–5.81)
RDW: 15 % (ref 11.5–15.5)
WBC: 10.8 10*3/uL — AB (ref 4.0–10.5)

## 2016-07-29 LAB — TROPONIN I
TROPONIN I: 0.12 ng/mL — AB (ref <0.03)
TROPONIN I: 0.17 ng/mL — AB (ref <0.03)
Troponin I: 0.06 ng/mL (ref <0.03)
Troponin I: 0.21 ng/mL (ref <0.03)

## 2016-07-29 LAB — ECHOCARDIOGRAM COMPLETE
Height: 72 in
Weight: 2560 oz

## 2016-07-29 LAB — BRAIN NATRIURETIC PEPTIDE: B NATRIURETIC PEPTIDE 5: 746 pg/mL — AB (ref 0.0–100.0)

## 2016-07-29 LAB — I-STAT CG4 LACTIC ACID, ED: Lactic Acid, Venous: 1.22 mmol/L (ref 0.5–1.9)

## 2016-07-29 MED ORDER — POLYETHYLENE GLYCOL 3350 17 G PO PACK: 17.0000 g | PACK | Freq: Every day | ORAL | Status: DC | PRN

## 2016-07-29 MED ORDER — SODIUM CHLORIDE 0.9% FLUSH
3.0000 mL | Freq: Two times a day (BID) | INTRAVENOUS | Status: DC
  Administered 2016-07-29 (×2): 3 mL via INTRAVENOUS

## 2016-07-29 MED ORDER — ASPIRIN 325 MG PO TABS
325.0000 mg | ORAL_TABLET | Freq: Every day | ORAL | Status: DC
  Administered 2016-07-29 – 2016-07-30 (×2): 325 mg via ORAL
  Filled 2016-07-29 (×3): qty 1

## 2016-07-29 MED ORDER — SODIUM CHLORIDE 0.9% FLUSH: 3.0000 mL | INTRAVENOUS | Status: DC | PRN

## 2016-07-29 MED ORDER — LOSARTAN POTASSIUM 50 MG PO TABS
50.0000 mg | ORAL_TABLET | Freq: Every morning | ORAL | Status: DC
  Administered 2016-07-29 – 2016-07-30 (×2): 50 mg via ORAL
  Filled 2016-07-29 (×3): qty 1

## 2016-07-29 MED ORDER — METOPROLOL TARTRATE 25 MG PO TABS
25.0000 mg | ORAL_TABLET | Freq: Two times a day (BID) | ORAL | Status: DC
  Administered 2016-07-29 (×2): 25 mg via ORAL
  Filled 2016-07-29: qty 1

## 2016-07-29 MED ORDER — FUROSEMIDE 10 MG/ML IJ SOLN
40.0000 mg | Freq: Two times a day (BID) | INTRAMUSCULAR | Status: DC
  Administered 2016-07-29 – 2016-07-30 (×2): 40 mg via INTRAVENOUS
  Filled 2016-07-29 (×2): qty 4

## 2016-07-29 MED ORDER — BISACODYL 5 MG PO TBEC: 5.0000 mg | DELAYED_RELEASE_TABLET | Freq: Every day | ORAL | Status: DC | PRN

## 2016-07-29 MED ORDER — NIACIN ER (ANTIHYPERLIPIDEMIC) 500 MG PO TBCR
500.0000 mg | EXTENDED_RELEASE_TABLET | Freq: Every day | ORAL | Status: DC
  Administered 2016-07-30: 500 mg via ORAL
  Filled 2016-07-29 (×4): qty 1

## 2016-07-29 MED ORDER — SODIUM CHLORIDE 0.9 % IV SOLN: 250.0000 mL | INTRAVENOUS | Status: DC | PRN

## 2016-07-29 MED ORDER — METHYLPREDNISOLONE SODIUM SUCC 125 MG IJ SOLR
60.0000 mg | Freq: Two times a day (BID) | INTRAMUSCULAR | Status: DC
  Administered 2016-07-29 – 2016-07-30 (×2): 60 mg via INTRAVENOUS
  Filled 2016-07-29 (×2): qty 2

## 2016-07-29 MED ORDER — NITROGLYCERIN 2 % TD OINT
0.5000 [in_us] | TOPICAL_OINTMENT | Freq: Four times a day (QID) | TRANSDERMAL | Status: DC
  Administered 2016-07-29 – 2016-07-30 (×4): 0.5 [in_us] via TOPICAL
  Filled 2016-07-29 (×4): qty 1

## 2016-07-29 MED ORDER — FERROUS SULFATE 325 (65 FE) MG PO TABS
325.0000 mg | ORAL_TABLET | Freq: Every day | ORAL | Status: DC
  Administered 2016-07-29 – 2016-07-30 (×2): 325 mg via ORAL
  Filled 2016-07-29 (×2): qty 1

## 2016-07-29 MED ORDER — PERFLUTREN LIPID MICROSPHERE
1.0000 mL | INTRAVENOUS | Status: AC | PRN
  Administered 2016-07-29: 2 mL via INTRAVENOUS
  Filled 2016-07-29: qty 10

## 2016-07-29 MED ORDER — SODIUM CHLORIDE 0.9% FLUSH: 3.0000 mL | Freq: Two times a day (BID) | INTRAVENOUS | Status: DC

## 2016-07-29 MED ORDER — LABETALOL HCL 5 MG/ML IV SOLN: 10.0000 mg | INTRAVENOUS | Status: DC | PRN

## 2016-07-29 MED ORDER — ONDANSETRON HCL 4 MG PO TABS: 4.0000 mg | ORAL_TABLET | Freq: Four times a day (QID) | ORAL | Status: DC | PRN

## 2016-07-29 MED ORDER — FINASTERIDE 5 MG PO TABS
5.0000 mg | ORAL_TABLET | Freq: Every day | ORAL | Status: DC
  Administered 2016-07-30: 5 mg via ORAL
  Filled 2016-07-29 (×4): qty 1

## 2016-07-29 MED ORDER — HYDROCODONE-ACETAMINOPHEN 5-325 MG PO TABS: 1.0000 | ORAL_TABLET | ORAL | Status: DC | PRN

## 2016-07-29 MED ORDER — ACETAMINOPHEN 650 MG RE SUPP: 650.0000 mg | Freq: Four times a day (QID) | RECTAL | Status: DC | PRN

## 2016-07-29 MED ORDER — ONDANSETRON HCL 4 MG/2ML IJ SOLN: 4.0000 mg | Freq: Four times a day (QID) | INTRAMUSCULAR | Status: DC | PRN

## 2016-07-29 MED ORDER — VITAMIN D 1000 UNITS PO TABS
1000.0000 [IU] | ORAL_TABLET | Freq: Every day | ORAL | Status: DC
  Administered 2016-07-29: 1000 [IU] via ORAL
  Filled 2016-07-29 (×2): qty 1

## 2016-07-29 MED ORDER — IPRATROPIUM-ALBUTEROL 0.5-2.5 (3) MG/3ML IN SOLN
3.0000 mL | RESPIRATORY_TRACT | Status: DC | PRN
  Administered 2016-07-29 – 2016-07-30 (×2): 3 mL via RESPIRATORY_TRACT
  Filled 2016-07-29 (×2): qty 3

## 2016-07-29 MED ORDER — ACETAMINOPHEN 325 MG PO TABS: 650.0000 mg | ORAL_TABLET | Freq: Four times a day (QID) | ORAL | Status: DC | PRN

## 2016-07-29 MED ORDER — ALBUTEROL (5 MG/ML) CONTINUOUS INHALATION SOLN
10.0000 mg/h | INHALATION_SOLUTION | Freq: Once | RESPIRATORY_TRACT | Status: AC
  Administered 2016-07-29: 10 mg/h via RESPIRATORY_TRACT
  Filled 2016-07-29: qty 20

## 2016-07-29 MED ORDER — METHYLPREDNISOLONE SODIUM SUCC 125 MG IJ SOLR
60.0000 mg | Freq: Four times a day (QID) | INTRAMUSCULAR | Status: DC
  Filled 2016-07-29: qty 2

## 2016-07-29 MED ORDER — HYDROCHLOROTHIAZIDE 12.5 MG PO CAPS: 12.5000 mg | ORAL_CAPSULE | Freq: Every day | ORAL | Status: DC

## 2016-07-29 MED ORDER — FUROSEMIDE 10 MG/ML IJ SOLN
60.0000 mg | Freq: Once | INTRAMUSCULAR | Status: AC
  Administered 2016-07-29: 60 mg via INTRAVENOUS
  Filled 2016-07-29: qty 6

## 2016-07-29 MED ORDER — ENOXAPARIN SODIUM 40 MG/0.4ML ~~LOC~~ SOLN
40.0000 mg | SUBCUTANEOUS | Status: DC
  Administered 2016-07-29 – 2016-07-30 (×2): 40 mg via SUBCUTANEOUS
  Filled 2016-07-29 (×2): qty 0.4

## 2016-07-29 MED ORDER — METHYLPREDNISOLONE SODIUM SUCC 125 MG IJ SOLR
125.0000 mg | Freq: Once | INTRAMUSCULAR | Status: AC
  Administered 2016-07-29: 125 mg via INTRAVENOUS
  Filled 2016-07-29: qty 2

## 2016-07-29 NOTE — ED Triage Notes (Signed)
Pt c/o sudden onset of sob that started during the night, reports that he has had a cough for the past few days, denies any fever, room air pulse ox 80% upon arrival to treatment room,

## 2016-07-29 NOTE — Care Management Obs Status (Signed)
[place] NOTIFICATION   Patient Details  Name: Karl Walker MRN: [id number] Date of Birth: 10/07/1932   Medicare Observation Status Notification Given:  Yes    Briant Sites, RN 07/29/2016, 2:06 PM

## 2016-07-29 NOTE — Progress Notes (Signed)
Found pt at desk, fully dressed and on the phone.  He said he was calling his wife because he needed to get home and take care of a few things.  Returned pt to his room, SpO2 91% on room air.  Walked pt in hall,  Approximately 20 ft and his SpO2 dropped to 81%.  Returned pt to his room and replaced his 4L O2 via [place] and sat is currently 97%.  Pt is on telemetry monitor and has no complaints at this time.  Family stated earlier today that pt has mild dementia.

## 2016-07-29 NOTE — ED Provider Notes (Signed)
[facility] DEPT Provider Note   CSN: [id number] Arrival date & time: 07/29/16  0042     History   Chief Complaint Chief Complaint  Patient presents with  . Shortness of Breath    HPI Karl Walker is a 82 y.o. male.  Patient presents to the emergency room for evaluation of shortness of breath. Patient reports that he has had a cough for approximately 3 days. Approximate 3 hours before arrival in the ER, however, his breathing significantly worsened. He does have a history of COPD. He used his nebulizer approximately 4 times before coming in. At arrival to the ER patient is very dyspneic and still very short of breath. He is not expressing any chest pain. He has not had any fever.      Past Medical History:  Diagnosis Date  . Abdominal aneurysm ([place])    scheduled procedure to take care of this 07/04/12 with Dr.Brabham  . Benign prostatic hyperplasia with urinary obstruction 12/09/2013  . Bladder diverticulum   . Bronchitis   . Bruises easily   . CAD (coronary artery disease) stent in 2007  . Cancer ([place])    basal cell ca on face  . Chest congestion    occasionally per pt and wife  . COPD (chronic obstructive pulmonary disease) ([place])    PT IS A SMOKER  . Dry skin   . Enlarged prostate    takes Proscar  . Hx of colonic polyps   . Hypercholesterolemia    takes Niacin nightly  . Hypertension    takes Hyzaar daily  . MI (myocardial infarction) 1996 and 2006   . Pneumonia    last time 63yrs ago  . Shortness of breath    with exertion  . Tracheal mass    s/p endoscopic resection  . Urinary frequency     Patient Active Problem List   Diagnosis Date Noted  . COPD (chronic obstructive pulmonary disease) with emphysema ([place]) 08/17/2014  . BPH (benign prostatic hypertrophy) with urinary obstruction 12/10/2013  . Benign prostatic hyperplasia with urinary obstruction 12/09/2013  . Bladder diverticulum 12/09/2013  . Hyperlipidemia 05/31/2013  . HTN (hypertension)  05/31/2013  . Tobacco abuse 05/31/2013  . Unspecified constipation 12/31/2012  . Aftercare following surgery of the circulatory system, [place] 08/04/2012  . Tracheal mass 06/12/2012  . Coronary artery disease 06/12/2012  . Abdominal aneurysm without mention of rupture 07/16/2011  . RECTAL BLEEDING 07/20/2010  . EARLY SATIETY 07/20/2010  . WEIGHT LOSS 07/20/2010  . CHANGE IN BOWELS 07/20/2010    Past Surgical History:  Procedure Laterality Date  . ABDOMINAL AORTIC ANEURYSM REPAIR  07-04-12   EVAR  . COLONOSCOPY    . COLONOSCOPY  Oct 2011   RMR: multiple colon polyps, left-sided diverticula, adenomatous, needs surveillance Oct 2014  . CORONARY ANGIOPLASTY WITH STENT PLACEMENT  2006  . CORONARY ANGIOPLASTY WITH STENT PLACEMENT  12/17/2006   Stent to the RCA  . CYSTOSCOPY N/A 12/11/2013   Procedure: CYSTOSCOPY with evacuation of clots and fulguration of bleeders. ;  Surgeon: Claybon Jabs, MD;  Location: [facility];  Service: Urology;  Laterality: N/A;  . CYSTOSCOPY W/ RETROGRADES Bilateral 12/10/2013   Procedure: CYSTOSCOPY WITH RETROGRADE PYELOGRAM;  Surgeon: [name], MD;  Location: [facility];  Service: Urology;  Laterality: Bilateral;  . ESOPHAGOGASTRODUODENOSCOPY  Oct 2011   RMR: normal esophagus, J-shaped stomach, mottled gastric mucosa, bulbar erosions: path minimal chronic gastritis  . HERNIA REPAIR     right inguinal  . NM MYOCAR  PERF WALL MOTION  06/18/2012   No ischemia  . SKIN SURGERY    . TRANSURETHRAL RESECTION OF PROSTATE N/A 12/10/2013   Procedure: TRANSURETHRAL RESECTION OF THE PROSTATE (TURP) with bladder biopsy with fulgration;  Surgeon: [name], MD;  Location: [facility];  Service: Urology;  Laterality: N/A;  . TUMOR REMOVAL  2013   esophagus  . US ECHOCARDIOGRAPHY  05/24/2009   mild AI,MR  . VIDEO BRONCHOSCOPY  06/25/2012   Procedure: VIDEO BRONCHOSCOPY;  Surgeon: Melrose Nakayama, MD;  Location: [facility];  Service: Thoracic;  Laterality: N/A;  Using Snare with biopsy        Home Medications    Prior to Admission medications   Medication Sig Start Date End Date Taking? Authorizing Provider  albuterol (PROVENTIL) (2.5 MG/3ML) 0.083% nebulizer solution Take 2.5 mg by nebulization daily as needed for wheezing or shortness of breath. And/or Congestion   Yes Historical Provider, MD  aspirin 325 MG tablet Take 325 mg by mouth daily.   Yes Historical Provider, MD  cholecalciferol (VITAMIN D) 1000 UNITS tablet Take 1,000 Units by mouth daily.   Yes Historical Provider, MD  ferrous sulfate 325 (65 FE) MG EC tablet Take 325 mg by mouth daily with breakfast.   Yes Historical Provider, MD  finasteride (PROSCAR) 5 MG tablet Take 5 mg by mouth daily.   Yes Historical Provider, MD  losartan-hydrochlorothiazide (HYZAAR) 50-12.5 MG per tablet Take 1 tablet by mouth every morning.    Yes Historical Provider, MD  niacin 500 MG tablet Take 500 mg by mouth daily.    Yes Historical Provider, MD    Family History Family History  Problem Relation Age of Onset  . Stroke Brother     Social History Social History  Substance Use Topics  . Smoking status: Current Some Day Smoker    Packs/day: 0.25    Years: 55.00    Types: Cigarettes  . Smokeless tobacco: Never Used  . Alcohol use No     Allergies   Iodine   Review of Systems Review of Systems  Respiratory: Positive for cough, shortness of breath and wheezing.   All other systems reviewed and are negative.    Physical Exam Updated Vital Signs BP 155/90   Pulse 103   Temp 97.8 F (36.6 C) (Oral)   Resp 18   Ht 6' (1.829 m)   Wt 172 lb (78 kg)   SpO2 97%   BMI 23.33 kg/m   Physical Exam  Constitutional: He is oriented to person, place, and time. He appears well-developed and well-nourished. No distress.  HENT:  Head: Normocephalic and atraumatic.  Right Ear: Hearing normal.  Left Ear: Hearing normal.  Nose: Nose normal.  Mouth/Throat: Oropharynx is clear and moist and mucous membranes are  normal.  Eyes: Conjunctivae and EOM are normal. Pupils are equal, round, and reactive to light.  Neck: Normal range of motion. Neck supple.  Cardiovascular: Regular rhythm, S1 normal and S2 normal.  Exam reveals no gallop and no friction rub.   No murmur heard. Pulmonary/Chest: Tachypnea noted. He is in respiratory distress. He has decreased breath sounds. He has wheezes. He has rhonchi. He exhibits no tenderness.  Abdominal: Soft. Normal appearance and bowel sounds are normal. There is no hepatosplenomegaly. There is no tenderness. There is no rebound, no guarding, no tenderness at McBurney's point and negative Murphy's sign. No hernia.  Musculoskeletal: Normal range of motion.  Neurological: He is alert and oriented to person, place, and time. He  has normal strength. No cranial nerve deficit or sensory deficit. Coordination normal. GCS eye subscore is 4. GCS verbal subscore is 5. GCS motor subscore is 6.  Skin: Skin is warm, dry and intact. No rash noted. No cyanosis.  Psychiatric: He has a normal mood and affect. His speech is normal and behavior is normal. Thought content normal.  Nursing note and vitals reviewed.    ED Treatments / Results  Labs (all labs ordered are listed, but only abnormal results are displayed) Labs Reviewed  CBC WITH DIFFERENTIAL/PLATELET - Abnormal; Notable for the following:       Result Value   WBC 10.8 (*)    Neutro Abs 8.1 (*)    Monocytes Absolute 1.1 (*)    All other components within normal limits  BASIC METABOLIC PANEL - Abnormal; Notable for the following:    Sodium 133 (*)    Chloride 98 (*)    Glucose, Bld 154 (*)    All other components within normal limits  TROPONIN I - Abnormal; Notable for the following:    Troponin I 0.06 (*)    All other components within normal limits  BRAIN NATRIURETIC PEPTIDE - Abnormal; Notable for the following:    B Natriuretic Peptide 746.0 (*)    All other components within normal limits  I-STAT CG4 LACTIC ACID,  ED    EKG  EKG Interpretation  Date/Time:  Sunday July 29 2016 00:59:58 EDT Ventricular Rate:  113 PR Interval:    QRS Duration: 140 QT Interval:  353 QTC Calculation: 484 R Axis:   -92 Text Interpretation:  Sinus tachycardia LAE, consider biatrial enlargement RBBB and LAFB No significant change since last tracing Confirmed by Leanard Dimaio  MD, Tayten Bergdoll [phone]) on 07/29/2016 1:12:22 AM       Radiology Dg Chest 2 View  Result Date: 07/29/2016 CLINICAL DATA:  Sudden onset shortness of breath. Cough. Decreased oxygenation. EXAM: CHEST  2 VIEW COMPARISON:  CT chest 08/16/2015.  Chest 12/18/2012. FINDINGS: Diffuse emphysematous changes in the lungs. Peribronchial thickening and interstitial fibrosis consistent with chronic bronchitis. Heart size and pulmonary vascularity are normal. No focal airspace disease or consolidation in the lungs. No blunting of costophrenic angles. No pneumothorax. Calcified and tortuous aorta. Degenerative changes in the spine. Vascular stent demonstrated in the upper abdomen. IMPRESSION: Emphysematous and chronic bronchitic changes in the lungs. No evidence of active pulmonary disease. Electronically Signed   By: Lucienne Capers M.D.   On: 07/29/2016 03:01    Procedures Procedures (including critical care time)  Medications Ordered in ED Medications  albuterol (PROVENTIL,VENTOLIN) solution continuous neb (10 mg/hr Nebulization Given 07/29/16 0139)  methylPREDNISolone sodium succinate (SOLU-MEDROL) 125 mg/2 mL injection 125 mg (125 mg Intravenous Given 07/29/16 0145)     Initial Impression / Assessment and Plan / ED Course  I have reviewed the triage vital signs and the nursing notes.  Pertinent labs & imaging results that were available during my care of the patient were reviewed by me and considered in my medical decision making (see chart for details).  Clinical Course  Patient with history of COPD, no oxygen dependent, presents to the ER with severe  dyspnea and hypoxia. Patient was in distress at arrival to the emergency department. Room oxygen saturation was 80%. Patient had decreased breath sounds and wheezing throughout. He has responded to continuous albuterol treatments, Solu-Medrol but is still on oxygen by nasal cannula. Chest x-ray does not show any evidence of pneumonia. Troponin is slightly elevated, will need to be  monitored. EKG did not suggest ischemia or infarct. He was very hypertensive at arrival, this has improved as his breathing has improved without further intervention. Patient will be admitted for further management.  Final Clinical Impressions(s) / ED Diagnoses   Final diagnoses:  COPD exacerbation [facility])    New Prescriptions New Prescriptions   No medications on file     Orpah Greek, MD 07/29/16 [phone]

## 2016-07-29 NOTE — ED Notes (Signed)
CRITICAL VALUE ALERT  Critical value received:  Troponin 0.06  Date of notification:  07/29/16  Time of notification:  0207 hrs  Critical value read back:Yes.    Nurse who received alert:  Y. Jlynn Ly, RN  Responding MD:  Dr. Betsey Holiday  Time MD responded:  0208 hrs

## 2016-07-29 NOTE — H&P (Signed)
History and Physical    Karl Walker [id number] DOB: 12/23/1931 DOA: 07/29/2016  PCP: Purvis Kilts, MD   Patient coming from: Home  Chief Complaint: Shortness of breath   HPI: Karl Walker is a 82 y.o. male with medical history significant for AAA status post endovascular repair, CAD with stent, COPD with ongoing tobacco use, and hypertension who presents to the emergency department with acute dyspnea. Patient had been in his usual state of health with no supplemental oxygen requirement, and using his albuterol nebulizer no more than once per day, until the development of a cough several days ago. Patient reports that his wife had developed URI symptoms and then he developed a cough a couple days later. There was some mild dyspnea associated with the cough, which is described as occasionally productive of scant thick sputum. Yesterday, dyspnea worsened significantly and the patient has been symptomatic at rest since that time. He was unable to sleep due to the dyspnea and, at the direction of his wife, came into the ED for evaluation of this. Patient's son at the bedside and notes that Mr. Dolecki was diaphoretic just prior to arrival. Patient denies recent fevers or chills, denies orthopnea, and denies lower extremity edema. Patient denies chest pain or palpitations. There is been no recent long distance travel.   ED Course: Upon arrival to the ED, patient is found to be afebrile, saturating 80% on room air, tachypneic to 30, tachycardic in the 110s, and hypertensive to 200/115. EKG demonstrates a sinus tachycardia with rate 113 and chronic RBBB and LAFB. Chest x-ray features of emphysema and chronic bronchitic changes but no acute cardiopulmonary disease. Chemistry panel features a mild hyponatremia and hypochloremia. CBC is notable for a mild leukocytosis to 10,800. Lactic acid is reassuring at 1.22, troponin is elevated to a value of 0.06, and BNP is elevated to 746. Patient was given  125 mg of IV Solu-Medrol and albuterol nebulized treatment in the emergency department. He enjoyed good improvement with these measures, but continued to require supplemental oxygen in order to maintain adequate saturations while at rest. Patient will be observed on the telemetry unit for ongoing evaluation and management of acute hypoxic respiratory failure suspected secondary to exacerbation and COPD, but with a cardiac etiology not yet excluded.  Review of Systems:  All other systems reviewed and apart from HPI, are negative.  Past Medical History:  Diagnosis Date  . Abdominal aneurysm ([place])    scheduled procedure to take care of this 07/04/12 with Dr.Brabham  . Benign prostatic hyperplasia with urinary obstruction 12/09/2013  . Bladder diverticulum   . Bronchitis   . Bruises easily   . CAD (coronary artery disease) stent in 2007  . Cancer ([place])    basal cell ca on face  . Chest congestion    occasionally per pt and wife  . COPD (chronic obstructive pulmonary disease) ([place])    PT IS A SMOKER  . Dry skin   . Enlarged prostate    takes Proscar  . Hx of colonic polyps   . Hypercholesterolemia    takes Niacin nightly  . Hypertension    takes Hyzaar daily  . MI (myocardial infarction) 1996 and 2006   . Pneumonia    last time 87yrs ago  . Shortness of breath    with exertion  . Tracheal mass    s/p endoscopic resection  . Urinary frequency     Past Surgical History:  Procedure Laterality Date  . ABDOMINAL AORTIC  ANEURYSM REPAIR  07-04-12   EVAR  . COLONOSCOPY    . COLONOSCOPY  Oct 2011   RMR: multiple colon polyps, left-sided diverticula, adenomatous, needs surveillance Oct 2014  . CORONARY ANGIOPLASTY WITH STENT PLACEMENT  2006  . CORONARY ANGIOPLASTY WITH STENT PLACEMENT  12/17/2006   Stent to the RCA  . CYSTOSCOPY N/A 12/11/2013   Procedure: CYSTOSCOPY with evacuation of clots and fulguration of bleeders. ;  Surgeon: Claybon Jabs, MD;  [facility];  Service: Urology;   Laterality: N/A;  . CYSTOSCOPY W/ RETROGRADES Bilateral 12/10/2013   Procedure: CYSTOSCOPY WITH RETROGRADE PYELOGRAM;  Surgeon: [name], MD;  [facility];  Service: Urology;  Laterality: Bilateral;  . ESOPHAGOGASTRODUODENOSCOPY  Oct 2011   RMR: normal esophagus, J-shaped stomach, mottled gastric mucosa, bulbar erosions: path minimal chronic gastritis  . HERNIA REPAIR     right inguinal  . NM MYOCAR PERF WALL MOTION  06/18/2012   No ischemia  . SKIN SURGERY    . TRANSURETHRAL RESECTION OF PROSTATE N/A 12/10/2013   Procedure: TRANSURETHRAL RESECTION OF THE PROSTATE (TURP) with bladder biopsy with fulgration;  Surgeon: [name], MD;  [facility];  Service: Urology;  Laterality: N/A;  . TUMOR REMOVAL  2013   esophagus  . US ECHOCARDIOGRAPHY  05/24/2009   mild AI,MR  . VIDEO BRONCHOSCOPY  06/25/2012   Procedure: VIDEO BRONCHOSCOPY;  Surgeon: Melrose Nakayama, MD;  Location: [facility];  Service: Thoracic;  Laterality: N/A;  Using Snare with biopsy     reports that he has been smoking Cigarettes.  He has a 13.75 pack-year smoking history. He has never used smokeless tobacco. He reports that he does not drink alcohol or use drugs.  Allergies  Allergen Reactions  . Iodine Other (See Comments)    Per pt, issue with shrimp x 20 years ago, ok with CT contrast since    Family History  Problem Relation Age of Onset  . Stroke Brother      Prior to Admission medications   Medication Sig Start Date End Date Taking? Authorizing Provider  albuterol (PROVENTIL) (2.5 MG/3ML) 0.083% nebulizer solution Take 2.5 mg by nebulization daily as needed for wheezing or shortness of breath. And/or Congestion   Yes Historical Provider, MD  aspirin 325 MG tablet Take 325 mg by mouth daily.   Yes Historical Provider, MD  cholecalciferol (VITAMIN D) 1000 UNITS tablet Take 1,000 Units by mouth daily.   Yes Historical Provider, MD  ferrous sulfate 325 (65 FE) MG EC tablet Take 325 mg by mouth daily with  breakfast.   Yes Historical Provider, MD  finasteride (PROSCAR) 5 MG tablet Take 5 mg by mouth daily.   Yes Historical Provider, MD  losartan-hydrochlorothiazide (HYZAAR) 50-12.5 MG per tablet Take 1 tablet by mouth every morning.    Yes Historical Provider, MD  niacin 500 MG tablet Take 500 mg by mouth daily.    Yes Historical Provider, MD    Physical Exam: Vitals:   07/29/16 0215 07/29/16 0230 07/29/16 0345 07/29/16 0400  BP:  155/90  138/83  Pulse: 103 103 97 94  Resp:  18 20 20   Temp:      TempSrc:      SpO2: 97% 97% 96% 96%  Weight:      Height:          Constitutional: In respiratory distress with tachypnea and accessory muscle recruitment. No pallor.  Eyes: PERTLA, lids and conjunctivae normal ENMT: Mucous membranes are moist. Posterior pharynx clear  of any exudate or lesions.   Neck: normal, supple, no masses, no thyromegaly Respiratory: Mildly diminished b/l with wheezing throughout. Increased WOB.    Cardiovascular: S1 & S2 heard, regular rate and rhythm. No extremity edema. No carotid bruits.   Abdomen: No distension, no tenderness, no masses palpated. Bowel sounds normal.  Musculoskeletal: no clubbing / cyanosis. No joint deformity upper and lower extremities. Normal muscle tone.  Skin: no significant rashes, lesions, ulcers. Warm, dry, well-perfused. Neurologic: CN 2-12 grossly intact. Sensation intact, DTR normal. Strength 5/5 in all 4 limbs.  Psychiatric: Normal judgment and insight. Alert and oriented x 3. Normal mood and affect.     Labs on Admission: I have personally reviewed following labs and imaging studies  CBC:  Recent Labs Lab 07/29/16 0126  WBC 10.8*  NEUTROABS 8.1*  HGB 16.9  HCT 50.0  MCV 93.3  PLT 0000000   Basic Metabolic Panel:  Recent Labs Lab 07/29/16 0126  NA 133*  K 3.6  CL 98*  CO2 29  GLUCOSE 154*  BUN 15  CREATININE 0.92  CALCIUM 8.9   GFR: Estimated Creatinine Clearance: 67.9 mL/min (by C-G formula based on SCr of 0.92  mg/dL). Liver Function Tests: No results for input(s): AST, ALT, ALKPHOS, BILITOT, PROT, ALBUMIN in the last 168 hours. No results for input(s): LIPASE, AMYLASE in the last 168 hours. No results for input(s): AMMONIA in the last 168 hours. Coagulation Profile: No results for input(s): INR, PROTIME in the last 168 hours. Cardiac Enzymes:  Recent Labs Lab 07/29/16 0126  TROPONINI 0.06*   BNP (last 3 results) No results for input(s): PROBNP in the last 8760 hours. HbA1C: No results for input(s): HGBA1C in the last 72 hours. CBG: No results for input(s): GLUCAP in the last 168 hours. Lipid Profile: No results for input(s): CHOL, HDL, LDLCALC, TRIG, CHOLHDL, LDLDIRECT in the last 72 hours. Thyroid Function Tests: No results for input(s): TSH, T4TOTAL, FREET4, T3FREE, THYROIDAB in the last 72 hours. Anemia Panel: No results for input(s): VITAMINB12, FOLATE, FERRITIN, TIBC, IRON, RETICCTPCT in the last 72 hours. Urine analysis:    Component Value Date/Time   COLORURINE YELLOW 12/18/2012 1722   APPEARANCEUR CLEAR 12/18/2012 1722   LABSPEC 1.025 12/18/2012 1722   PHURINE 6.0 12/18/2012 1722   GLUCOSEU NEGATIVE 12/18/2012 1722   HGBUR NEGATIVE 12/18/2012 1722   BILIRUBINUR NEGATIVE 12/18/2012 1722   KETONESUR TRACE (A) 12/18/2012 1722   PROTEINUR NEGATIVE 12/18/2012 1722   UROBILINOGEN 0.2 12/18/2012 1722   NITRITE NEGATIVE 12/18/2012 1722   LEUKOCYTESUR SMALL (A) 12/18/2012 1722   Sepsis Labs: @LABRCNTIP (procalcitonin:4,lacticidven:4) )No results found for this or any previous visit (from the past 240 hour(s)).   Radiological Exams on Admission: Dg Chest 2 View  Result Date: 07/29/2016 CLINICAL DATA:  Sudden onset shortness of breath. Cough. Decreased oxygenation. EXAM: CHEST  2 VIEW COMPARISON:  CT chest 08/16/2015.  Chest 12/18/2012. FINDINGS: Diffuse emphysematous changes in the lungs. Peribronchial thickening and interstitial fibrosis consistent with chronic bronchitis.  Heart size and pulmonary vascularity are normal. No focal airspace disease or consolidation in the lungs. No blunting of costophrenic angles. No pneumothorax. Calcified and tortuous aorta. Degenerative changes in the spine. Vascular stent demonstrated in the upper abdomen. IMPRESSION: Emphysematous and chronic bronchitic changes in the lungs. No evidence of active pulmonary disease. Electronically Signed   By: Lucienne Capers M.D.   On: 07/29/2016 03:01    EKG: Independently reviewed. Sinus tachycardia (rate 113), RBBB, LAFB, no significant change from Feb 2013  Assessment/Plan  1. Acute hypoxic respiratory failure  - Does not require supplemental O2 at baseline, but presents with sat of 80% on rm air  - CXR marked by chronic emphysematous and bronchitic changes, but no acute infiltrate or edema noted  - Primary problem appears to be acute exacerbation in COPD, possibly precipitated by viral URI, but cardiac etiology not yet excluded  - BNP elevated to 746, but no peripheral edema, gallop, or significant JVD  - Plan to treat COPD exacerbation as below while trending troponin, monitoring on telemetry for ischemic changes, and obtaining TTE for EF and wall-motion  2. COPD with acute exacerbation  - At baseline, pt does not require supplemental O2 and uses albuterol ~once/day  - Presents with several days of cough, exposure to wife with acute URI, and now acute dyspnea and hypoxic respiratory failure  - No infiltrate on CXR, no fever - Improved in ED with albuterol neb and IV Solu-Medrol  - Continue neb txs, systemic steroid  - Monitor with continuous pulse oximetry and wean supplemental O2 as tolerated   3. CAD, elevated troponin   - Hx of NSTEMI in 2008 treated with DES to RCA - Nuclear medicine stress test in 2013 without evidence for ischemia  - No CP or palpitations; pt presented with dyspnea and son reports diaphoresis just PTA  - Trop elevated to 0.06 in ED  - Suspect the dyspnea and  diaphoresis were related to aeCOPD, but there is still some concern (albeit low) for MI - Plan to monitor on telemetry for ischemic changes, trend troponin, repeat EKG in am, and check TTE  - Continue daily ASA 325; started Lopressor at time of admission d/t hypertension with concern for possible ACS; statin previously discontinued d/t intolerance   4. Hypertension  - BP elevated to 200/114 on presentation, normalized spontaneously while still in ED  - Managed with losartan-HCTZ at home, will continue as tolerated  - Lopressor 25 mg BID started for HTN with elevated troponin; can likely be discontinued if there is no significant increase in troponin or new wall-motion abnormality on echo   5. Current smoker  - Counseled toward cessation  - Nicotine patch offered  - RN asked to provide smoking-cessation information prior to discharge    DVT prophylaxis: sq Lovenox  Code Status: Full  Family Communication: Son updated at bedside Disposition Plan: Observe on telemetry Consults called: None Admission status: Observation     Vianne Bulls, MD [facility] Pager [phone]  If 7PM-7AM, please contact night-coverage [email or web address] Password [email or web address]  07/29/2016, 4:06 AM

## 2016-07-29 NOTE — Progress Notes (Signed)
  Echocardiogram 2D Echocardiogram with Definity has been performed.  [name] 07/29/2016, 12:05 PM

## 2016-07-29 NOTE — Progress Notes (Signed)
PROGRESS NOTE                                                                                                                                                                                                             Patient Demographics:    Karl Walker, is a 82 y.o. male, DOB - 1932/02/21, [id number]  Admit date - 07/29/2016   Admitting Physician Vianne Bulls, MD  Outpatient Primary MD for the patient is Karl Kilts, MD  LOS - 0  Chief Complaint  Patient presents with  . Shortness of Breath       Brief Narrative  Karl Walker is a 82 y.o. male with medical history significant for AAA status post endovascular repair, CAD with stent, COPD with ongoing tobacco use, and hypertension who presents to the emergency department with acute dyspnea. Patient had been in his usual state of health with no supplemental oxygen requirement, and using his albuterol nebulizer no more than once per day, until the development of a cough several days ago.  Was admitted for acute on chronic diastolic CHF with possibly mild COPD exacerbation.   Subjective:    Karl Walker today has, No headache, No chest pain, No abdominal pain - No Nausea, No new weakness tingling or numbness, Improved  Cough & SOB.     Assessment  & Plan :     1.Acute hypoxic respiratory failure due to acute on chronic diastolic CHF plus possibly mild COPD exacerbation.  Patient will be treated with IV Lasix, Nitropaste, continue beta blocker at home dose, salt and fluid restriction, daily weight, intake output monitoring, repeat echocardiogram pending. Last EF was preserved around 55%.  Mild COPD exacerbation treatment as above along with gentle IV Solu-Medrol, oxygen and nebulizer treatments. Added Flutter valve.  2. CAD with mild troponin rise and on ACS pattern due to #1 above. EKG is unchanged from previous, he has no chest pain, on aspirin & beta  blocker . Repeat echo to evaluate wall motion and EF.  3.HTN - on B Blocker, ARB and Lasix.  4.Smoker - counseled to quit.    Family Communication  :  None  Code Status :  Full  Diet : Heart Healthy  Disposition Plan  :  Home 1-2 days  Consults  :  None  Procedures  :  TTE  DVT Prophylaxis  :  Lovenox   Lab Results  Component Value Date   PLT 179 07/29/2016    Inpatient Medications  Scheduled Meds: . aspirin  325 mg Oral Daily  . cholecalciferol  1,000 Units Oral Daily  . enoxaparin (LOVENOX) injection  40 mg Subcutaneous Q24H  . ferrous sulfate  325 mg Oral Q breakfast  . finasteride  5 mg Oral Daily  . furosemide  40 mg Intravenous BID  . furosemide  60 mg Intravenous Once  . losartan  50 mg Oral q morning - 10a  . methylPREDNISolone (SOLU-MEDROL) injection  60 mg Intravenous Q6H  . metoprolol tartrate  25 mg Oral BID  . niacin  500 mg Oral Daily  . nitroGLYCERIN  0.5 inch Topical Q6H  . sodium chloride flush  3 mL Intravenous Q12H   Continuous Infusions:  PRN Meds:.acetaminophen **OR** acetaminophen, bisacodyl, HYDROcodone-acetaminophen, ipratropium-albuterol, labetalol, [DISCONTINUED] ondansetron **OR** ondansetron (ZOFRAN) IV, polyethylene glycol  Antibiotics  :    Anti-infectives    None         Objective:   Vitals:   07/29/16 0415 07/29/16 0430 07/29/16 0512 07/29/16 0804  BP:  150/65 (!) 168/90   Pulse: 83 89 96   Resp:  25 (!) 23   Temp:   98.6 F (37 C)   TempSrc:   Oral   SpO2:  96% 99% 93%  Weight:   72.6 kg (160 lb)   Height:   6' (1.829 m)     Wt Readings from Last 3 Encounters:  07/29/16 72.6 kg (160 lb)  11/07/15 70.8 kg (156 lb)  08/16/15 70.3 kg (155 lb)    No intake or output data in the 24 hours ending 07/29/16 1120   Physical Exam  Awake Alert, Oriented X 3, No new F.N deficits, Normal affect [place].AT,PERRAL Supple Neck,No JVD, No cervical lymphadenopathy appriciated.  Symmetrical Chest wall movement, Good air  movement bilaterally, +ve rales RRR,No Gallops,Rubs or new Murmurs, No Parasternal Heave +ve B.Sounds, Abd Soft, No tenderness, No organomegaly appriciated, No rebound - guarding or rigidity. No Cyanosis, Clubbing or edema, No new Rash or bruise       Data Review:    CBC  Recent Labs Lab 07/29/16 0126  WBC 10.8*  HGB 16.9  HCT 50.0  PLT 179  MCV 93.3  MCH 31.5  MCHC 33.8  RDW 15.0  LYMPHSABS 1.3  MONOABS 1.1*  EOSABS 0.3  BASOSABS 0.1    Chemistries   Recent Labs Lab 07/29/16 0126  NA 133*  K 3.6  CL 98*  CO2 29  GLUCOSE 154*  BUN 15  CREATININE 0.92  CALCIUM 8.9   ------------------------------------------------------------------------------------------------------------------ No results for input(s): CHOL, HDL, LDLCALC, TRIG, CHOLHDL, LDLDIRECT in the last 72 hours.  No results found for: HGBA1C ------------------------------------------------------------------------------------------------------------------ No results for input(s): TSH, T4TOTAL, T3FREE, THYROIDAB in the last 72 hours.  Invalid input(s): FREET3 ------------------------------------------------------------------------------------------------------------------ No results for input(s): VITAMINB12, FOLATE, FERRITIN, TIBC, IRON, RETICCTPCT in the last 72 hours.  Coagulation profile No results for input(s): INR, PROTIME in the last 168 hours.  No results for input(s): DDIMER in the last 72 hours.  Cardiac Enzymes  Recent Labs Lab 07/29/16 0126 07/29/16 0415  TROPONINI 0.06* 0.12*   ------------------------------------------------------------------------------------------------------------------    Component Value Date/Time   BNP 746.0 (H) 07/29/2016 0126    Micro Results No results found for this or any previous visit (from the past 240 hour(s)).  Radiology Reports Dg Chest 2 View  Result Date:  07/29/2016 CLINICAL DATA:  Sudden onset shortness of breath. Cough. Decreased  oxygenation. EXAM: CHEST  2 VIEW COMPARISON:  CT chest 08/16/2015.  Chest 12/18/2012. FINDINGS: Diffuse emphysematous changes in the lungs. Peribronchial thickening and interstitial fibrosis consistent with chronic bronchitis. Heart size and pulmonary vascularity are normal. No focal airspace disease or consolidation in the lungs. No blunting of costophrenic angles. No pneumothorax. Calcified and tortuous aorta. Degenerative changes in the spine. Vascular stent demonstrated in the upper abdomen. IMPRESSION: Emphysematous and chronic bronchitic changes in the lungs. No evidence of active pulmonary disease. Electronically Signed   By: Lucienne Capers M.D.   On: 07/29/2016 03:01    Time Spent in minutes  30   Miu Chiong K M.D on 07/29/2016 at 11:20 AM  Between 7am to 7pm - Pager - [phone]  After 7pm go to [email or web address] - password [facility]  [facility] -  Office  [phone]

## 2016-07-30 LAB — BASIC METABOLIC PANEL
Anion gap: 7 (ref 5–15)
BUN: 33 mg/dL — ABNORMAL HIGH (ref 6–20)
CALCIUM: 9 mg/dL (ref 8.9–10.3)
CHLORIDE: 96 mmol/L — AB (ref 101–111)
CO2: 33 mmol/L — ABNORMAL HIGH (ref 22–32)
CREATININE: 1.15 mg/dL (ref 0.61–1.24)
GFR calc non Af Amer: 57 mL/min — ABNORMAL LOW (ref >60)
Glucose, Bld: 146 mg/dL — ABNORMAL HIGH (ref 65–99)
Potassium: 4.2 mmol/L (ref 3.5–5.1)
SODIUM: 136 mmol/L (ref 135–145)

## 2016-07-30 LAB — MAGNESIUM: MAGNESIUM: 1.9 mg/dL (ref 1.7–2.4)

## 2016-07-30 LAB — GLUCOSE, CAPILLARY: Glucose-Capillary: 211 mg/dL — ABNORMAL HIGH (ref 65–99)

## 2016-07-30 MED ORDER — SPIRONOLACTONE 25 MG PO TABS: 12.5000 mg | ORAL_TABLET | Freq: Every day | ORAL | 0 refills | Status: AC

## 2016-07-30 MED ORDER — CARVEDILOL 3.125 MG PO TABS
3.1250 mg | ORAL_TABLET | Freq: Two times a day (BID) | ORAL | Status: DC
  Administered 2016-07-30: 3.125 mg via ORAL
  Filled 2016-07-30: qty 1

## 2016-07-30 MED ORDER — CARVEDILOL 3.125 MG PO TABS: 3.1250 mg | ORAL_TABLET | Freq: Two times a day (BID) | ORAL | 0 refills | Status: AC

## 2016-07-30 MED ORDER — POTASSIUM CHLORIDE CRYS ER 20 MEQ PO TBCR
40.0000 meq | EXTENDED_RELEASE_TABLET | Freq: Once | ORAL | Status: AC
  Administered 2016-07-30: 40 meq via ORAL
  Filled 2016-07-30: qty 2

## 2016-07-30 MED ORDER — FUROSEMIDE 40 MG PO TABS: 40.0000 mg | ORAL_TABLET | Freq: Two times a day (BID) | ORAL | 0 refills | Status: AC

## 2016-07-30 MED ORDER — LOSARTAN POTASSIUM 25 MG PO TABS: 25.0000 mg | ORAL_TABLET | Freq: Every day | ORAL | 0 refills | Status: AC

## 2016-07-30 MED ORDER — ISOSORBIDE MONONITRATE ER 30 MG PO TB24: 30.0000 mg | ORAL_TABLET | Freq: Every day | ORAL | 0 refills | Status: AC

## 2016-07-30 NOTE — Care Management Important Message (Signed)
Important Message  Patient Details  Name: PIERCEN EHRSAM MRN: [id number] Date of Birth: [birth year]   Medicare Important Message Given:  Yes    Ronetta Molla, Chauncey Reading, RN 07/30/2016, 10:45 AM

## 2016-07-30 NOTE — Discharge Instructions (Signed)
Follow with Primary MD Purvis Kilts, MD in 7 days   Get CBC, CMP, 2 view Chest X ray checked  by Primary MD  in 5-7 days ( we routinely change or add medications that can affect your baseline labs and fluid status, therefore we recommend that you get the mentioned basic workup next visit with your PCP, your PCP may decide not to get them or add new tests based on their clinical decision)   Activity: As tolerated with Full fall precautions use walker/cane & assistance as needed   Disposition Home    Diet:   Heart Healthy Check your Weight same time everyday, if you gain over 2 pounds, or you develop in leg swelling, experience more shortness of breath or chest pain, call your Primary MD immediately. Follow Cardiac Low Salt Diet and 1.5 lit/day fluid restriction.   On your next visit with your primary care physician please Get Medicines reviewed and adjusted.   Please request your Prim.MD to go over all Hospital Tests and Procedure/Radiological results at the follow up, please get all Hospital records sent to your Prim MD by signing hospital release before you go home.   If you experience worsening of your admission symptoms, develop shortness of breath, life threatening emergency, suicidal or homicidal thoughts you must seek medical attention immediately by calling 911 or calling your MD immediately  if symptoms less severe.  You Must read complete instructions/literature along with all the possible adverse reactions/side effects for all the Medicines you take and that have been prescribed to you. Take any new Medicines after you have completely understood and accpet all the possible adverse reactions/side effects.   Do not drive, operate heavy machinery, perform activities at heights, swimming or participation in water activities or provide baby sitting services if your were admitted for syncope or siezures until you have seen by Primary MD or a Neurologist and advised to do so  again.  Do not drive when taking Pain medications.    Do not take more than prescribed Pain, Sleep and Anxiety Medications  Special Instructions: If you have smoked or chewed Tobacco  in the last 2 yrs please stop smoking, stop any regular Alcohol  and or any Recreational drug use.  Wear Seat belts while driving.   Please note  You were cared for by a hospitalist during your hospital stay. If you have any questions about your discharge medications or the care you received while you were in the hospital after you are discharged, you can call the unit and asked to speak with the hospitalist on call if the hospitalist that took care of you is not available. Once you are discharged, your primary care physician will handle any further medical issues. Please note that NO REFILLS for any discharge medications will be authorized once you are discharged, as it is imperative that you return to your primary care physician (or establish a relationship with a primary care physician if you do not have one) for your aftercare needs so that they can reassess your need for medications and monitor your lab values.

## 2016-07-30 NOTE — Progress Notes (Signed)
Pt discharged in stable condition into the care of his family via wheelchair into private vehicle.  PIV removed intact w/o S&S of complications.  Discharge instructions reviewed with pt/family.  Pt/family verbalized understanding.

## 2016-07-30 NOTE — Progress Notes (Signed)
SATURATION QUALIFICATIONS: (This note is used to comply with regulatory documentation for home oxygen)  Patient Saturations on Room Air at Rest = 89%  Patient Saturations on Room Air while Ambulating = 88%  Patient Saturations on 3 Liters of oxygen while Ambulating = 93%  Please briefly explain why patient needs home oxygen:

## 2016-07-30 NOTE — Discharge Summary (Signed)
Karl Walker [id number] DOB: 06/22/1932 DOA: 07/29/2016  PCP: Purvis Kilts, MD  Admit date: 07/29/2016  Discharge date: 07/30/2016  Admitted From: Home   Disposition:  Home   Recommendations for Outpatient Follow-up:   Follow up with PCP in 1-2 weeks  PCP Please obtain BMP/CBC, 2 view CXR in 1week,  (see Discharge instructions)   PCP Please follow up on the following pending results: None   Home Health: PT,RN   Equipment/Devices: None  Consultations: Cards over the phone Discharge Condition: Stable   CODE STATUS: Full   Diet Recommendation: Heart Healthy with 1.5 L total fluid restriction per day   Chief Complaint  Patient presents with  . Shortness of Breath     Brief history of present illness from the day of admission and additional interim summary    Karl Walker a 82 y.o.malewith medical history significant for AAA status post endovascular repair, CAD with stent, COPD with ongoing tobacco use, and hypertension who presents to the emergency department with acute dyspnea. Patient had been in his usual state of health with no supplemental oxygen requirement, and using his albuterol nebulizer no more than once per day, until the development of a cough several days ago.  Was admitted for acute on chronic diastolic CHF with possibly mild COPD exacerbation.  Hospital issues addressed    1.Acute hypoxic respiratory failure due to acute on chronic diastolic CHF plus possibly mild COPD exacerbation.  Patient was treated with IV Lasix, Nitropaste, continue beta blocker at home dose, salt and fluid restriction, now completely symptom-free and eager to go home, did qualify for home oxygen after ambulating on room air and will get 2 L nasal cannula oxygen, repeat echocardiogram showed an EF of  35% with positive wall motion abnormalities, had mild troponin rise in non-ACS pattern, discussed his case with cardiologist on call Dr. Bronson Ing, commended outpatient follow-up, patient will be placed on Lasix, Aldactone, Coreg, Imdur, ARB with outpatient follow-up with PCP and cardiology. Request PCP to check weight, BMP and fluid status in the next 3-4 days and adjust medications as needed.  Mild COPD exacerbation treatment as above completely resolved wheezing, stop any further steroids, continue home nebulizer treatments. Counseled to quit smoking.  2. CAD with mild troponin rise and on ACS pattern due to #1 above. EKG is unchanged from previous, he has no chest pain, on aspirin & beta blocker . Repeat echo as above follow with cardiology outpatient.  3.HTN - on B Blocker, ARB and Lasix.  4.Smoker - counseled to quit.   Discharge diagnosis     Principal Problem:   COPD exacerbation ([place]) Active Problems:   Coronary artery disease   HTN (hypertension)   Tobacco abuse   Hyponatremia   Acute respiratory failure with hypoxia [facility])    Discharge instructions    Discharge Instructions    Discharge instructions    Complete by:  As directed    Follow with Primary MD Purvis Kilts, MD in 7 days   Get CBC, CMP,  2 view Chest X ray checked  by Primary MD  in 5-7 days ( we routinely change or add medications that can affect your baseline labs and fluid status, therefore we recommend that you get the mentioned basic workup next visit with your PCP, your PCP may decide not to get them or add new tests based on their clinical decision)   Activity: As tolerated with Full fall precautions use walker/cane & assistance as needed   Disposition Home    Diet:   Heart Healthy Check your Weight same time everyday, if you gain over 2 pounds, or you develop in leg swelling, experience more shortness of breath or chest pain, call your Primary MD immediately. Follow Cardiac Low Salt Diet  and 1.5 lit/day fluid restriction.   On your next visit with your primary care physician please Get Medicines reviewed and adjusted.   Please request your Prim.MD to go over all Hospital Tests and Procedure/Radiological results at the follow up, please get all Hospital records sent to your Prim MD by signing hospital release before you go home.   If you experience worsening of your admission symptoms, develop shortness of breath, life threatening emergency, suicidal or homicidal thoughts you must seek medical attention immediately by calling 911 or calling your MD immediately  if symptoms less severe.  You Must read complete instructions/literature along with all the possible adverse reactions/side effects for all the Medicines you take and that have been prescribed to you. Take any new Medicines after you have completely understood and accpet all the possible adverse reactions/side effects.   Do not drive, operate heavy machinery, perform activities at heights, swimming or participation in water activities or provide baby sitting services if your were admitted for syncope or siezures until you have seen by Primary MD or a Neurologist and advised to do so again.  Do not drive when taking Pain medications.    Do not take more than prescribed Pain, Sleep and Anxiety Medications  Special Instructions: If you have smoked or chewed Tobacco  in the last 2 yrs please stop smoking, stop any regular Alcohol  and or any Recreational drug use.  Wear Seat belts while driving.   Please note  You were cared for by a hospitalist during your hospital stay. If you have any questions about your discharge medications or the care you received while you were in the hospital after you are discharged, you can call the unit and asked to speak with the hospitalist on call if the hospitalist that took care of you is not available. Once you are discharged, your primary care physician will handle any further medical  issues. Please note that NO REFILLS for any discharge medications will be authorized once you are discharged, as it is imperative that you return to your primary care physician (or establish a relationship with a primary care physician if you do not have one) for your aftercare needs so that they can reassess your need for medications and monitor your lab values.   Increase activity slowly    Complete by:  As directed       Discharge Medications     Medication List    STOP taking these medications   losartan-hydrochlorothiazide 50-12.5 MG tablet Commonly known as:  HYZAAR     TAKE these medications   albuterol (2.5 MG/3ML) 0.083% nebulizer solution Commonly known as:  PROVENTIL Take 2.5 mg by nebulization daily as needed for wheezing or shortness of breath. And/or Congestion   aspirin 325  MG tablet Take 325 mg by mouth daily.   carvedilol 3.125 MG tablet Commonly known as:  COREG Take 1 tablet (3.125 mg total) by mouth 2 (two) times daily with a meal.   cholecalciferol 1000 units tablet Commonly known as:  VITAMIN D Take 1,000 Units by mouth daily.   ferrous sulfate 325 (65 FE) MG EC tablet Take 325 mg by mouth daily with breakfast.   finasteride 5 MG tablet Commonly known as:  PROSCAR Take 5 mg by mouth daily.   furosemide 40 MG tablet Commonly known as:  LASIX Take 1 tablet (40 mg total) by mouth 2 (two) times daily.   isosorbide mononitrate 30 MG 24 hr tablet Commonly known as:  IMDUR Take 1 tablet (30 mg total) by mouth daily.   losartan 25 MG tablet Commonly known as:  COZAAR Take 1 tablet (25 mg total) by mouth daily.   niacin 500 MG tablet Take 500 mg by mouth daily.   spironolactone 25 MG tablet Commonly known as:  ALDACTONE Take 0.5 tablets (12.5 mg total) by mouth daily.       Follow-up Information    Purvis Kilts, MD. Schedule an appointment as soon as possible for a visit in 1 week(s).   Specialty:  Family Medicine Contact  information: [address] [place] [id number] [phone]        Kate Sable, MD. Schedule an appointment as soon as possible for a visit in 1 week(s).   Specialty:  Cardiology Contact information: [place] [address] [phone]           Major procedures and Radiology Reports - PLEASE review detailed and final reports thoroughly  -        Dg Chest 2 View  Result Date: 07/29/2016 CLINICAL DATA:  Sudden onset shortness of breath. Cough. Decreased oxygenation. EXAM: CHEST  2 VIEW COMPARISON:  CT chest 08/16/2015.  Chest 12/18/2012. FINDINGS: Diffuse emphysematous changes in the lungs. Peribronchial thickening and interstitial fibrosis consistent with chronic bronchitis. Heart size and pulmonary vascularity are normal. No focal airspace disease or consolidation in the lungs. No blunting of costophrenic angles. No pneumothorax. Calcified and tortuous aorta. Degenerative changes in the spine. Vascular stent demonstrated in the upper abdomen. IMPRESSION: Emphysematous and chronic bronchitic changes in the lungs. No evidence of active pulmonary disease. Electronically Signed   By: Lucienne Capers M.D.   On: 07/29/2016 03:01    Micro Results    No results found for this or any previous visit (from the past 240 hour(s)).  Today   Subjective    Karl Walker today has no headache,no chest abdominal pain,no new weakness tingling or numbness, feels much better wants to go home today.     Objective   Blood pressure 119/66, pulse 87, temperature 98.4 F (36.9 C), temperature source Oral, resp. rate 20, height 5\' 11"  (1.803 m), weight 66.6 kg (146 lb 12.8 oz), SpO2 96 %.   Intake/Output Summary (Last 24 hours) at 07/30/16 0921 Last data filed at 07/30/16 0528  Gross per 24 hour  Intake                0 ml  Output                1 ml  Net               -1 ml    Exam Awake Alert, Oriented x 3, No new F.N deficits, Normal  affect  [place].AT,PERRAL Supple Neck,No JVD, No cervical lymphadenopathy appriciated.  Symmetrical Chest wall movement, Good air movement bilaterally, Today no rales, no wheezing RRR,No Gallops,Rubs or new Murmurs, No Parasternal Heave +ve B.Sounds, Abd Soft, Non tender, No organomegaly appriciated, No rebound -guarding or rigidity. No Cyanosis, Clubbing or edema, No new Rash or bruise   Data Review   CBC w Diff: Lab Results  Component Value Date   WBC 10.8 (H) 07/29/2016   HGB 16.9 07/29/2016   HCT 50.0 07/29/2016   PLT 179 07/29/2016   LYMPHOPCT 12 07/29/2016   MONOPCT 10 07/29/2016   EOSPCT 3 07/29/2016   BASOPCT 1 07/29/2016    CMP: Lab Results  Component Value Date   NA 136 07/30/2016   K 4.2 07/30/2016   CL 96 (L) 07/30/2016   CO2 33 (H) 07/30/2016   BUN 33 (H) 07/30/2016   CREATININE 1.15 07/30/2016   CREATININE 1.01 06/04/2014   PROT 6.5 06/04/2014   ALBUMIN 3.8 06/04/2014   BILITOT 0.7 06/04/2014   ALKPHOS 58 06/04/2014   AST 13 06/04/2014   ALT 9 06/04/2014  .   Total Time in preparing paper work, data evaluation and todays exam - 35 minutes  Thurnell Lose M.D on 07/30/2016 at 9:21 AM  [facility]   Office  [phone]

## 2016-07-30 NOTE — Care Management Note (Signed)
Case Management Note  Patient Details  Name: Karl Walker MRN: [id number] Date of Birth: [birth year]  Subjective/Objective:   Patient adm from home with COPD exacerbation. He lives with his wife, and is ind with ADL's. He will need oxygen to take home at discharge and would like [facility] RN and PT as well. Offered choice of [place] agencies.                Action/Plan: Romualdo Bolk of [facility] notified and will obtain orders from chart. Patient and family aware that [facility] has 48 hours to make first visit. Oxygen will be delivered to room prior to discharge.   Expected Discharge Date:                 07/30/2016 Expected Discharge Plan:  [place]  In-House Referral:  NA  Discharge planning Services  CM Consult  Post Acute Care Choice:  Home Health Choice offered to:  Patient  DME Arranged:  Oxygen DME Agency:  [place] Arranged:  RN, PT [facility] Agency:  [place]  Status of Service:  Completed, signed off  If discussed at [place] of Stay Meetings, dates discussed:    Additional Comments:  Joleen Stuckert, Chauncey Reading, RN 07/30/2016, 10:40 AM

## 2016-07-31 DIAGNOSIS — J441 Chronic obstructive pulmonary disease with (acute) exacerbation: Secondary | ICD-10-CM | POA: Diagnosis not present

## 2016-08-01 DIAGNOSIS — J441 Chronic obstructive pulmonary disease with (acute) exacerbation: Secondary | ICD-10-CM | POA: Diagnosis not present

## 2016-08-01 DIAGNOSIS — J9601 Acute respiratory failure with hypoxia: Secondary | ICD-10-CM | POA: Diagnosis not present

## 2016-08-01 DIAGNOSIS — I252 Old myocardial infarction: Secondary | ICD-10-CM | POA: Diagnosis not present

## 2016-08-01 DIAGNOSIS — I110 Hypertensive heart disease with heart failure: Secondary | ICD-10-CM | POA: Diagnosis not present

## 2016-08-01 DIAGNOSIS — I5033 Acute on chronic diastolic (congestive) heart failure: Secondary | ICD-10-CM | POA: Diagnosis not present

## 2016-08-01 DIAGNOSIS — I2510 Atherosclerotic heart disease of native coronary artery without angina pectoris: Secondary | ICD-10-CM | POA: Diagnosis not present

## 2016-08-01 DIAGNOSIS — J449 Chronic obstructive pulmonary disease, unspecified: Secondary | ICD-10-CM | POA: Diagnosis not present

## 2016-08-01 DIAGNOSIS — Z9981 Dependence on supplemental oxygen: Secondary | ICD-10-CM | POA: Diagnosis not present

## 2016-08-01 DIAGNOSIS — E785 Hyperlipidemia, unspecified: Secondary | ICD-10-CM | POA: Diagnosis not present

## 2016-08-03 DIAGNOSIS — E785 Hyperlipidemia, unspecified: Secondary | ICD-10-CM | POA: Diagnosis not present

## 2016-08-03 DIAGNOSIS — J9601 Acute respiratory failure with hypoxia: Secondary | ICD-10-CM | POA: Diagnosis not present

## 2016-08-03 DIAGNOSIS — Z9981 Dependence on supplemental oxygen: Secondary | ICD-10-CM | POA: Diagnosis not present

## 2016-08-03 DIAGNOSIS — I2510 Atherosclerotic heart disease of native coronary artery without angina pectoris: Secondary | ICD-10-CM | POA: Diagnosis not present

## 2016-08-03 DIAGNOSIS — I5033 Acute on chronic diastolic (congestive) heart failure: Secondary | ICD-10-CM | POA: Diagnosis not present

## 2016-08-03 DIAGNOSIS — J441 Chronic obstructive pulmonary disease with (acute) exacerbation: Secondary | ICD-10-CM | POA: Diagnosis not present

## 2016-08-03 DIAGNOSIS — J449 Chronic obstructive pulmonary disease, unspecified: Secondary | ICD-10-CM | POA: Diagnosis not present

## 2016-08-03 DIAGNOSIS — I252 Old myocardial infarction: Secondary | ICD-10-CM | POA: Diagnosis not present

## 2016-08-03 DIAGNOSIS — I110 Hypertensive heart disease with heart failure: Secondary | ICD-10-CM | POA: Diagnosis not present

## 2016-08-06 ENCOUNTER — Encounter (HOSPITAL_COMMUNITY): Payer: Self-pay | Admitting: Emergency Medicine

## 2016-08-06 ENCOUNTER — Emergency Department (HOSPITAL_COMMUNITY): Payer: Commercial Managed Care - HMO

## 2016-08-06 ENCOUNTER — Inpatient Hospital Stay (HOSPITAL_COMMUNITY)
Admission: EM | Admit: 2016-08-06 | Discharge: 2016-08-08 | DRG: 190 | Disposition: A | Discharge status: Home Health | Payer: Commercial Managed Care - HMO | Attending: Internal Medicine | Admitting: Internal Medicine

## 2016-08-06 DIAGNOSIS — I5040 Unspecified combined systolic (congestive) and diastolic (congestive) heart failure: Secondary | ICD-10-CM | POA: Diagnosis not present

## 2016-08-06 DIAGNOSIS — Z5181 Encounter for therapeutic drug level monitoring: Secondary | ICD-10-CM | POA: Diagnosis not present

## 2016-08-06 DIAGNOSIS — J189 Pneumonia, unspecified organism: Secondary | ICD-10-CM | POA: Diagnosis present

## 2016-08-06 DIAGNOSIS — Z79899 Other long term (current) drug therapy: Secondary | ICD-10-CM

## 2016-08-06 DIAGNOSIS — J9621 Acute and chronic respiratory failure with hypoxia: Secondary | ICD-10-CM | POA: Diagnosis present

## 2016-08-06 DIAGNOSIS — I4510 Unspecified right bundle-branch block: Secondary | ICD-10-CM | POA: Diagnosis present

## 2016-08-06 DIAGNOSIS — E785 Hyperlipidemia, unspecified: Secondary | ICD-10-CM | POA: Diagnosis not present

## 2016-08-06 DIAGNOSIS — I2510 Atherosclerotic heart disease of native coronary artery without angina pectoris: Secondary | ICD-10-CM | POA: Diagnosis present

## 2016-08-06 DIAGNOSIS — J441 Chronic obstructive pulmonary disease with (acute) exacerbation: Secondary | ICD-10-CM | POA: Diagnosis present

## 2016-08-06 DIAGNOSIS — Z9079 Acquired absence of other genital organ(s): Secondary | ICD-10-CM

## 2016-08-06 DIAGNOSIS — I110 Hypertensive heart disease with heart failure: Secondary | ICD-10-CM | POA: Diagnosis not present

## 2016-08-06 DIAGNOSIS — I5033 Acute on chronic diastolic (congestive) heart failure: Secondary | ICD-10-CM | POA: Diagnosis not present

## 2016-08-06 DIAGNOSIS — E860 Dehydration: Secondary | ICD-10-CM | POA: Diagnosis present

## 2016-08-06 DIAGNOSIS — I252 Old myocardial infarction: Secondary | ICD-10-CM | POA: Diagnosis not present

## 2016-08-06 DIAGNOSIS — Z955 Presence of coronary angioplasty implant and graft: Secondary | ICD-10-CM | POA: Diagnosis not present

## 2016-08-06 DIAGNOSIS — Y95 Nosocomial condition: Secondary | ICD-10-CM | POA: Diagnosis present

## 2016-08-06 DIAGNOSIS — J9622 Acute and chronic respiratory failure with hypercapnia: Secondary | ICD-10-CM | POA: Diagnosis present

## 2016-08-06 DIAGNOSIS — I10 Essential (primary) hypertension: Secondary | ICD-10-CM | POA: Diagnosis not present

## 2016-08-06 DIAGNOSIS — J432 Centrilobular emphysema: Secondary | ICD-10-CM

## 2016-08-06 DIAGNOSIS — Z9981 Dependence on supplemental oxygen: Secondary | ICD-10-CM | POA: Diagnosis not present

## 2016-08-06 DIAGNOSIS — J181 Lobar pneumonia, unspecified organism: Secondary | ICD-10-CM | POA: Diagnosis not present

## 2016-08-06 DIAGNOSIS — R2681 Unsteadiness on feet: Secondary | ICD-10-CM | POA: Diagnosis not present

## 2016-08-06 DIAGNOSIS — I714 Abdominal aortic aneurysm, without rupture: Secondary | ICD-10-CM | POA: Diagnosis present

## 2016-08-06 DIAGNOSIS — R0602 Shortness of breath: Secondary | ICD-10-CM | POA: Diagnosis not present

## 2016-08-06 DIAGNOSIS — Z720 Tobacco use: Secondary | ICD-10-CM | POA: Diagnosis present

## 2016-08-06 DIAGNOSIS — Z23 Encounter for immunization: Secondary | ICD-10-CM

## 2016-08-06 DIAGNOSIS — E7800 Pure hypercholesterolemia, unspecified: Secondary | ICD-10-CM | POA: Diagnosis present

## 2016-08-06 DIAGNOSIS — Z9119 Patient's noncompliance with other medical treatment and regimen: Secondary | ICD-10-CM

## 2016-08-06 DIAGNOSIS — I5022 Chronic systolic (congestive) heart failure: Secondary | ICD-10-CM | POA: Diagnosis not present

## 2016-08-06 DIAGNOSIS — J9601 Acute respiratory failure with hypoxia: Secondary | ICD-10-CM | POA: Diagnosis not present

## 2016-08-06 DIAGNOSIS — J440 Chronic obstructive pulmonary disease with acute lower respiratory infection: Principal | ICD-10-CM | POA: Diagnosis present

## 2016-08-06 DIAGNOSIS — Z7982 Long term (current) use of aspirin: Secondary | ICD-10-CM | POA: Diagnosis not present

## 2016-08-06 DIAGNOSIS — J439 Emphysema, unspecified: Secondary | ICD-10-CM | POA: Diagnosis present

## 2016-08-06 DIAGNOSIS — Z681 Body mass index (BMI) 19 or less, adult: Secondary | ICD-10-CM | POA: Diagnosis not present

## 2016-08-06 DIAGNOSIS — M6281 Muscle weakness (generalized): Secondary | ICD-10-CM

## 2016-08-06 DIAGNOSIS — F17210 Nicotine dependence, cigarettes, uncomplicated: Secondary | ICD-10-CM | POA: Diagnosis not present

## 2016-08-06 DIAGNOSIS — J449 Chronic obstructive pulmonary disease, unspecified: Secondary | ICD-10-CM | POA: Diagnosis not present

## 2016-08-06 DIAGNOSIS — R918 Other nonspecific abnormal finding of lung field: Secondary | ICD-10-CM | POA: Diagnosis not present

## 2016-08-06 LAB — BLOOD GAS, ARTERIAL
ACID-BASE EXCESS: 10.3 mmol/L — AB (ref 0.0–2.0)
BICARBONATE: 32.6 mmol/L — AB (ref 20.0–28.0)
DRAWN BY: 270161
O2 Content: 2 L/min
O2 Saturation: 93.3 %
PH ART: 7.44 (ref 7.350–7.450)
PO2 ART: 70.7 mmHg — AB (ref 83.0–108.0)
Patient temperature: 37
pCO2 arterial: 51.4 mmHg — ABNORMAL HIGH (ref 32.0–48.0)

## 2016-08-06 LAB — BASIC METABOLIC PANEL
Anion gap: 12 (ref 5–15)
BUN: 37 mg/dL — AB (ref 6–20)
CHLORIDE: 96 mmol/L — AB (ref 101–111)
CO2: 34 mmol/L — ABNORMAL HIGH (ref 22–32)
CREATININE: 1.2 mg/dL (ref 0.61–1.24)
Calcium: 9.5 mg/dL (ref 8.9–10.3)
GFR, EST NON AFRICAN AMERICAN: 54 mL/min — AB (ref >60)
Glucose, Bld: 117 mg/dL — ABNORMAL HIGH (ref 65–99)
POTASSIUM: 3.5 mmol/L (ref 3.5–5.1)
SODIUM: 142 mmol/L (ref 135–145)

## 2016-08-06 LAB — CBC WITH DIFFERENTIAL/PLATELET
BASOS PCT: 0 %
Basophils Absolute: 0 10*3/uL (ref 0.0–0.1)
EOS ABS: 0.1 10*3/uL (ref 0.0–0.7)
EOS PCT: 0 %
HCT: 52.4 % — ABNORMAL HIGH (ref 39.0–52.0)
HEMOGLOBIN: 17.2 g/dL — AB (ref 13.0–17.0)
Lymphocytes Relative: 5 %
Lymphs Abs: 0.8 10*3/uL (ref 0.7–4.0)
MCH: 31.3 pg (ref 26.0–34.0)
MCHC: 32.8 g/dL (ref 30.0–36.0)
MCV: 95.4 fL (ref 78.0–100.0)
Monocytes Absolute: 1.8 10*3/uL — ABNORMAL HIGH (ref 0.1–1.0)
Monocytes Relative: 12 %
NEUTROS PCT: 83 %
Neutro Abs: 12.8 10*3/uL — ABNORMAL HIGH (ref 1.7–7.7)
PLATELETS: 256 10*3/uL (ref 150–400)
RBC: 5.49 MIL/uL (ref 4.22–5.81)
RDW: 14.5 % (ref 11.5–15.5)
WBC: 15.5 10*3/uL — AB (ref 4.0–10.5)

## 2016-08-06 LAB — LACTIC ACID, PLASMA
Lactic Acid, Venous: 1.3 mmol/L (ref 0.5–1.9)
Lactic Acid, Venous: 1.2 mmol/L (ref 0.5–1.9)

## 2016-08-06 LAB — BRAIN NATRIURETIC PEPTIDE: B NATRIURETIC PEPTIDE 5: 165 pg/mL — AB (ref 0.0–100.0)

## 2016-08-06 LAB — TROPONIN I
TROPONIN I: 0.07 ng/mL — AB (ref <0.03)
TROPONIN I: 0.06 ng/mL — AB (ref <0.03)

## 2016-08-06 MED ORDER — VANCOMYCIN HCL IN DEXTROSE 1-5 GM/200ML-% IV SOLN
1000.0000 mg | Freq: Once | INTRAVENOUS | Status: AC
  Administered 2016-08-06: 1000 mg via INTRAVENOUS
  Filled 2016-08-06: qty 200

## 2016-08-06 MED ORDER — DEXTROSE 5 % IV SOLN
INTRAVENOUS | Status: AC
  Filled 2016-08-06: qty 10

## 2016-08-06 MED ORDER — DEXTROSE 5 % IV SOLN
1.0000 g | INTRAVENOUS | Status: DC
  Administered 2016-08-06 – 2016-08-07 (×2): 1 g via INTRAVENOUS
  Filled 2016-08-06 (×3): qty 10

## 2016-08-06 MED ORDER — LOSARTAN POTASSIUM 25 MG PO TABS: 25.0000 mg | ORAL_TABLET | Freq: Every day | ORAL | Status: DC

## 2016-08-06 MED ORDER — ISOSORBIDE MONONITRATE ER 30 MG PO TB24
30.0000 mg | ORAL_TABLET | Freq: Every day | ORAL | Status: DC
  Administered 2016-08-07 – 2016-08-08 (×2): 30 mg via ORAL
  Filled 2016-08-06 (×2): qty 1

## 2016-08-06 MED ORDER — FERROUS SULFATE 325 (65 FE) MG PO TABS
325.0000 mg | ORAL_TABLET | Freq: Every day | ORAL | Status: DC
  Administered 2016-08-07 – 2016-08-08 (×2): 325 mg via ORAL
  Filled 2016-08-06 (×2): qty 1

## 2016-08-06 MED ORDER — INFLUENZA VAC SPLIT QUAD 0.5 ML IM SUSY
0.5000 mL | PREFILLED_SYRINGE | INTRAMUSCULAR | Status: AC
  Administered 2016-08-07: 0.5 mL via INTRAMUSCULAR
  Filled 2016-08-06: qty 0.5

## 2016-08-06 MED ORDER — IPRATROPIUM-ALBUTEROL 0.5-2.5 (3) MG/3ML IN SOLN
3.0000 mL | Freq: Once | RESPIRATORY_TRACT | Status: AC
  Administered 2016-08-06: 3 mL via RESPIRATORY_TRACT
  Filled 2016-08-06: qty 3

## 2016-08-06 MED ORDER — FINASTERIDE 5 MG PO TABS
5.0000 mg | ORAL_TABLET | Freq: Every day | ORAL | Status: DC
  Administered 2016-08-07 – 2016-08-08 (×2): 5 mg via ORAL
  Filled 2016-08-06 (×4): qty 1

## 2016-08-06 MED ORDER — PREDNISONE 50 MG PO TABS
50.0000 mg | ORAL_TABLET | Freq: Every day | ORAL | Status: DC
  Administered 2016-08-06 – 2016-08-08 (×2): 50 mg via ORAL
  Filled 2016-08-06 (×5): qty 1

## 2016-08-06 MED ORDER — ENOXAPARIN SODIUM 40 MG/0.4ML ~~LOC~~ SOLN
40.0000 mg | SUBCUTANEOUS | Status: DC
  Administered 2016-08-06 – 2016-08-07 (×2): 40 mg via SUBCUTANEOUS
  Filled 2016-08-06 (×2): qty 0.4

## 2016-08-06 MED ORDER — AZITHROMYCIN 500 MG IV SOLR
500.0000 mg | INTRAVENOUS | Status: DC
  Administered 2016-08-06 – 2016-08-07 (×2): 500 mg via INTRAVENOUS
  Filled 2016-08-06 (×3): qty 500

## 2016-08-06 MED ORDER — DEXTROSE 5 % IV SOLN
INTRAVENOUS | Status: AC
  Filled 2016-08-06: qty 500

## 2016-08-06 MED ORDER — VITAMIN D 1000 UNITS PO TABS
1000.0000 [IU] | ORAL_TABLET | Freq: Every day | ORAL | Status: DC
  Administered 2016-08-07 – 2016-08-08 (×2): 1000 [IU] via ORAL
  Filled 2016-08-06 (×4): qty 1

## 2016-08-06 MED ORDER — CARVEDILOL 3.125 MG PO TABS
3.1250 mg | ORAL_TABLET | Freq: Two times a day (BID) | ORAL | Status: DC
  Administered 2016-08-06 – 2016-08-08 (×5): 3.125 mg via ORAL
  Filled 2016-08-06 (×5): qty 1

## 2016-08-06 MED ORDER — SODIUM CHLORIDE 0.9 % IV SOLN
INTRAVENOUS | Status: AC
  Administered 2016-08-06 (×2): via INTRAVENOUS

## 2016-08-06 MED ORDER — ASPIRIN 325 MG PO TABS
325.0000 mg | ORAL_TABLET | Freq: Every day | ORAL | Status: DC
  Administered 2016-08-07 – 2016-08-08 (×2): 325 mg via ORAL
  Filled 2016-08-06 (×2): qty 1

## 2016-08-06 MED ORDER — IPRATROPIUM-ALBUTEROL 0.5-2.5 (3) MG/3ML IN SOLN: 3.0000 mL | RESPIRATORY_TRACT | Status: DC | PRN

## 2016-08-06 MED ORDER — PIPERACILLIN-TAZOBACTAM 3.375 G IVPB 30 MIN
3.3750 g | Freq: Once | INTRAVENOUS | Status: AC
  Administered 2016-08-06: 3.375 g via INTRAVENOUS
  Filled 2016-08-06: qty 50

## 2016-08-06 NOTE — ED Provider Notes (Signed)
[facility] DEPT Provider Note   CSN: [id number] Arrival date & time: 08/06/16  1512     History   Chief Complaint Chief Complaint  Patient presents with  . Shortness of Breath    HPI Karl Walker is a 82 y.o. male.  Discharged from hospital last week, initially did ok, last couple days with increased fatibue, decreased appetite, decreaed uop, not able to ambulate around the house like normal (can't even cross the hall). Has had worsening congested cough as well.    The history is provided by the patient.  Shortness of Breath  Associated symptoms include cough and leg swelling (intermittently). Pertinent negatives include no chest pain.    Past Medical History:  Diagnosis Date  . Abdominal aneurysm ([place])    scheduled procedure to take care of this 07/04/12 with Dr.Brabham  . Benign prostatic hyperplasia with urinary obstruction 12/09/2013  . Bladder diverticulum   . Bronchitis   . Bruises easily   . CAD (coronary artery disease) stent in 2007  . Cancer ([place])    basal cell ca on face  . Chest congestion    occasionally per pt and wife  . COPD (chronic obstructive pulmonary disease) ([place])    PT IS A SMOKER  . Dry skin   . Enlarged prostate    takes Proscar  . Hx of colonic polyps   . Hypercholesterolemia    takes Niacin nightly  . Hypertension    takes Hyzaar daily  . MI (myocardial infarction) 1996 and 2006   . Pneumonia    last time 51yrs ago  . Shortness of breath    with exertion  . Tracheal mass    s/p endoscopic resection  . Urinary frequency     Patient Active Problem List   Diagnosis Date Noted  . Chronic systolic CHF (congestive heart failure) ([place]) 08/06/2016  . CAP (community acquired pneumonia) 08/06/2016  . COPD exacerbation ([place]) 07/29/2016  . Hyponatremia 07/29/2016  . Acute on chronic respiratory failure with hypoxia and hypercapnia ([place]) 07/29/2016  . COPD (chronic obstructive pulmonary disease) with emphysema ([place]) 08/17/2014  .  BPH (benign prostatic hypertrophy) with urinary obstruction 12/10/2013  . Benign prostatic hyperplasia with urinary obstruction 12/09/2013  . Bladder diverticulum 12/09/2013  . Hyperlipidemia 05/31/2013  . HTN (hypertension) 05/31/2013  . Tobacco abuse 05/31/2013  . Unspecified constipation 12/31/2012  . Aftercare following surgery of the circulatory system, [place] 08/04/2012  . Tracheal mass 06/12/2012  . Coronary artery disease 06/12/2012  . Abdominal aneurysm without mention of rupture 07/16/2011  . RECTAL BLEEDING 07/20/2010  . EARLY SATIETY 07/20/2010  . WEIGHT LOSS 07/20/2010  . CHANGE IN BOWELS 07/20/2010    Past Surgical History:  Procedure Laterality Date  . ABDOMINAL AORTIC ANEURYSM REPAIR  07-04-12   EVAR  . COLONOSCOPY    . COLONOSCOPY  Oct 2011   RMR: multiple colon polyps, left-sided diverticula, adenomatous, needs surveillance Oct 2014  . CORONARY ANGIOPLASTY WITH STENT PLACEMENT  2006  . CORONARY ANGIOPLASTY WITH STENT PLACEMENT  12/17/2006   Stent to the RCA  . CYSTOSCOPY N/A 12/11/2013   Procedure: CYSTOSCOPY with evacuation of clots and fulguration of bleeders. ;  Surgeon: Claybon Jabs, MD;  Location: [facility];  Service: Urology;  Laterality: N/A;  . CYSTOSCOPY W/ RETROGRADES Bilateral 12/10/2013   Procedure: CYSTOSCOPY WITH RETROGRADE PYELOGRAM;  Surgeon: [name], MD;  Location: [facility];  Service: Urology;  Laterality: Bilateral;  . ESOPHAGOGASTRODUODENOSCOPY  Oct 2011   RMR: normal esophagus, J-shaped  stomach, mottled gastric mucosa, bulbar erosions: path minimal chronic gastritis  . HERNIA REPAIR     right inguinal  . NM MYOCAR PERF WALL MOTION  06/18/2012   No ischemia  . SKIN SURGERY    . TRANSURETHRAL RESECTION OF PROSTATE N/A 12/10/2013   Procedure: TRANSURETHRAL RESECTION OF THE PROSTATE (TURP) with bladder biopsy with fulgration;  Surgeon: [name], MD;  Location: [facility];  Service: Urology;  Laterality: N/A;  . TUMOR REMOVAL  2013   esophagus  . US  ECHOCARDIOGRAPHY  05/24/2009   mild AI,MR  . VIDEO BRONCHOSCOPY  06/25/2012   Procedure: VIDEO BRONCHOSCOPY;  Surgeon: Melrose Nakayama, MD;  Location: [facility];  Service: Thoracic;  Laterality: N/A;  Using Snare with biopsy       Home Medications    Prior to Admission medications   Medication Sig Start Date End Date Taking? Authorizing Provider  albuterol (PROVENTIL) (2.5 MG/3ML) 0.083% nebulizer solution Take 2.5 mg by nebulization daily as needed for wheezing or shortness of breath. And/or Congestion   Yes Historical Provider, MD  aspirin 325 MG tablet Take 325 mg by mouth daily.   Yes Historical Provider, MD  carvedilol (COREG) 3.125 MG tablet Take 1 tablet (3.125 mg total) by mouth 2 (two) times daily with a meal. 07/30/16  Yes Thurnell Lose, MD  cholecalciferol (VITAMIN D) 1000 UNITS tablet Take 1,000 Units by mouth daily.   Yes Historical Provider, MD  ferrous sulfate 325 (65 FE) MG EC tablet Take 325 mg by mouth daily with breakfast.   Yes Historical Provider, MD  finasteride (PROSCAR) 5 MG tablet Take 5 mg by mouth daily.   Yes Historical Provider, MD  furosemide (LASIX) 40 MG tablet Take 1 tablet (40 mg total) by mouth 2 (two) times daily. 07/30/16  Yes Thurnell Lose, MD  isosorbide mononitrate (IMDUR) 30 MG 24 hr tablet Take 1 tablet (30 mg total) by mouth daily. 07/30/16  Yes Thurnell Lose, MD  losartan (COZAAR) 25 MG tablet Take 1 tablet (25 mg total) by mouth daily. 07/30/16  Yes Thurnell Lose, MD  niacin 500 MG tablet Take 500 mg by mouth daily.    Yes Historical Provider, MD  spironolactone (ALDACTONE) 25 MG tablet Take 0.5 tablets (12.5 mg total) by mouth daily. 07/30/16  Yes Thurnell Lose, MD    Family History Family History  Problem Relation Age of Onset  . Stroke Brother     Social History Social History  Substance Use Topics  . Smoking status: Current Some Day Smoker    Packs/day: 0.25    Years: 55.00    Types: Cigarettes  . Smokeless tobacco: Never  Used  . Alcohol use No     Allergies   Iodine   Review of Systems Review of Systems  Constitutional: Positive for activity change, appetite change and fatigue.  Respiratory: Positive for cough and shortness of breath.   Cardiovascular: Positive for leg swelling (intermittently). Negative for chest pain.  Neurological: Positive for weakness.  All other systems reviewed and are negative.    Physical Exam Updated Vital Signs BP 121/63 (BP Location: Left Arm)   Pulse 82   Temp 97.9 F (36.6 C) (Oral)   Resp 18   Ht 5\' 11"  (1.803 m)   Wt 138 lb (62.6 kg)   SpO2 90%   BMI 19.25 kg/m   Physical Exam  Constitutional: He is oriented to person, place, and time. He appears well-developed and well-nourished.  HENT:  Head: Normocephalic and atraumatic.  Eyes: Conjunctivae are normal.  Neck: Neck supple.  Cardiovascular: Normal rate and regular rhythm.   No murmur heard. Pulmonary/Chest: Effort normal. No accessory muscle usage. Tachypnea noted. No respiratory distress. He has decreased breath sounds. He has rhonchi. He has rales.  Abdominal: Soft. There is no tenderness.  Musculoskeletal: He exhibits no edema or deformity.  Neurological: He is alert and oriented to person, place, and time.  Skin: Skin is warm and dry.  Psychiatric: He has a normal mood and affect.  Nursing note and vitals reviewed.    ED Treatments / Results  Labs (all labs ordered are listed, but only abnormal results are displayed) Labs Reviewed  CBC WITH DIFFERENTIAL/PLATELET - Abnormal; Notable for the following:       Result Value   WBC 15.5 (*)    Hemoglobin 17.2 (*)    HCT 52.4 (*)    Neutro Abs 12.8 (*)    Monocytes Absolute 1.8 (*)    All other components within normal limits  BASIC METABOLIC PANEL - Abnormal; Notable for the following:    Chloride 96 (*)    CO2 34 (*)    Glucose, Bld 117 (*)    BUN 37 (*)    GFR calc non Af Amer 54 (*)    All other components within normal limits    TROPONIN I - Abnormal; Notable for the following:    Troponin I 0.07 (*)    All other components within normal limits  BRAIN NATRIURETIC PEPTIDE - Abnormal; Notable for the following:    B Natriuretic Peptide 165.0 (*)    All other components within normal limits  BLOOD GAS, ARTERIAL - Abnormal; Notable for the following:    pCO2 arterial 51.4 (*)    pO2, Arterial 70.7 (*)    Bicarbonate 32.6 (*)    Acid-Base Excess 10.3 (*)    All other components within normal limits  URINALYSIS, ROUTINE W REFLEX MICROSCOPIC (NOT AT [facility])    EKG  EKG Interpretation  Date/Time:  Monday August 06 2016 15:42:39 EDT Ventricular Rate:  88 PR Interval:    QRS Duration: 140 QT Interval:  419 QTC Calculation: 507 R Axis:   -94 Text Interpretation:  Sinus rhythm Biatrial enlargement Right bundle branch block Inferior infarct, old No significant change since last tracing Confirmed by [facility] MD, Corene Cornea [phone]) on 08/06/2016 4:11:44 PM       Radiology Dg Chest 2 View  Result Date: 08/06/2016 CLINICAL DATA:  Acute onset of hypoxia.  Initial encounter. EXAM: CHEST  2 VIEW COMPARISON:  Chest radiograph performed 07/29/2016 FINDINGS: The lungs are well-aerated. Mild vascular congestion is noted. Mild peribronchial thickening is seen. Apparent basilar opacity on the lateral view could reflect mild pneumonia, depending on the patient's symptoms. There appears to be a small left-sided pleural effusion. There is no evidence of pneumothorax. The heart is normal in size; the mediastinal contour is within normal limits. No acute osseous abnormalities are seen. IMPRESSION: Mild vascular congestion noted. Mild peribronchial thickening seen. Apparent basilar opacity on the lateral view could reflect mild pneumonia, depending on the patient's symptoms. Small left pleural effusion. Electronically Signed   By: Garald Balding M.D.   On: 08/06/2016 17:08    Procedures Procedures (including critical care  time)  Medications Ordered in ED Medications  vancomycin (VANCOCIN) IVPB 1000 mg/200 mL premix (not administered)  ipratropium-albuterol (DUONEB) 0.5-2.5 (3) MG/3ML nebulizer solution 3 mL (3 mLs Nebulization Given 08/06/16 1624)  piperacillin-tazobactam (ZOSYN) IVPB  3.375 g (3.375 g Intravenous New Bag/Given 08/06/16 1727)     Initial Impression / Assessment and Plan / ED Course  I have reviewed the triage vital signs and the nursing notes.  Pertinent labs & imaging results that were available during my care of the patient were reviewed by me and considered in my medical decision making (see chart for details).  Clinical Course    Likely copd exacerbation, with possible superimposed pneumonia. Hypoxia at home O2 and PCP. Plan to treat for HCAP, admit to tele.  Final Clinical Impressions(s) / ED Diagnoses   Final diagnoses:  COPD exacerbation ([place])  HCAP (healthcare-associated pneumonia)    New Prescriptions New Prescriptions   No medications on file     Merrily Pew, MD 08/06/16 1812

## 2016-08-06 NOTE — ED Notes (Signed)
Spoke with Theadora Rama, RN to give report on pt- nurse reports she is  in another pt room at this time and will call back.

## 2016-08-06 NOTE — ED Notes (Signed)
CRITICAL VALUE ALERT  Critical value received:  Troponin 0.07  Date of notification:  08/06/16  Time of notification:  1700  Critical value read back:yes  Nurse who received alert: Tilden Fossa RN  MD notified (1st page):  Lenna Sciara [place]  Time of first page:  1702  MD notified (2nd page):  Time of second page:  Responding MD:  Lauretta Grill  Time MD responded:  [phone]

## 2016-08-06 NOTE — H&P (Signed)
History and Physical    [name] [id number] DOB: [birth year] DOA: 08/06/2016  PCP: Purvis Kilts, MD   Patient coming from: Home, by way of PCP office   Chief Complaint: Dyspnea, low O2 sat   HPI: Karl Walker is a 82 y.o. male with medical history significant for AAA status post endovascular repair, CAD with stent, chronic systolic CHF, COPD with ongoing tobacco abuse, and hypertension who presents to the emergency department at the direction of his PCP for evaluation of respiratory distress with hypoxia. Patient was recently hospitalized, discharged 07/30/2016 after management for acute on chronic systolic CHF. He was discharged with new home O2 and had been doing well with 2 L/m via nasal cannula at that time. He initially did well back at home, but over the past few days has developed severe and progressive dyspnea with exertion. He is accompanied by his wife and daughter who report apparent distress even with repositioning himself in bed. He has not gotten out of bed in the past 2 days due to severe dyspnea with minimal exertion. He was evaluated by home health RN earlier today and noted to be saturating 80% on his 2 L/m of supplemental oxygen. O2 was turned up to 4 and he was able to secure a same day appointment with his PCP. He remained hypoxic at the PCP office despite use of 4 L/m supplemental oxygen and was directed to the ED. Patient has denied any fevers or chills and does not have any significant cough. He reports losing close to 10 pounds since time of hospital discharge and has not had much of an appetite. He denies any chest pain or palpitations and denies headache, confusion, or focal numbness or weakness.   ED Course: Upon arrival to the ED, patient is found to be afebrile, saturating low 90s on 3 L/m, tachypneic with respiratory rate is fast as 40, and with vitals otherwise stable. EKG features a sinus rhythm with a chronic right bundle branch block and chest x-rays  notable for mild pulmonary vascular congestion and mild peribronchial thickening. The basilar opacity is noted on the lateral film which may reflect a mild pneumonia. Chemistry panel features a bicarbonate of 34 and BUN to creatinine ratio of >30. CBC is notable for a leukocytosis to 15,500. ABG demonstrates a pH of 7.44 with pCO2 of 51 and pO2 of 71. Troponin is mildly elevated to 0.07 and BNP is also mildly elevated 265. Patient was treated with DuoNeb in the ED and given empiric vancomycin and Zosyn. He has remained hemodynamically stable and his respiratory distress has improved following the breathing treatment. Patient will be admitted to the telemetry unit for ongoing evaluation and management of acute on chronic respiratory failure suspected secondary to acute exacerbation and COPD and a early or mild community-acquired pneumonia.  Review of Systems:  All other systems reviewed and apart from HPI, are negative.  Past Medical History:  Diagnosis Date  . Abdominal aneurysm ([place])    scheduled procedure to take care of this 07/04/12 with Dr.Brabham  . Benign prostatic hyperplasia with urinary obstruction 12/09/2013  . Bladder diverticulum   . Bronchitis   . Bruises easily   . CAD (coronary artery disease) stent in 2007  . Cancer ([place])    basal cell ca on face  . Chest congestion    occasionally per pt and wife  . COPD (chronic obstructive pulmonary disease) ([place])    PT IS A SMOKER  . Dry skin   .  Enlarged prostate    takes Proscar  . Hx of colonic polyps   . Hypercholesterolemia    takes Niacin nightly  . Hypertension    takes Hyzaar daily  . MI (myocardial infarction) 1996 and 2006   . Pneumonia    last time 57yrs ago  . Shortness of breath    with exertion  . Tracheal mass    s/p endoscopic resection  . Urinary frequency     Past Surgical History:  Procedure Laterality Date  . ABDOMINAL AORTIC ANEURYSM REPAIR  07-04-12   EVAR  . COLONOSCOPY    . COLONOSCOPY  Oct 2011    RMR: multiple colon polyps, left-sided diverticula, adenomatous, needs surveillance Oct 2014  . CORONARY ANGIOPLASTY WITH STENT PLACEMENT  2006  . CORONARY ANGIOPLASTY WITH STENT PLACEMENT  12/17/2006   Stent to the RCA  . CYSTOSCOPY N/A 12/11/2013   Procedure: CYSTOSCOPY with evacuation of clots and fulguration of bleeders. ;  Surgeon: Claybon Jabs, MD;  Location: [facility];  Service: Urology;  Laterality: N/A;  . CYSTOSCOPY W/ RETROGRADES Bilateral 12/10/2013   Procedure: CYSTOSCOPY WITH RETROGRADE PYELOGRAM;  Surgeon: [name], MD;  Location: [facility];  Service: Urology;  Laterality: Bilateral;  . ESOPHAGOGASTRODUODENOSCOPY  Oct 2011   RMR: normal esophagus, J-shaped stomach, mottled gastric mucosa, bulbar erosions: path minimal chronic gastritis  . HERNIA REPAIR     right inguinal  . NM MYOCAR PERF WALL MOTION  06/18/2012   No ischemia  . SKIN SURGERY    . TRANSURETHRAL RESECTION OF PROSTATE N/A 12/10/2013   Procedure: TRANSURETHRAL RESECTION OF THE PROSTATE (TURP) with bladder biopsy with fulgration;  Surgeon: [name], MD;  Location: [facility];  Service: Urology;  Laterality: N/A;  . TUMOR REMOVAL  2013   esophagus  . US ECHOCARDIOGRAPHY  05/24/2009   mild AI,MR  . VIDEO BRONCHOSCOPY  06/25/2012   Procedure: VIDEO BRONCHOSCOPY;  Surgeon: Melrose Nakayama, MD;  Location: [facility];  Service: Thoracic;  Laterality: N/A;  Using Snare with biopsy     reports that he has been smoking Cigarettes.  He has a 13.75 pack-year smoking history. He has never used smokeless tobacco. He reports that he does not drink alcohol or use drugs.  Allergies  Allergen Reactions  . Iodine Other (See Comments)    Per pt, issue with shrimp x 20 years ago, ok with CT contrast since    Family History  Problem Relation Age of Onset  . Stroke Brother      Prior to Admission medications   Medication Sig Start Date End Date Taking? Authorizing Provider  albuterol (PROVENTIL) (2.5 MG/3ML) 0.083% nebulizer solution  Take 2.5 mg by nebulization daily as needed for wheezing or shortness of breath. And/or Congestion   Yes Historical Provider, MD  aspirin 325 MG tablet Take 325 mg by mouth daily.   Yes Historical Provider, MD  carvedilol (COREG) 3.125 MG tablet Take 1 tablet (3.125 mg total) by mouth 2 (two) times daily with a meal. 07/30/16  Yes Thurnell Lose, MD  cholecalciferol (VITAMIN D) 1000 UNITS tablet Take 1,000 Units by mouth daily.   Yes Historical Provider, MD  ferrous sulfate 325 (65 FE) MG EC tablet Take 325 mg by mouth daily with breakfast.   Yes Historical Provider, MD  finasteride (PROSCAR) 5 MG tablet Take 5 mg by mouth daily.   Yes Historical Provider, MD  furosemide (LASIX) 40 MG tablet Take 1 tablet (40 mg total) by mouth 2 (two) times  daily. 07/30/16  Yes Thurnell Lose, MD  isosorbide mononitrate (IMDUR) 30 MG 24 hr tablet Take 1 tablet (30 mg total) by mouth daily. 07/30/16  Yes Thurnell Lose, MD  losartan (COZAAR) 25 MG tablet Take 1 tablet (25 mg total) by mouth daily. 07/30/16  Yes Thurnell Lose, MD  niacin 500 MG tablet Take 500 mg by mouth daily.    Yes Historical Provider, MD  spironolactone (ALDACTONE) 25 MG tablet Take 0.5 tablets (12.5 mg total) by mouth daily. 07/30/16  Yes Thurnell Lose, MD    Physical Exam: Vitals:   08/06/16 1528 08/06/16 1600 08/06/16 1625 08/06/16 1755  BP:  107/64  121/63  Pulse:  77  82  Resp:  25  18  Temp:      TempSrc:    Oral  SpO2:  93% 93% 90%  Weight: 62.6 kg (138 lb)     Height: 5\' 11"  (1.803 m)         Constitutional: Increased WOB, tachypnea, cachexia Eyes: PERTLA, lids and conjunctivae normal ENMT: Mucous membranes are dry. Posterior pharynx clear of any exudate or lesions.   Neck: normal, supple, no masses, no thyromegaly Respiratory: Accessory muscle recruitment, markedly diminished bilaterally, coarse rhonchi at both mid-lung zones and crackles at bases. No pallor or cyanosis.   Cardiovascular: S1 & S2 heard, regular  rate and rhythm. No extremity edema. No carotid bruits.   Abdomen: No distension, no tenderness, no masses palpated. Bowel sounds normal.  Musculoskeletal: no clubbing / cyanosis. No joint deformity upper and lower extremities. Normal muscle tone.  Skin: no significant rashes, lesions, ulcers. Warm, dry, well-perfused. Neurologic: CN 2-12 grossly intact. Sensation intact, DTR normal. Strength 5/5 in all 4 limbs.  Psychiatric: Normal judgment and insight. Alert and oriented x 3. Normal mood and affect.     Labs on Admission: I have personally reviewed following labs and imaging studies  CBC:  Recent Labs Lab 08/06/16 1555  WBC 15.5*  NEUTROABS 12.8*  HGB 17.2*  HCT 52.4*  MCV 95.4  PLT 123456   Basic Metabolic Panel:  Recent Labs Lab 08/06/16 1555  NA 142  K 3.5  CL 96*  CO2 34*  GLUCOSE 117*  BUN 37*  CREATININE 1.20  CALCIUM 9.5   GFR: Estimated Creatinine Clearance: 42 mL/min (by C-G formula based on SCr of 1.2 mg/dL). Liver Function Tests: No results for input(s): AST, ALT, ALKPHOS, BILITOT, PROT, ALBUMIN in the last 168 hours. No results for input(s): LIPASE, AMYLASE in the last 168 hours. No results for input(s): AMMONIA in the last 168 hours. Coagulation Profile: No results for input(s): INR, PROTIME in the last 168 hours. Cardiac Enzymes:  Recent Labs Lab 08/06/16 1555  TROPONINI 0.07*   BNP (last 3 results) No results for input(s): PROBNP in the last 8760 hours. HbA1C: No results for input(s): HGBA1C in the last 72 hours. CBG: No results for input(s): GLUCAP in the last 168 hours. Lipid Profile: No results for input(s): CHOL, HDL, LDLCALC, TRIG, CHOLHDL, LDLDIRECT in the last 72 hours. Thyroid Function Tests: No results for input(s): TSH, T4TOTAL, FREET4, T3FREE, THYROIDAB in the last 72 hours. Anemia Panel: No results for input(s): VITAMINB12, FOLATE, FERRITIN, TIBC, IRON, RETICCTPCT in the last 72 hours. Urine analysis:    Component Value  Date/Time   COLORURINE YELLOW 12/18/2012 [place] 12/18/2012 1722   LABSPEC 1.025 12/18/2012 1722   PHURINE 6.0 12/18/2012 1722   GLUCOSEU NEGATIVE 12/18/2012 1722   HGBUR NEGATIVE  12/18/2012 1722   BILIRUBINUR NEGATIVE 12/18/2012 1722   KETONESUR TRACE (A) 12/18/2012 1722   PROTEINUR NEGATIVE 12/18/2012 1722   UROBILINOGEN 0.2 12/18/2012 1722   NITRITE NEGATIVE 12/18/2012 1722   LEUKOCYTESUR SMALL (A) 12/18/2012 1722   Sepsis Labs: @LABRCNTIP (procalcitonin:4,lacticidven:4) )No results found for this or any previous visit (from the past 240 hour(s)).   Radiological Exams on Admission: Dg Chest 2 View  Result Date: 08/06/2016 CLINICAL DATA:  Acute onset of hypoxia.  Initial encounter. EXAM: CHEST  2 VIEW COMPARISON:  Chest radiograph performed 07/29/2016 FINDINGS: The lungs are well-aerated. Mild vascular congestion is noted. Mild peribronchial thickening is seen. Apparent basilar opacity on the lateral view could reflect mild pneumonia, depending on the patient's symptoms. There appears to be a small left-sided pleural effusion. There is no evidence of pneumothorax. The heart is normal in size; the mediastinal contour is within normal limits. No acute osseous abnormalities are seen. IMPRESSION: Mild vascular congestion noted. Mild peribronchial thickening seen. Apparent basilar opacity on the lateral view could reflect mild pneumonia, depending on the patient's symptoms. Small left pleural effusion. Electronically Signed   By: Garald Balding M.D.   On: 08/06/2016 17:08    EKG: Independently reviewed. Sinus rhythm, chronic RBBB  Assessment/Plan  1. COPD with acute exacerbation, acute on chronic respiratory failure with hypoxia and hypercarbia  - Dyspnea with minimal exertion worsening over several days leading up to admission  - [facility] RN found him to be saturating 80% on 2 Lpm at home on day of admit and he was seen by PCP  - Saturating mid-80's at PCP office on 4 Lpm and  sent to ED  - CXR with mild congestion and questionable infiltrate on lateral film  - Clinical findings c/w PNA, aeCOPD - Treat with DuoNebs, prednisone 50 mg qD, Rocephin, and azithromycin  - Check lactate and give gentle IVF hydration; appears dehydrated, but not septic    2. CAP  - There is a questionable infiltrate on lateral chest film with rhonchi, hypoxia, and leukocytosis - Was treated with vancomycin and Zosyn in ED, but was only hospitalized for 1 night recently and does not live in NH or have hx of MDR organism - He is mentating well on admission  - Check sputum culture and gram stain; check urine for strep pneumo antigens  - Treat with empiric Rocephin and azithromycin   3. Chronic systolic CHF  - Appears dry on admission; BUN/Cr ratio 31, CBC hemoconcentrated, wt down 20 lbs since recent discharge; has not urinated since arrival  - TTE (07/29/16) with EF 30-35%, diffuse HK, severe concentric hypertrophy, mild MR - Managed at home with Lasix, Aldactone, losartan, and Coreg  - Continue Coreg as tolerated; diuretics and losartan held at time of admission given apparent dehydration and bump in SCr  - Provide gentle IVF hydration, follow daily wts, I/O's, and daily chem panel    4. CAD - No angina on admission  - EKG without acute ischemic features  - Troponin elevated to 0.07 in ED, down from 0.2 range during recent admission and likely reflecting demand ischemia in setting of hypoxia and respiratory distress - Monitor on telemetry, trend troponin, repeat EKG in am  - Continue Imdur, ASA, Coreg    5. Hypertension  - Soft BP on admission  - Continue Coreg as tolerated - Losartan, Lasix, and Aldactone held at time of admission d/t soft BP and dehydration     DVT prophylaxis: sq Lovenox  Code Status: Full  Family  Communication: Daughter and wife updated at bedside at patient's request Disposition Plan: Admit to telemetry Consults called: None Admission status:  Inpatient   Vianne Bulls, MD [facility] Pager [phone]  If 7PM-7AM, please contact night-coverage [email or web address] Password [email or web address]  08/06/2016, 6:59 PM

## 2016-08-06 NOTE — ED Triage Notes (Signed)
Pt sent over by Dr. Nolon Rod office for eval. Per family report, pt hypoxic and needs workup.

## 2016-08-06 NOTE — ED Notes (Signed)
Patient has not been able to go to bathroom. Family will notify staff when he is able to use the bathroom.

## 2016-08-07 LAB — CBC WITH DIFFERENTIAL/PLATELET
Basophils Absolute: 0 10*3/uL (ref 0.0–0.1)
Basophils Relative: 0 %
Eosinophils Absolute: 0 10*3/uL (ref 0.0–0.7)
Eosinophils Relative: 0 %
HEMATOCRIT: 47.1 % (ref 39.0–52.0)
HEMOGLOBIN: 15.6 g/dL (ref 13.0–17.0)
LYMPHS ABS: 0.5 10*3/uL — AB (ref 0.7–4.0)
LYMPHS PCT: 3 %
MCH: 30.9 pg (ref 26.0–34.0)
MCHC: 33.1 g/dL (ref 30.0–36.0)
MCV: 93.3 fL (ref 78.0–100.0)
MONO ABS: 0.6 10*3/uL (ref 0.1–1.0)
MONOS PCT: 4 %
NEUTROS ABS: 13.4 10*3/uL — AB (ref 1.7–7.7)
NEUTROS PCT: 93 %
Platelets: 227 10*3/uL (ref 150–400)
RBC: 5.05 MIL/uL (ref 4.22–5.81)
RDW: 14.5 % (ref 11.5–15.5)
WBC: 14.5 10*3/uL — ABNORMAL HIGH (ref 4.0–10.5)

## 2016-08-07 LAB — BASIC METABOLIC PANEL
ANION GAP: 9 (ref 5–15)
BUN: 39 mg/dL — ABNORMAL HIGH (ref 6–20)
CHLORIDE: 97 mmol/L — AB (ref 101–111)
CO2: 33 mmol/L — AB (ref 22–32)
Calcium: 8.7 mg/dL — ABNORMAL LOW (ref 8.9–10.3)
Creatinine, Ser: 1.11 mg/dL (ref 0.61–1.24)
GFR calc Af Amer: >60 mL/min (ref >60)
GFR calc non Af Amer: 60 mL/min — ABNORMAL LOW (ref >60)
GLUCOSE: 149 mg/dL — AB (ref 65–99)
POTASSIUM: 3.5 mmol/L (ref 3.5–5.1)
Sodium: 139 mmol/L (ref 135–145)

## 2016-08-07 LAB — TROPONIN I: Troponin I: 0.06 ng/mL (ref <0.03)

## 2016-08-07 NOTE — Progress Notes (Signed)
PROGRESS NOTE    Karl Walker  [id number] DOB: [birth year] DOA: 08/06/2016 PCP: Purvis Kilts, MD    Brief Narrative:   82 y.o. male with medical history significant for AAA status post endovascular repair, CAD with stent, chronic systolic CHF, COPD with ongoing tobacco abuse, and hypertension who presents to the emergency department at the direction of his PCP for evaluation of respiratory distress with hypoxia. Patient was recently hospitalized, discharged 07/30/2016 after management for acute on chronic systolic CHF. He was discharged with new home O2 and had been doing well with 2 L/m via nasal cannula at that time. He initially did well back at home, but over the past few days has developed severe and progressive dyspnea with exertion. He is accompanied by his wife and daughter who report apparent distress even with repositioning himself in bed. He has not gotten out of bed in the past 2 days due to severe dyspnea with minimal exertion. He was evaluated by home health RN earlier today and noted to be saturating 80% on his 2 L/m of supplemental oxygen. O2 was turned up to 4 and he was able to secure a same day appointment with his PCP. He remained hypoxic at the PCP office despite use of 4 L/m supplemental oxygen and was directed to the ED. Patient has denied any fevers or chills and does not have any significant cough. He reports losing close to 10 pounds since time of hospital discharge and has not had much of an appetite. He denies any chest pain or palpitations and denies headache, confusion, or focal numbness or weakness  Assessment & Plan:   Principal Problem:   CAP (community acquired pneumonia) Active Problems:   Coronary artery disease   HTN (hypertension)   Tobacco abuse   COPD (chronic obstructive pulmonary disease) with emphysema (HCC)   COPD exacerbation ([facility])   Acute on chronic respiratory failure with hypoxia and hypercapnia ([facility])   Chronic systolic CHF (congestive  heart failure) ([place])   1. COPD with acute exacerbation, acute on chronic respiratory failure with hypoxia and hypercarbia  -Patient recently discharged for COPD exacerbation -Patient was discharged on 2 L nasal cannula -Family present in room, states that patient has been noncompliant with his home O2 and frequently takes it off from time to time -Suspect noncompliance as etiology for presenting hypoxia -Continue steroids and scheduled breathing treatments as tolerated -patient currently stable on 2 L nasal cannula. -On exam patient continues have both inspiratory and expiratory wheezing  2. CAP  -Patient is continued on empiric Rocephin and azithromycin -Afebrile.  3. Chronic systolic CHF  -Clinically dehydrated at time of presentation -Patient had been continued on gentle IV fluids overnight Patient is continued on Coreg. Diuretics and losartan have been held -2-D echocardiogram from 07/29/2016 demonstrated an EF of 30-35% with diffuse hypokinesis, severe concentric hypertrophy, mild MR. -Repeat CBC in the morning  4. CAD -Not complaining of chest pain -We'll continue patient on indoor, aspirin, Coreg  5. Hypertension  -Blood pressure noted to be soft at time of presentation -Patient is continued on Coreg -Per above, losartan, Lasix and Aldactone have been held for the time being.   DVT prophylaxis: Lovenox subcutaneous Code Status: Full code Family Communication: Patient in room, family at bedside Disposition Plan: Uncertain at this time  Consultants:     Procedures:     Antimicrobials: Anti-infectives    Start     Dose/Rate Route Frequency Ordered Stop   08/06/16 2200  azithromycin (ZITHROMAX) 500  mg in dextrose 5 % 250 mL IVPB     500 mg 250 mL/hr over 60 Minutes Intravenous Every 24 hours 08/06/16 1859 08/13/16 2159   08/06/16 2100  cefTRIAXone (ROCEPHIN) 1 g in dextrose 5 % 50 mL IVPB     1 g 100 mL/hr over 30 Minutes Intravenous Every 24 hours  08/06/16 1859 08/13/16 2059   08/06/16 1730  vancomycin (VANCOCIN) IVPB 1000 mg/200 mL premix     1,000 mg 200 mL/hr over 60 Minutes Intravenous  Once 08/06/16 1714 08/06/16 1932   08/06/16 1715  piperacillin-tazobactam (ZOSYN) IVPB 3.375 g     3.375 g 100 mL/hr over 30 Minutes Intravenous  Once 08/06/16 1714 08/06/16 1814       Subjective: No complaints at this time. Patient reports feeling better  Objective: Vitals:   08/06/16 2030 08/06/16 2057 08/07/16 0500 08/07/16 1334  BP: 129/69 108/69 (!) 142/59 (!) 141/73  Pulse: 79 84 70 68  Resp: 21 20 20 20   Temp:  98.4 F (36.9 C) 97.5 F (36.4 C) 97.7 F (36.5 C)  TempSrc:  Oral Oral Oral  SpO2: 92% 90% 91% 95%  Weight:  62.1 kg (137 lb) 63.2 kg (139 lb 4.8 oz)   Height:  5\' 11"  (1.803 m)      Intake/Output Summary (Last 24 hours) at 08/07/16 1713 Last data filed at 08/07/16 1200  Gross per 24 hour  Intake              988 ml  Output                0 ml  Net              988 ml   Filed Weights   08/06/16 1528 08/06/16 2057 08/07/16 0500  Weight: 62.6 kg (138 lb) 62.1 kg (137 lb) 63.2 kg (139 lb 4.8 oz)    Examination:  General exam: Appears calm and comfortable, Lying in bed  Respiratory system:  Respiratory effort normal, and expiratory wheezing and mild inspiratory wheezing Cardiovascular system: S1 & S2 heard, RRR Gastrointestinal system: Soft, nondistended, positive bowel sounds Central nervous system: CN II through XII grossly intact, no tremors Extremities: Symmetric 5 x 5 power, no joint deformities. Skin: No rashes, lesions or ulcers Psychiatry: Judgement and insight appear normal. Mood & affect appropriate.   Data Reviewed: I have personally reviewed following labs and imaging studies  CBC:  Recent Labs Lab 08/06/16 1555 08/07/16 0325  WBC 15.5* 14.5*  NEUTROABS 12.8* 13.4*  HGB 17.2* 15.6  HCT 52.4* 47.1  MCV 95.4 93.3  PLT 256 [id number]   Basic Metabolic Panel:  Recent Labs Lab 08/06/16 1555  08/07/16 0325  NA 142 139  K 3.5 3.5  CL 96* 97*  CO2 34* 33*  GLUCOSE 117* 149*  BUN 37* 39*  CREATININE 1.20 1.11  CALCIUM 9.5 8.7*   GFR: Estimated Creatinine Clearance: 45.9 mL/min (by C-G formula based on SCr of 1.11 mg/dL). Liver Function Tests: No results for input(s): AST, ALT, ALKPHOS, BILITOT, PROT, ALBUMIN in the last 168 hours. No results for input(s): LIPASE, AMYLASE in the last 168 hours. No results for input(s): AMMONIA in the last 168 hours. Coagulation Profile: No results for input(s): INR, PROTIME in the last 168 hours. Cardiac Enzymes:  Recent Labs Lab 08/06/16 1555 08/06/16 2147 08/07/16 0325  TROPONINI 0.07* 0.06* 0.06*   BNP (last 3 results) No results for input(s): PROBNP in the last 8760 hours. HbA1C: No results for  input(s): HGBA1C in the last 72 hours. CBG: No results for input(s): GLUCAP in the last 168 hours. Lipid Profile: No results for input(s): CHOL, HDL, LDLCALC, TRIG, CHOLHDL, LDLDIRECT in the last 72 hours. Thyroid Function Tests: No results for input(s): TSH, T4TOTAL, FREET4, T3FREE, THYROIDAB in the last 72 hours. Anemia Panel: No results for input(s): VITAMINB12, FOLATE, FERRITIN, TIBC, IRON, RETICCTPCT in the last 72 hours. Sepsis Labs:  Recent Labs Lab 08/06/16 1910 08/06/16 2147  LATICACIDVEN 1.3 1.2    No results found for this or any previous visit (from the past 240 hour(s)).   Radiology Studies: Dg Chest 2 View  Result Date: 08/06/2016 CLINICAL DATA:  Acute onset of hypoxia.  Initial encounter. EXAM: CHEST  2 VIEW COMPARISON:  Chest radiograph performed 07/29/2016 FINDINGS: The lungs are well-aerated. Mild vascular congestion is noted. Mild peribronchial thickening is seen. Apparent basilar opacity on the lateral view could reflect mild pneumonia, depending on the patient's symptoms. There appears to be a small left-sided pleural effusion. There is no evidence of pneumothorax. The heart is normal in size; the  mediastinal contour is within normal limits. No acute osseous abnormalities are seen. IMPRESSION: Mild vascular congestion noted. Mild peribronchial thickening seen. Apparent basilar opacity on the lateral view could reflect mild pneumonia, depending on the patient's symptoms. Small left pleural effusion. Electronically Signed   By: Garald Balding M.D.   On: 08/06/2016 17:08    Scheduled Meds: . aspirin  325 mg Oral Daily  . azithromycin  500 mg Intravenous Q24H  . carvedilol  3.125 mg Oral BID WC  . cefTRIAXone (ROCEPHIN)  IV  1 g Intravenous Q24H  . cholecalciferol  1,000 Units Oral Daily  . enoxaparin (LOVENOX) injection  40 mg Subcutaneous Q24H  . ferrous sulfate  325 mg Oral Q breakfast  . finasteride  5 mg Oral Daily  . isosorbide mononitrate  30 mg Oral Daily  . predniSONE  50 mg Oral Q breakfast   Continuous Infusions:    LOS: 1 day   Beyonca Wisz, Orpah Melter, MD [facility] Pager [phone]  If 7PM-7AM, please contact night-coverage [email or web address] Password [email or web address] 08/07/2016, 5:13 PM

## 2016-08-07 NOTE — Care Management Note (Signed)
Case Management Note  Patient Details  Name: Karl Walker MRN: [id number] Date of Birth: 02/27/1932  Subjective/Objective:  Patient adm from home with CAP. He has recently started services with [facility] for RN  And PT. He has home oxygen PTA. Spoke with daughter Lisabeth Pick, who expresses need for a sitter a few times a week.            Action/Plan: Will resume home health at discharge and list provided to daughter for private duty agencies for sitter options.    Expected Discharge Date:       08/07/2016 Expected Discharge Plan:  [place]  In-House Referral:  NA  Discharge planning Services  CM Consult  Post Acute Care Choice:  Home Health, Resumption of Svcs/PTA Provider Choice offered to:     DME Arranged:    DME Agency:     HH Arranged:    HH Agency:     Status of Service:  In process, will continue to follow  If discussed at Long Length of Stay Meetings, dates discussed:    Additional Comments:  Sonnie Bias, Chauncey Reading, RN 08/07/2016, 1:34 PM

## 2016-08-07 NOTE — Consult Note (Signed)
   [facility] CM Inpatient Consult   08/07/2016  Tadeusz Stahl Harding 06-08-1932 [id number]   Met with the patient and daughter, Lisabeth Pick at bedside to offer [place] Management services as benefit of [facility] Medicare. Patient daughter accepted a brochure and will discuss with patient wife as she feels this would be a beneficial service for patient and caregiver. Discussed program and that the patient caregiver would receive post hospital discharge call and will be evaluated for monthly home visits. Daughter to follow up later this week.  Of note, [facility] Care Management services does not replace or interfere with any services that are arranged by inpatient case management or social work.  For additional questions or referrals please contact:   Royetta Crochet. Laymond Purser, RN, BSN, [place] [phone]) Business Cell  [phone]) Toll Free Office

## 2016-08-07 NOTE — Care Management Important Message (Signed)
Important Message  Patient Details  Name: CHINEDU HUMPHREYS MRN: [id number] Date of Birth: 14-Dec-1931   Medicare Important Message Given:  Yes    Ayshia Gramlich, Chauncey [place], RN 08/07/2016, 2:05 PM

## 2016-08-07 NOTE — Progress Notes (Signed)
[place] for Renal Adjustment of ABX if needed ABX:  Rocephin and Zithromax  Allergies  Allergen Reactions  . Iodine Other (See Comments)    Per pt, issue with shrimp x 20 years ago, ok with CT contrast since   Patient Measurements: Height: 5\' 11"  (180.3 cm) Weight: 139 lb 4.8 oz (63.2 kg) IBW/kg (Calculated) : 75.3  Vital Signs: Temp: 97.5 F (36.4 C) (10/17 0500) Temp Source: Oral (10/17 0500) BP: 142/59 (10/17 0500) Pulse Rate: 70 (10/17 0500)  Labs:  Recent Labs  08/06/16 1555 08/07/16 0325  WBC 15.5* 14.5*  HGB 17.2* 15.6  PLT 256 227  CREATININE 1.20  --    No results for input(s): VANCOTROUGH, VANCOPEAK, VANCORANDOM, GENTTROUGH, GENTPEAK, GENTRANDOM, TOBRATROUGH, TOBRAPEAK, TOBRARND, AMIKACINPEAK, AMIKACINTROU, AMIKACIN in the last 72 hours.   Medical History: Past Medical History:  Diagnosis Date  . Abdominal aneurysm ([place])    scheduled procedure to take care of this 07/04/12 with Dr.Brabham  . Benign prostatic hyperplasia with urinary obstruction 12/09/2013  . Bladder diverticulum   . Bronchitis   . Bruises easily   . CAD (coronary artery disease) stent in 2007  . Cancer ([place])    basal cell ca on face  . Chest congestion    occasionally per pt and wife  . COPD (chronic obstructive pulmonary disease) ([place])    PT IS A SMOKER  . Dry skin   . Enlarged prostate    takes Proscar  . Hx of colonic polyps   . Hypercholesterolemia    takes Niacin nightly  . Hypertension    takes Hyzaar daily  . MI (myocardial infarction) 1996 and 2006   . Pneumonia    last time 4yrs ago  . Shortness of breath    with exertion  . Tracheal mass    s/p endoscopic resection  . Urinary frequency    Assessment: 82yo male started on Rocephin and Zithromax.  No renal adjustment needed.  Estimated Creatinine Clearance: 42.4 mL/min (by C-G formula based on SCr of 1.2 mg/dL).  Plan: Continue current Rx F/U to switch Zithromax to PO  Ena Dawley, [facility] 08/07/2016

## 2016-08-08 ENCOUNTER — Encounter: Payer: Commercial Managed Care - HMO | Admitting: Cardiovascular Disease

## 2016-08-08 DIAGNOSIS — Z23 Encounter for immunization: Secondary | ICD-10-CM | POA: Diagnosis not present

## 2016-08-08 DIAGNOSIS — J181 Lobar pneumonia, unspecified organism: Secondary | ICD-10-CM

## 2016-08-08 LAB — URINALYSIS, ROUTINE W REFLEX MICROSCOPIC
BILIRUBIN URINE: NEGATIVE
Glucose, UA: NEGATIVE mg/dL
Ketones, ur: NEGATIVE mg/dL
Leukocytes, UA: NEGATIVE
Nitrite: NEGATIVE
PH: 6 (ref 5.0–8.0)
Protein, ur: 30 mg/dL — AB
SPECIFIC GRAVITY, URINE: 1.025 (ref 1.005–1.030)

## 2016-08-08 LAB — URINE MICROSCOPIC-ADD ON

## 2016-08-08 LAB — CBC
HEMATOCRIT: 49.8 % (ref 39.0–52.0)
HEMOGLOBIN: 16.3 g/dL (ref 13.0–17.0)
MCH: 31 pg (ref 26.0–34.0)
MCHC: 32.7 g/dL (ref 30.0–36.0)
MCV: 94.7 fL (ref 78.0–100.0)
Platelets: 240 10*3/uL (ref 150–400)
RBC: 5.26 MIL/uL (ref 4.22–5.81)
RDW: 14.6 % (ref 11.5–15.5)
WBC: 16.7 10*3/uL — ABNORMAL HIGH (ref 4.0–10.5)

## 2016-08-08 LAB — HIV ANTIBODY (ROUTINE TESTING W REFLEX): HIV SCREEN 4TH GENERATION: NONREACTIVE

## 2016-08-08 LAB — BASIC METABOLIC PANEL
ANION GAP: 9 (ref 5–15)
BUN: 31 mg/dL — AB (ref 6–20)
CO2: 35 mmol/L — AB (ref 22–32)
Calcium: 8.9 mg/dL (ref 8.9–10.3)
Chloride: 98 mmol/L — ABNORMAL LOW (ref 101–111)
Creatinine, Ser: 0.91 mg/dL (ref 0.61–1.24)
GFR calc Af Amer: >60 mL/min (ref >60)
GLUCOSE: 130 mg/dL — AB (ref 65–99)
POTASSIUM: 3.3 mmol/L — AB (ref 3.5–5.1)
Sodium: 142 mmol/L (ref 135–145)

## 2016-08-08 LAB — STREP PNEUMONIAE URINARY ANTIGEN: Strep Pneumo Urinary Antigen: NEGATIVE

## 2016-08-08 MED ORDER — PREDNISONE 10 MG PO TABS: 10.0000 mg | ORAL_TABLET | Freq: Every day | ORAL | 0 refills | Status: AC

## 2016-08-08 MED ORDER — AZITHROMYCIN 250 MG PO TABS: 500.0000 mg | ORAL_TABLET | Freq: Every day | ORAL | Status: DC

## 2016-08-08 MED ORDER — LEVOFLOXACIN 500 MG PO TABS: 500.0000 mg | ORAL_TABLET | Freq: Every day | ORAL | 0 refills | Status: AC

## 2016-08-08 NOTE — Progress Notes (Signed)
PT Cancellation Note  Patient Details Name: [name] MRN: [id number] DOB: 1932/09/03   Cancelled Treatment:    Reason Eval/Treat Not Completed: Other (comment) (Attempted to see pt for PT x 2 this AM, however, on first attempt, pt was receiving a bath, and on 2nd trial, pt had just received his lunch.  Will check back this PM. )   Beth Pepper Wyndham, PT, DPT X: [phone]

## 2016-08-08 NOTE — Progress Notes (Signed)
Pt removed IV around 1530, site clean and dry.  Reviewed discharge instructions with pt and family, answered questions  At this time.

## 2016-08-08 NOTE — Progress Notes (Signed)
[place] for Renal Adjustment of ABX if needed ABX:  Rocephin and Zithromax  Allergies  Allergen Reactions  . Iodine Other (See Comments)    Per pt, issue with shrimp x 20 years ago, ok with CT contrast since   Patient Measurements: Height: 5\' 11"  (180.3 cm) Weight: 138 lb 14.6 oz (63 kg) IBW/kg (Calculated) : 75.3  Vital Signs: Temp: 98.4 F (36.9 C) (10/18 0542) Temp Source: Oral (10/18 0542) BP: 179/76 (10/18 0749) Pulse Rate: 77 (10/18 0749)  Labs:  Recent Labs  08/06/16 1555 08/07/16 0325 08/08/16 0602  WBC 15.5* 14.5* 16.7*  HGB 17.2* 15.6 16.3  PLT 256 227 240  CREATININE 1.20 1.11 0.91   No results for input(s): VANCOTROUGH, VANCOPEAK, VANCORANDOM, GENTTROUGH, GENTPEAK, GENTRANDOM, TOBRATROUGH, TOBRAPEAK, TOBRARND, AMIKACINPEAK, AMIKACINTROU, AMIKACIN in the last 72 hours.   Medical History: Past Medical History:  Diagnosis Date  . Abdominal aneurysm ([place])    scheduled procedure to take care of this 07/04/12 with Dr.Brabham  . Benign prostatic hyperplasia with urinary obstruction 12/09/2013  . Bladder diverticulum   . Bronchitis   . Bruises easily   . CAD (coronary artery disease) stent in 2007  . Cancer ([place])    basal cell ca on face  . Chest congestion    occasionally per pt and wife  . COPD (chronic obstructive pulmonary disease) ([place])    PT IS A SMOKER  . Dry skin   . Enlarged prostate    takes Proscar  . Hx of colonic polyps   . Hypercholesterolemia    takes Niacin nightly  . Hypertension    takes Hyzaar daily  . MI (myocardial infarction) 1996 and 2006   . Pneumonia    last time 49yrs ago  . Shortness of breath    with exertion  . Tracheal mass    s/p endoscopic resection  . Urinary frequency    Assessment: 82yo male started on Rocephin and Zithromax for CAP.  Afebrile.  Also on PO steroids.  No renal adjustment needed.   PHARMACIST - PHYSICIAN COMMUNICATION CONCERNING: Antibiotic IV to Oral Route  Change Policy  RECOMMENDATION / PLAN: This patient is receiving ZITHROMAX by the intravenous route.  Based on criteria approved by the Pharmacy and Therapeutics Committee, the antibiotic(s) is/are being converted to the equivalent oral dose form(s).  DESCRIPTION: These criteria include:  Patient being treated for a respiratory tract infection, urinary tract infection, cellulitis or clostridium difficile associated diarrhea if on metronidazole  The patient is not neutropenic and does not exhibit a GI malabsorption state  The patient is eating (either orally or via tube) and/or has been taking other orally administered medications for a least 24 hours  The patient is improving clinically and has a Tmax < 100.5  If you have questions about this conversion, please contact the Pharmacy Department  [x]   [phone] )  [place], PharmD Clinical Pharmacist Pager:  [phone] 08/08/2016

## 2016-08-08 NOTE — Discharge Summary (Signed)
Physician Discharge Summary  Karl Walker [id number] DOB: 11/14/1931 DOA: 08/06/2016  PCP: Purvis Kilts, MD  Admit date: 08/06/2016 Discharge date: 08/08/2016  Time spent: 45 minutes  Recommendations for Outpatient Follow-up:  -Patient is advised to follow-up with primary care provider in 2 weeks. -Believe his reason for readmission to the hospital is medical noncompliance with both his oxygen and failure to quit smoking as well as not taking his medications.  -I have discussed his discharge via phone with his wife who believes that she is unable to care for him at home and is requesting placement, have discussed with social work to see if we can accomplish this prior to discharge today.   Discharge Diagnoses:  Principal Problem:   CAP (community acquired pneumonia) Active Problems:   Coronary artery disease   HTN (hypertension)   Tobacco abuse   COPD (chronic obstructive pulmonary disease) with emphysema (HCC)   COPD exacerbation (HCC)   Acute on chronic respiratory failure with hypoxia and hypercapnia (HCC)   Chronic systolic CHF (congestive heart failure) ([place])   Discharge Condition: Stable and improved  Filed Weights   08/06/16 2057 08/07/16 0500 08/08/16 0542  Weight: 62.1 kg (137 lb) 63.2 kg (139 lb 4.8 oz) 63 kg (138 lb 14.6 oz)    History of present illness:  As per Dr. Myna Hidalgo on 10/16: Karl Walker is a 82 y.o. male with medical history significant for AAA status post endovascular repair, CAD with stent, chronic systolic CHF, COPD with ongoing tobacco abuse, and hypertension who presents to the emergency department at the direction of his PCP for evaluation of respiratory distress with hypoxia. Patient was recently hospitalized, discharged 07/30/2016 after management for acute on chronic systolic CHF. He was discharged with new home O2 and had been doing well with 2 L/m via nasal cannula at that time. He initially did well back at home, but over the past  few days has developed severe and progressive dyspnea with exertion. He is accompanied by his wife and daughter who report apparent distress even with repositioning himself in bed. He has not gotten out of bed in the past 2 days due to severe dyspnea with minimal exertion. He was evaluated by home health RN earlier today and noted to be saturating 80% on his 2 L/m of supplemental oxygen. O2 was turned up to 4 and he was able to secure a same day appointment with his PCP. He remained hypoxic at the PCP office despite use of 4 L/m supplemental oxygen and was directed to the ED. Patient has denied any fevers or chills and does not have any significant cough. He reports losing close to 10 pounds since time of hospital discharge and has not had much of an appetite. He denies any chest pain or palpitations and denies headache, confusion, or focal numbness or weakness.   ED Course: Upon arrival to the ED, patient is found to be afebrile, saturating low 90s on 3 L/m, tachypneic with respiratory rate is fast as 40, and with vitals otherwise stable. EKG features a sinus rhythm with a chronic right bundle branch block and chest x-rays notable for mild pulmonary vascular congestion and mild peribronchial thickening. The basilar opacity is noted on the lateral film which may reflect a mild pneumonia. Chemistry panel features a bicarbonate of 34 and BUN to creatinine ratio of >30. CBC is notable for a leukocytosis to 15,500. ABG demonstrates a pH of 7.44 with pCO2 of 51 and pO2 of 71. Troponin  is mildly elevated to 0.07 and BNP is also mildly elevated 265. Patient was treated with DuoNeb in the ED and given empiric vancomycin and Zosyn. He has remained hemodynamically stable and his respiratory distress has improved following the breathing treatment. Patient will be admitted to the telemetry unit for ongoing evaluation and management of acute on chronic respiratory failure suspected secondary to acute exacerbation and COPD and  a early or mild community-acquired pneumonia.  Hospital Course:  1. COPD with acute exacerbation, acute on chronic respiratory failure with hypoxia and hypercarbia  -Patient recently discharged for COPD exacerbation -Patient was discharged on 2 L nasal cannula -Family states that patient has been noncompliant with his home O2 and frequently takes it off from time to time -Suspect noncompliance as etiology for presenting hypoxia -Continue steroids and levaquin on DC. -patient currently stable on 2 L nasal cannula. -Wife interested in placement, will ask social work to see, to see if this can been accomplished prior to discharge today.  2. CAP  -Patient is continued on empiric levaquin. -All cx data remains negative to date.  3. Chronic systolic CHF  -2-D echocardiogram from 07/29/2016 demonstrated an EF of 30-35% with diffuse hypokinesis, severe concentric hypertrophy, mild MR. -Compensated at present.  4. CAD -Not complaining of chest pain -Will continue patient on indoor, aspirin, Coreg  5. Hypertension  -Blood pressure noted to be slowly rising. -Will resume home medications on DC.   Procedures:  None   Consultations:  None  Discharge Instructions  Discharge Instructions    Diet - low sodium heart healthy    Complete by:  As directed    Increase activity slowly    Complete by:  As directed        Medication List    TAKE these medications   albuterol (2.5 MG/3ML) 0.083% nebulizer solution Commonly known as:  PROVENTIL Take 2.5 mg by nebulization daily as needed for wheezing or shortness of breath. And/or Congestion   aspirin 325 MG tablet Take 325 mg by mouth daily.   carvedilol 3.125 MG tablet Commonly known as:  COREG Take 1 tablet (3.125 mg total) by mouth 2 (two) times daily with a meal.   cholecalciferol 1000 units tablet Commonly known as:  VITAMIN D Take 1,000 Units by mouth daily.   ferrous sulfate 325 (65 FE) MG EC tablet Take 325 mg by  mouth daily with breakfast.   finasteride 5 MG tablet Commonly known as:  PROSCAR Take 5 mg by mouth daily.   furosemide 40 MG tablet Commonly known as:  LASIX Take 1 tablet (40 mg total) by mouth 2 (two) times daily.   isosorbide mononitrate 30 MG 24 hr tablet Commonly known as:  IMDUR Take 1 tablet (30 mg total) by mouth daily.   levofloxacin 500 MG tablet Commonly known as:  LEVAQUIN Take 1 tablet (500 mg total) by mouth daily.   losartan 25 MG tablet Commonly known as:  COZAAR Take 1 tablet (25 mg total) by mouth daily.   niacin 500 MG tablet Take 500 mg by mouth daily.   predniSONE 10 MG tablet Commonly known as:  DELTASONE Take 1 tablet (10 mg total) by mouth daily with breakfast.   spironolactone 25 MG tablet Commonly known as:  ALDACTONE Take 0.5 tablets (12.5 mg total) by mouth daily.      Allergies  Allergen Reactions  . Iodine Other (See Comments)    Per pt, issue with shrimp x 20 years ago, ok with CT contrast since  Follow-up Information    Purvis Kilts, MD. Schedule an appointment as soon as possible for a visit in 2 week(s).   Specialty:  Family Medicine Contact information: [address] [place] [id number] [phone]            The results of significant diagnostics from this hospitalization (including imaging, microbiology, ancillary and laboratory) are listed below for reference.    Significant Diagnostic Studies: Dg Chest 2 View  Result Date: 08/06/2016 CLINICAL DATA:  Acute onset of hypoxia.  Initial encounter. EXAM: CHEST  2 VIEW COMPARISON:  Chest radiograph performed 07/29/2016 FINDINGS: The lungs are well-aerated. Mild vascular congestion is noted. Mild peribronchial thickening is seen. Apparent basilar opacity on the lateral view could reflect mild pneumonia, depending on the patient's symptoms. There appears to be a small left-sided pleural effusion. There is no evidence of pneumothorax. The heart is normal in  size; the mediastinal contour is within normal limits. No acute osseous abnormalities are seen. IMPRESSION: Mild vascular congestion noted. Mild peribronchial thickening seen. Apparent basilar opacity on the lateral view could reflect mild pneumonia, depending on the patient's symptoms. Small left pleural effusion. Electronically Signed   By: Garald Balding M.D.   On: 08/06/2016 17:08   Dg Chest 2 View  Result Date: 07/29/2016 CLINICAL DATA:  Sudden onset shortness of breath. Cough. Decreased oxygenation. EXAM: CHEST  2 VIEW COMPARISON:  CT chest 08/16/2015.  Chest 12/18/2012. FINDINGS: Diffuse emphysematous changes in the lungs. Peribronchial thickening and interstitial fibrosis consistent with chronic bronchitis. Heart size and pulmonary vascularity are normal. No focal airspace disease or consolidation in the lungs. No blunting of costophrenic angles. No pneumothorax. Calcified and tortuous aorta. Degenerative changes in the spine. Vascular stent demonstrated in the upper abdomen. IMPRESSION: Emphysematous and chronic bronchitic changes in the lungs. No evidence of active pulmonary disease. Electronically Signed   By: Lucienne Capers M.D.   On: 07/29/2016 03:01    Microbiology: No results found for this or any previous visit (from the past 240 hour(s)).   Labs: Basic Metabolic Panel:  Recent Labs Lab 08/06/16 1555 08/07/16 0325 08/08/16 0602  NA 142 139 142  K 3.5 3.5 3.3*  CL 96* 97* 98*  CO2 34* 33* 35*  GLUCOSE 117* 149* 130*  BUN 37* 39* 31*  CREATININE 1.20 1.11 0.91  CALCIUM 9.5 8.7* 8.9   Liver Function Tests: No results for input(s): AST, ALT, ALKPHOS, BILITOT, PROT, ALBUMIN in the last 168 hours. No results for input(s): LIPASE, AMYLASE in the last 168 hours. No results for input(s): AMMONIA in the last 168 hours. CBC:  Recent Labs Lab 08/06/16 1555 08/07/16 0325 08/08/16 0602  WBC 15.5* 14.5* 16.7*  NEUTROABS 12.8* 13.4*  --   HGB 17.2* 15.6 16.3  HCT 52.4* 47.1  49.8  MCV 95.4 93.3 94.7  PLT 256 227 240   Cardiac Enzymes:  Recent Labs Lab 08/06/16 1555 08/06/16 2147 08/07/16 0325  TROPONINI 0.07* 0.06* 0.06*   BNP: BNP (last 3 results)  Recent Labs  07/29/16 0126 08/06/16 1555  BNP 746.0* 165.0*    ProBNP (last 3 results) No results for input(s): PROBNP in the last 8760 hours.  CBG: No results for input(s): GLUCAP in the last 168 hours.     SignedLelon Frohlich  [facility] Pager: [phone] 08/08/2016, 12:24 PM

## 2016-08-08 NOTE — Evaluation (Signed)
Physical Therapy Evaluation Patient Details Name: Karl Walker MRN: [id number] DOB: Jun 22, 1932 Today's Date: 08/08/2016   History of Present Illness  82 y.o. male with medical history significant for AAA status post endovascular repair, CAD with stent, chronic systolic CHF, COPD with ongoing tobacco abuse, and hypertension who presents to the emergency department at the direction of his PCP for evaluation of respiratory distress with hypoxia. Patient was recently hospitalized, discharged 07/30/2016 after management for acute on chronic systolic CHF. He was discharged with new home O2 and had been doing well with 2 L/m via nasal cannula at that time. He initially did well back at home, but over the past few days has developed severe and progressive dyspnea with exertion. He is accompanied by his wife and daughter who report apparent distress even with repositioning himself in bed. He has not gotten out of bed in the past 2 days due to severe dyspnea with minimal exertion. He was evaluated by home health RN earlier today and noted to be saturating 80% on his 2 L/m of supplemental oxygen. O2 was turned up to 4 and he was able to secure a same day appointment with his PCP. He remained hypoxic at the PCP office despite use of 4 L/m supplemental oxygen and was directed to the ED. Patient has denied any fevers or chills and does not have any significant cough. He reports losing close to 10 pounds since time of hospital discharge and has not had much of an appetite. He denies any chest pain or palpitations and denies headache, confusion, or focal numbness or weakness.  Dx: CAP.    Clinical Impression  Pt received in bed, and was agreeable to PT evaluation.  Pt has a difficult time answering PLOF questions, and requires increased time to answer questions.  Pt sometimes does not answer the question with appropriate answer. 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  He transferred sit<>stand with RW and ambulated with RW and supervision.  He was limited due to fatigue with SpO2 found to be 82% on RA, but improved to 90% on 2L after 1 min.  At this point, pt would benefit from HHPT, as well as 24/7 supervision/assistance upon d/c.      Follow Up Recommendations Home health PT;Supervision/Assistance - 24 hour    Equipment Recommendations  None recommended by PT    Recommendations for Other Services       Precautions / Restrictions Precautions Precautions: Fall Precaution Comments: Due to decreased cognition.  Restrictions Weight Bearing Restrictions: No      Mobility  Bed Mobility Overal bed mobility: Independent                Transfers Overall transfer level: Modified independent Equipment used: Rolling walker (2 wheeled)                Ambulation/Gait Ambulation/Gait assistance: Supervision;Modified independent (Device/Increase time) Ambulation Distance (Feet): 100 Feet Assistive device: Rolling walker (2 wheeled) Gait Pattern/deviations: Step-through pattern     General Gait Details: Vc's for upright posture, and limited distance due to fatigue with SpO2 noted to desaturate down to 82% on RA.  This improved to 90% with seated rest and 2L after ~74min   Stairs  Wheelchair Mobility    Modified Rankin (Stroke Patients Only)       Balance Overall balance assessment: Needs assistance         Standing balance support: Bilateral upper extremity supported Standing balance-Leahy Scale: Good                               Pertinent Vitals/Pain Pain Assessment: No/denies pain    Home Living   Living Arrangements: Spouse/significant other   Type of Home: House Home Access: Stairs to enter    [employer] of Steps: front porch 8 no HR, Back deck with several stesp - pt not able to specify.   Home Layout: One level Home Equipment: Walker - 2 wheels;Cane - single point;Bedside commode      Prior Function Level of Independence: Needs assistance   Gait / Transfers Assistance Needed: Pt ambulates with the RW.    ADL's / Homemaking Assistance Needed: pt states he can dress independently, as well as bathing, however this is highly doubtful.          Hand Dominance   Dominant Hand: Right    Extremity/Trunk Assessment                         Communication   Communication: HOH (slowed to respond. )  Cognition Arousal/Alertness: Awake/alert Behavior During Therapy: WFL for tasks assessed/performed Overall Cognitive Status: Impaired/Different from baseline (States he is in the upstairs master bedroom, and he is here to "check on someone."   states it is September or October of 2018.) Area of Impairment: Orientation Orientation Level: Disoriented to;Place;Situation                  General Comments      Exercises     Assessment/Plan    PT Assessment Patient needs continued PT services  PT Problem List Decreased activity tolerance;Decreased mobility;Decreased safety awareness;Cardiopulmonary status limiting activity          PT Treatment Interventions Functional mobility training;Therapeutic activities;Balance training;Patient/family education;Gait training    PT Goals (Current goals can be found in the Care Plan section)  Acute Rehab PT Goals PT Goal Formulation: Patient unable to participate in goal setting Time For Goal Achievement: 08/15/16 Potential to Achieve Goals: Fair    Frequency Min 2X/week   Barriers to discharge   Unsure of caregiver support.  Pt states he lives with his wife - however, CM states that his wife is caring for another sick family member.      Co-evaluation               End of Session Equipment  Utilized During Treatment: Gait belt;Oxygen Activity Tolerance: Patient tolerated treatment well;Patient limited by fatigue Patient left: in chair;with call bell/phone within reach;with nursing/sitter in room Nurse Communication: Mobility status Jacqlyn Larsen, RN notified of pt's mobility status. )    Functional Assessment Tool Used: [place] "6-clicks" Functional Limitation: Mobility: Walking and moving around Mobility: Walking and Moving Around Current Status [phone]): At least 20 percent but less than 40 percent impaired, limited or restricted Mobility: Walking and Moving Around Goal Status [phone]): At least 1 percent but less than 20 percent impaired, limited or restricted    Time: 1231-1303 PT Time Calculation (min) (ACUTE ONLY): 32 min   Charges:   PT Evaluation $PT Eval Low Complexity: 1 Procedure PT Treatments $Gait Training: 8-22 mins   PT G Codes:  PT G-Codes **NOT FOR INPATIENT CLASS** Functional Assessment Tool Used: [employer] "6-clicks" Functional Limitation: Mobility: Walking and moving around Mobility: Walking and Moving Around Current Status [phone]): At least 20 percent but less than 40 percent impaired, limited or restricted Mobility: Walking and Moving Around Goal Status [phone]): At least 1 percent but less than 20 percent impaired, limited or restricted    Beth Tangie Stay, PT, DPT X: [phone]

## 2016-08-08 NOTE — Consult Note (Signed)
   [facility] CM Inpatient Consult   08/08/2016  Karl Walker [birth year] [id number]  Spoke with patient daughter-in-law, Gregori Okelley, over phone regarding brochure given to patient family yesterday. She states the family would like to participate in our community based chronic disease care management services as a benefit of patient's [employer].  Reviewed that patient will receive post hospital discharge call and will be evaluated for monthly home visits.  Will refer to both [place] and LCSW. Of note, [facility] Care Management services does not replace or interfere with any services that are arranged by inpatient case management or social work.    For additional questions or referrals please contact:   Royetta Crochet. Laymond Purser, RN, BSN, [facility] Liaison [phone]

## 2016-08-08 NOTE — Clinical Social Work Note (Signed)
Clinical Social Work Assessment  Patient Details  Name: ZAREN DUSCH MRN: [id number] Date of Birth: 05/05/1932  Date of referral:  08/08/16               Reason for consult:  Discharge Planning                Permission sought to share information with:  Family Supports Permission granted to share information::  Yes, Verbal Permission Granted  Name::     Sports coach::     Relationship::  daughter-in-law  Contact Information:     Housing/Transportation Living arrangements for the past 2 months:  Single Family Home Source of Information:  Spouse, Other (Comment Required) (Daughter-in-law) Patient Interpreter Needed:  None Criminal Activity/Legal Involvement Pertinent to Current Situation/Hospitalization:  No - Comment as needed Significant Relationships:  Adult Children, Spouse Lives with:  Spouse Do you feel safe going back to the place where you live?   (Family feels pt is being d/c too soon or should go to SNF) Need for family participation in patient care:  Yes (Comment)  Care giving concerns:  Pt's wife is currently sick per family.    Social Worker assessment / plan:  CSW spoke with pt's wife on phone as pt is oriented to self only. After introduction, pt's wife immediately requested that CSW call her daughter-in-law, Helene Kelp to discuss d/c planning. Helene Kelp states that pt lives with his wife and he has been in hospital twice in last few weeks. Family was requesting SNF. PT evaluated pt and recommend home health/supervision. CSW shared recommendation and that pt ambulated 100 feet. Pt is d/c today. Discussed private pay SNF/ALF placement. Helene Kelp states that pt will not qualify for Medicaid and they do not want to use financial resources/assets to pay for placement. Helene Kelp said that concern is when pt returns home he refuses to do anything, doesn't want to eat, wear oxygen, or take medications. He will work with PT at home, and then only gets up to go to the bathroom. Discussed in  detail skilled needs and that pt does not meet this criteria at this time. Helene Kelp asked about [facility] that was referred. CSW left voicemail for [facility] requesting family be contacted. Pt will return home. CSW signing off.    Employment status:  Retired Nurse, adult PT Recommendations:  Home with [place], [place] / Referral to community resources:  Other (Comment Required) (SNF/ALF lists)  Patient/Family's Response to care:  Family feels that pt would benefit from SNF, but does not want to pay privately due to no skilled need.   Patient/Family's Understanding of and Emotional Response to Diagnosis, Current Treatment, and Prognosis:  Helene Kelp appears to be knowledgeable of pt's medical history. They are concerned about pt's noncompliance when he returns home.   Emotional Assessment Appearance:  Appears stated age Attitude/Demeanor/Rapport:  Unable to Assess Affect (typically observed):  Unable to Assess Orientation:  Oriented to Self Alcohol / Substance use:  Not Applicable Psych involvement (Current and /or in the community):  No (Comment)  Discharge Needs  Concerns to be addressed:  Discharge Planning Concerns Readmission within the last 30 days:  Yes Current discharge risk:  Other (Family reports pt does not do anything once he returns home) Barriers to Discharge:  No Barriers Identified   Salome Arnt, [place] 08/08/2016, 2:17 PM [phone]

## 2016-08-08 NOTE — Care Management Important Message (Signed)
Important Message  Patient Details  Name: Karl Walker MRN: [id number] Date of Birth: Sep 10, 1932   Medicare Important Message Given:  Yes    Riniyah Speich, [place]un[place]y [place], RN 08/08/2016, 11:30 AM

## 2016-08-09 ENCOUNTER — Other Ambulatory Visit: Payer: Self-pay | Admitting: Licensed Clinical Social Worker

## 2016-08-09 ENCOUNTER — Other Ambulatory Visit: Payer: Self-pay | Admitting: *Deleted

## 2016-08-09 ENCOUNTER — Encounter: Payer: Self-pay | Admitting: *Deleted

## 2016-08-09 LAB — URINE CULTURE

## 2016-08-09 NOTE — Patient Outreach (Addendum)
[facility]) Care Management  08/09/2016  Karl Walker [birth year] [id number]   Assessment- CSW received new referral on patient for community resource assistance. Patient has had two hospital admissions this past week and was recently discharged back home yesterday. CSW completed chart review and saw that daughter in law Helene Kelp spoke with inpatient CSW and was concerned for patient's safety post discharge. Daughter in law felt that patient needs SNF placement and CSW informed family that he does not qualify for service. Inpatient CSW educated family on private pay SNF stay or ALF placement but they declined to do this as family does not wish to use financial assets to pay for placement.   CSW completed call to Ammie Ferrier, [facility] Liaison and received additional information on patient.  CSW completed initial outreach on 08/09/16. CSW unable to reach anyone successfully and could not leave a HIPPA compliant voice message because phone rang continuously.   CSW received call from Janalyn Shy, [place] and discussed case. She reports that she was able to reach patient's daughter in law today and scheduled home visit for next week. She states that she will update CSW as needed.   Plan-CSW will await to hear back from [place] before making any further outreaches.   [name], BSW, MSW, [email or web address] Phone: [phone] Fax: [phone]

## 2016-08-09 NOTE — Patient Outreach (Signed)
[facility]) Care Management  08/09/2016  Karl Walker [birth year] [id number]   Karl Walker a 82 y.o.malewith history of abdominal aortic aneurysm status post endovascular repair, CAD with stent, chronic systolic CHF, COPD with ongoing tobacco abuse, and hypertension. Karl Walker recently was hospitalized for treatment of COPD with acute exacerbation and acute on chronic respiratory failure and CAP. This was his 2nd hospital admission in 1 week. He was previously discharged to home on 07/30/16 after an admission for acute on chronic CHF.   I reached out to Karl Walker and was able to speak with his daughter in law Karl Walker [phone]) who provides most of the oversight and management of Karl Walker health care. She reports that Karl Walker arrived home, has his discharge explanation/after visit summary, has all his prescribed medications including oral steroids and antibiotics (wife administers; Karl Kelp is pharmacist). Karl Walker is using O2 @ 2L/[place] continuously but has to be "constantly reminded" because of his poor short term memory related to dementia. As recommended, Mrs. Tibbs is planning to call Dr. Hilma Favors (primary care provider) today to arrange for a post hospital follow up appointment.   Karl Kelp and I discussed at length today the concerns around level of care for Karl Walker. The family had hoped Karl Walker would discharge to SNF but, as per the inpatient social worker note, he did not qualify for SNF admission. Karl Kelp and I briefly discussed level of care options including SNF admission from home if qualified, increase in level of home care services (out of pocket expense), respite care, and at some point in the future - hospice care, given Karl Walker dementia diagnosis (terminal).   I spoke with [name] LCSW ([place] [facility]) regarding my conversation with the family and she will await the outcome of my initial evaluation in the home with Karl Walker and engage  based on the patient and family wishes for any change in Level of Care or other social work needs as determined during the visit.   Plan: I will see with Karl Walker, his wife, and Karl Kelp on Monday at 11am at Karl Walker's home.    [place] Management  [phone]

## 2016-08-10 ENCOUNTER — Emergency Department (HOSPITAL_COMMUNITY)
Admission: EM | Admit: 2016-08-10 | Discharge: 2016-08-10 | Disposition: A | Discharge status: Home / Self Care | Payer: Commercial Managed Care - HMO | Attending: Emergency Medicine | Admitting: Emergency Medicine

## 2016-08-10 ENCOUNTER — Emergency Department (HOSPITAL_COMMUNITY): Payer: Commercial Managed Care - HMO

## 2016-08-10 ENCOUNTER — Encounter (HOSPITAL_COMMUNITY): Payer: Self-pay | Admitting: Emergency Medicine

## 2016-08-10 DIAGNOSIS — E785 Hyperlipidemia, unspecified: Secondary | ICD-10-CM | POA: Diagnosis not present

## 2016-08-10 DIAGNOSIS — I5022 Chronic systolic (congestive) heart failure: Secondary | ICD-10-CM | POA: Diagnosis not present

## 2016-08-10 DIAGNOSIS — J449 Chronic obstructive pulmonary disease, unspecified: Secondary | ICD-10-CM | POA: Diagnosis not present

## 2016-08-10 DIAGNOSIS — I110 Hypertensive heart disease with heart failure: Secondary | ICD-10-CM | POA: Insufficient documentation

## 2016-08-10 DIAGNOSIS — I2510 Atherosclerotic heart disease of native coronary artery without angina pectoris: Secondary | ICD-10-CM | POA: Diagnosis not present

## 2016-08-10 DIAGNOSIS — R0902 Hypoxemia: Secondary | ICD-10-CM | POA: Diagnosis not present

## 2016-08-10 DIAGNOSIS — R0602 Shortness of breath: Secondary | ICD-10-CM | POA: Diagnosis not present

## 2016-08-10 DIAGNOSIS — I5033 Acute on chronic diastolic (congestive) heart failure: Secondary | ICD-10-CM | POA: Diagnosis not present

## 2016-08-10 DIAGNOSIS — F17210 Nicotine dependence, cigarettes, uncomplicated: Secondary | ICD-10-CM | POA: Diagnosis not present

## 2016-08-10 DIAGNOSIS — Z9981 Dependence on supplemental oxygen: Secondary | ICD-10-CM | POA: Diagnosis not present

## 2016-08-10 DIAGNOSIS — Z79899 Other long term (current) drug therapy: Secondary | ICD-10-CM | POA: Insufficient documentation

## 2016-08-10 DIAGNOSIS — J9601 Acute respiratory failure with hypoxia: Secondary | ICD-10-CM | POA: Diagnosis not present

## 2016-08-10 DIAGNOSIS — J441 Chronic obstructive pulmonary disease with (acute) exacerbation: Secondary | ICD-10-CM | POA: Diagnosis not present

## 2016-08-10 DIAGNOSIS — I252 Old myocardial infarction: Secondary | ICD-10-CM | POA: Diagnosis not present

## 2016-08-10 DIAGNOSIS — R05 Cough: Secondary | ICD-10-CM | POA: Diagnosis not present

## 2016-08-10 DIAGNOSIS — Z7982 Long term (current) use of aspirin: Secondary | ICD-10-CM | POA: Insufficient documentation

## 2016-08-10 LAB — BRAIN NATRIURETIC PEPTIDE: B Natriuretic Peptide: 193 pg/mL — ABNORMAL HIGH (ref 0.0–100.0)

## 2016-08-10 LAB — BASIC METABOLIC PANEL
ANION GAP: 4 — AB (ref 5–15)
BUN: 35 mg/dL — ABNORMAL HIGH (ref 6–20)
CALCIUM: 8.7 mg/dL — AB (ref 8.9–10.3)
CHLORIDE: 101 mmol/L (ref 101–111)
CO2: 36 mmol/L — AB (ref 22–32)
Creatinine, Ser: 1.04 mg/dL (ref 0.61–1.24)
GFR calc non Af Amer: >60 mL/min (ref >60)
Glucose, Bld: 118 mg/dL — ABNORMAL HIGH (ref 65–99)
POTASSIUM: 3.3 mmol/L — AB (ref 3.5–5.1)
Sodium: 141 mmol/L (ref 135–145)

## 2016-08-10 LAB — CBC WITH DIFFERENTIAL/PLATELET
BASOS ABS: 0 10*3/uL (ref 0.0–0.1)
Basophils Relative: 0 %
EOS ABS: 0 10*3/uL (ref 0.0–0.7)
Eosinophils Relative: 0 %
HEMATOCRIT: 46.1 % (ref 39.0–52.0)
HEMOGLOBIN: 15 g/dL (ref 13.0–17.0)
LYMPHS PCT: 10 %
Lymphs Abs: 1.3 10*3/uL (ref 0.7–4.0)
MCH: 30.9 pg (ref 26.0–34.0)
MCHC: 32.5 g/dL (ref 30.0–36.0)
MCV: 95.1 fL (ref 78.0–100.0)
MONOS PCT: 5 %
Monocytes Absolute: 0.6 10*3/uL (ref 0.1–1.0)
NEUTROS PCT: 85 %
Neutro Abs: 10.9 10*3/uL — ABNORMAL HIGH (ref 1.7–7.7)
Platelets: 270 10*3/uL (ref 150–400)
RBC: 4.85 MIL/uL (ref 4.22–5.81)
RDW: 14.3 % (ref 11.5–15.5)
WBC: 12.8 10*3/uL — ABNORMAL HIGH (ref 4.0–10.5)

## 2016-08-10 MED ORDER — ALBUTEROL SULFATE (2.5 MG/3ML) 0.083% IN NEBU
5.0000 mg | INHALATION_SOLUTION | Freq: Once | RESPIRATORY_TRACT | Status: AC
  Administered 2016-08-10: 5 mg via RESPIRATORY_TRACT

## 2016-08-10 MED ORDER — ALBUTEROL SULFATE (2.5 MG/3ML) 0.083% IN NEBU
INHALATION_SOLUTION | RESPIRATORY_TRACT | Status: AC
  Filled 2016-08-10: qty 3

## 2016-08-10 NOTE — Care Management (Signed)
CSW consult placed. However, I did speak with Dr. Rogene Houston, as I was called by secretary and I know patient's situation well from recent admission. I explained that daughter Lisabeth Pick has a list of private duty agencies given last admission of two days ago to help care for the patient. Patient does have Home health services of RN and PT. Also has home oxygen. Patient was recommended by physical therapy for [facility] PT on 08/08/2016, therefore SNF/ALF would have to be privately paid. Family is aware of these options. Will sign off.

## 2016-08-10 NOTE — Discharge Instructions (Signed)
Follow-up with specialist as arranged at last discharge. Would recommend increasing the oxygen to 4 L at all times. Today's workup without any significant changes compared to that time of discharge a few days ago. Return for any new or worse symptoms.

## 2016-08-10 NOTE — ED Notes (Signed)
Spoke with Montez Morita at [facility] return call.

## 2016-08-10 NOTE — ED Notes (Signed)
Spoke with Leveda Anna from [place], transferred call to Dr. Rogene Houston.

## 2016-08-10 NOTE — ED Triage Notes (Addendum)
Pt brought in via [facility], per EMS home health nurse reported shortness of breath at time of her arrival. Home health nurse administered albuterol treatment.  EMS reported at time of their arrival pt denied any shortness of breath or pain.  Pt denies any recent cough or pain at this time. No dyspnea noted. Pt on home o2 as needed.

## 2016-08-10 NOTE — Progress Notes (Signed)
Long conversation with pt's DIL, [name], re: pt/disposition.  Per [name], pt's wife is having a hard time managing pt at home due to his non-compliance with his medications, refusal to eat, and refusal to get OOB.  Essentially, family is reluctant to utilize pt's assets to pay for care for him, because of his wife's future economic needs.  Pt would not qualify for SNF benefits under his managed care Medicare plan and, per [name], has too many assets to qualify for (SA or LTC Medicaid) at this time.  Possible facility respite care suggested and refused, as well. Pt currently has [facility] in place for home health.   Family is planning to meet with [facility] CM Janalyn Shy on 10/23 to discuss further options for pt's care.

## 2016-08-10 NOTE — ED Notes (Signed)
Ambulated patient with O2 and Pulse ox. O2 sats dropped to 87%, pt placed back in seated position on bed, sats remained about 87% for approximately 3-5 minutes with labored respirations.

## 2016-08-10 NOTE — ED Provider Notes (Signed)
[facility] DEPT Provider Note   CSN: [id number] Arrival date & time: 08/10/16  1116     History   Chief Complaint Chief Complaint  Patient presents with  . Shortness of Breath    HPI Karl Walker is a 82 y.o. male.  Patient with recent discharge from the hospital on Wednesday. Was admitted October 16. Patient was admitted that time for COPD exacerbation. Today home nurse was there an option sats were in the 80% range. However patient appeared to be in no distress. EMS called. Patient was noted to be wheezing. Given a breathing treatment by them arrived here still wheezing a second breathing treatment. Wheezing then resolved. Patient's to be on prednisone following his recent admission. And is asked to use a nebulizer treatments at home with his question about compliance. Family members feel that he is too difficult to care for at home mostly due to his dementia and are asking for placement or admission. They state the patient stays in bed all the time does not follow any of the regimens that he supposed to be doing and they're having difficulty getting him to do any of that.      Past Medical History:  Diagnosis Date  . Abdominal aneurysm ([place])    scheduled procedure to take care of this 07/04/12 with Dr.Brabham  . Benign prostatic hyperplasia with urinary obstruction 12/09/2013  . Bladder diverticulum   . Bronchitis   . Bruises easily   . CAD (coronary artery disease) stent in 2007  . Cancer ([place])    basal cell ca on face  . Chest congestion    occasionally per pt and wife  . COPD (chronic obstructive pulmonary disease) ([place])    PT IS A SMOKER  . Dry skin   . Enlarged prostate    takes Proscar  . Hx of colonic polyps   . Hypercholesterolemia    takes Niacin nightly  . Hypertension    takes Hyzaar daily  . MI (myocardial infarction) 1996 and 2006   . Pneumonia    last time 47yrs ago  . Shortness of breath    with exertion  . Tracheal mass    s/p endoscopic  resection  . Urinary frequency     Patient Active Problem List   Diagnosis Date Noted  . Chronic systolic CHF (congestive heart failure) ([place]) 08/06/2016  . CAP (community acquired pneumonia) 08/06/2016  . COPD exacerbation ([place]) 07/29/2016  . Hyponatremia 07/29/2016  . Acute on chronic respiratory failure with hypoxia and hypercapnia ([place]) 07/29/2016  . COPD (chronic obstructive pulmonary disease) with emphysema ([place]) 08/17/2014  . BPH (benign prostatic hypertrophy) with urinary obstruction 12/10/2013  . Benign prostatic hyperplasia with urinary obstruction 12/09/2013  . Bladder diverticulum 12/09/2013  . Hyperlipidemia 05/31/2013  . HTN (hypertension) 05/31/2013  . Tobacco abuse 05/31/2013  . Unspecified constipation 12/31/2012  . Aftercare following surgery of the circulatory system, [place] 08/04/2012  . Tracheal mass 06/12/2012  . Coronary artery disease 06/12/2012  . Abdominal aneurysm without mention of rupture 07/16/2011  . RECTAL BLEEDING 07/20/2010  . EARLY SATIETY 07/20/2010  . WEIGHT LOSS 07/20/2010  . CHANGE IN BOWELS 07/20/2010    Past Surgical History:  Procedure Laterality Date  . ABDOMINAL AORTIC ANEURYSM REPAIR  07-04-12   EVAR  . COLONOSCOPY    . COLONOSCOPY  Oct 2011   RMR: multiple colon polyps, left-sided diverticula, adenomatous, needs surveillance Oct 2014  . CORONARY ANGIOPLASTY WITH STENT PLACEMENT  2006  . CORONARY ANGIOPLASTY WITH STENT  PLACEMENT  12/17/2006   Stent to the RCA  . CYSTOSCOPY N/A 12/11/2013   Procedure: CYSTOSCOPY with evacuation of clots and fulguration of bleeders. ;  Surgeon: Claybon Jabs, MD;  Location: [facility];  Service: Urology;  Laterality: N/A;  . CYSTOSCOPY W/ RETROGRADES Bilateral 12/10/2013   Procedure: CYSTOSCOPY WITH RETROGRADE PYELOGRAM;  Surgeon: [name], MD;  Location: [facility];  Service: Urology;  Laterality: Bilateral;  . ESOPHAGOGASTRODUODENOSCOPY  Oct 2011   RMR: normal esophagus, J-shaped stomach, mottled gastric  mucosa, bulbar erosions: path minimal chronic gastritis  . HERNIA REPAIR     right inguinal  . NM MYOCAR PERF WALL MOTION  06/18/2012   No ischemia  . SKIN SURGERY    . TRANSURETHRAL RESECTION OF PROSTATE N/A 12/10/2013   Procedure: TRANSURETHRAL RESECTION OF THE PROSTATE (TURP) with bladder biopsy with fulgration;  Surgeon: [name], MD;  Location: [facility];  Service: Urology;  Laterality: N/A;  . TUMOR REMOVAL  2013   esophagus  . US ECHOCARDIOGRAPHY  05/24/2009   mild AI,MR  . VIDEO BRONCHOSCOPY  06/25/2012   Procedure: VIDEO BRONCHOSCOPY;  Surgeon: Melrose Nakayama, MD;  Location: [facility];  Service: Thoracic;  Laterality: N/A;  Using Snare with biopsy       Home Medications    Prior to Admission medications   Medication Sig Start Date End Date Taking? Authorizing Provider  albuterol (PROVENTIL) (2.5 MG/3ML) 0.083% nebulizer solution Take 2.5 mg by nebulization daily as needed for wheezing or shortness of breath. And/or Congestion   Yes Historical Provider, MD  aspirin 325 MG tablet Take 325 mg by mouth daily.   Yes Historical Provider, MD  carvedilol (COREG) 3.125 MG tablet Take 1 tablet (3.125 mg total) by mouth 2 (two) times daily with a meal. 07/30/16  Yes Thurnell Lose, MD  cholecalciferol (VITAMIN D) 1000 UNITS tablet Take 1,000 Units by mouth daily.   Yes Historical Provider, MD  ferrous sulfate 325 (65 FE) MG EC tablet Take 325 mg by mouth daily with breakfast.   Yes Historical Provider, MD  finasteride (PROSCAR) 5 MG tablet Take 5 mg by mouth daily.   Yes Historical Provider, MD  furosemide (LASIX) 40 MG tablet Take 1 tablet (40 mg total) by mouth 2 (two) times daily. 07/30/16  Yes Thurnell Lose, MD  isosorbide mononitrate (IMDUR) 30 MG 24 hr tablet Take 1 tablet (30 mg total) by mouth daily. 07/30/16  Yes Thurnell Lose, MD  levofloxacin (LEVAQUIN) 500 MG tablet Take 1 tablet (500 mg total) by mouth daily. 08/08/16  Yes Erline Hau, MD  losartan (COZAAR) 25  MG tablet Take 1 tablet (25 mg total) by mouth daily. 07/30/16  Yes Thurnell Lose, MD  niacin 500 MG tablet Take 500 mg by mouth daily.    Yes Historical Provider, MD  predniSONE (DELTASONE) 10 MG tablet Take 1 tablet (10 mg total) by mouth daily with breakfast. 08/08/16  Yes Estela Leonie Green, MD  Saw Palmetto 1000 MG CAPS Take 1 capsule by mouth daily.   Yes Historical Provider, MD  spironolactone (ALDACTONE) 25 MG tablet Take 0.5 tablets (12.5 mg total) by mouth daily. 07/30/16  Yes Thurnell Lose, MD    Family History Family History  Problem Relation Age of Onset  . Stroke Brother     Social History Social History  Substance Use Topics  . Smoking status: Current Some Day Smoker    Packs/day: 0.25    Years: 55.00  Types: Cigarettes  . Smokeless tobacco: Never Used  . Alcohol use No     Allergies   Iodine   Review of Systems Review of Systems  Unable to perform ROS: Dementia     Physical Exam Updated Vital Signs BP (!) 150/130   Pulse 75   Temp 98.1 F (36.7 C) (Oral)   Resp 18   Ht 5' 10.5" (1.791 m)   Wt 62.6 kg   SpO2 95%   BMI 19.52 kg/m   Physical Exam  Constitutional: He appears well-developed and well-nourished. No distress.  HENT:  Head: Normocephalic and atraumatic.  Mouth/Throat: Oropharynx is clear and moist.  Eyes: EOM are normal. Pupils are equal, round, and reactive to light.  Neck: Normal range of motion.  Cardiovascular: Normal rate, regular rhythm and normal heart sounds.   No murmur heard. Pulmonary/Chest: Effort normal and breath sounds normal. No respiratory distress. He has no wheezes.  Abdominal: Soft. Bowel sounds are normal. There is no tenderness.  Musculoskeletal: Normal range of motion. He exhibits no edema.  Neurological: He is alert. No cranial nerve deficit. He exhibits normal muscle tone. Coordination normal.  Skin: Skin is warm.  Nursing note and vitals reviewed.    ED Treatments / Results  Labs (all  labs ordered are listed, but only abnormal results are displayed) Labs Reviewed  CBC WITH DIFFERENTIAL/PLATELET - Abnormal; Notable for the following:       Result Value   WBC 12.8 (*)    Neutro Abs 10.9 (*)    All other components within normal limits  BASIC METABOLIC PANEL - Abnormal; Notable for the following:    Potassium 3.3 (*)    CO2 36 (*)    Glucose, Bld 118 (*)    BUN 35 (*)    Calcium 8.7 (*)    Anion gap 4 (*)    All other components within normal limits  BRAIN NATRIURETIC PEPTIDE - Abnormal; Notable for the following:    B Natriuretic Peptide 193.0 (*)    All other components within normal limits    EKG  EKG Interpretation  Date/Time:  Friday August 10 2016 12:45:37 EDT Ventricular Rate:  70 PR Interval:    QRS Duration: 148 QT Interval:  450 QTC Calculation: 486 R Axis:   -99 Text Interpretation:  Sinus rhythm Atrial premature complex RBBB and LAFB Abnormal lateral Q waves Confirmed by Yevette Knust  MD, Nicki Reaper [id number]) on 08/10/2016 4:02:17 PM Also confirmed by Rogene Houston  MD, [place] [phone])  on 08/10/2016 4:02:55 PM       Radiology Dg Chest 2 View  Result Date: 08/10/2016 CLINICAL DATA:  Shortness of breath, coughing congestion, on home O2, history coronary artery disease post MI, hypertension, COPD EXAM: CHEST  2 VIEW COMPARISON:  08/06/2016 FINDINGS: Normal heart size and pulmonary vascularity. Atherosclerotic calcification aorta. Emphysematous and minimal bronchitic changes consistent with COPD. Small LEFT pleural effusion and associated mild basilar atelectasis. Slight chronic accentuation of interstitial markings appears unchanged. No segmental consolidation or pneumothorax. Bones appear demineralized. IMPRESSION: COPD changes with chronic interstitial changes, mild LEFT basilar atelectasis and small LEFT pleural effusion. Aortic atherosclerosis. Electronically Signed   By: Lavonia Dana M.D.   On: 08/10/2016 12:44    Procedures Procedures (including critical  care time)  Medications Ordered in ED Medications  albuterol (PROVENTIL) (2.5 MG/3ML) 0.083% nebulizer solution (  Not Given 08/10/16 1230)  albuterol (PROVENTIL) (2.5 MG/3ML) 0.083% nebulizer solution 5 mg (5 mg Nebulization Given 08/10/16 1230)     Initial  Impression / Assessment and Plan / ED Course  I have reviewed the triage vital signs and the nursing notes.  Pertinent labs & imaging results that were available during my care of the patient were reviewed by me and considered in my medical decision making (see chart for details).  Clinical Course   The patient just discharged from the hospital 2 days ago was admitted October 16 for very similar complaints. Home nurse was there today oxygen sats were apparently in the 80s. Patient had wheezing when EMS arrived. Nebulizer treatment given in route. Patient still with wheezing upon arrival here second nebulizer treatment given. Wheezing resolved. Chest x-ray other than a small pleural effusion without any acute findings. Patient normally on 2 L of oxygen. On that with ambulation his sats went down to 87% the patient was in no distress. Patient bumped up to 4 L of oxygen remained alert seems to do better on that. Discussed with care management and social workers. Family is looking for placement this was all addressed at the time of his discharge. Various options were provided. Family seems not to be totally excepting. But they were reviewed again with them and patient does not qualify at this time for patent nursing home or rehabilitation placement. Family will have the option to place them on their own. Patient will be discharged back home.  Patient's main situation at home seems to be more the dementia he does have a history of COPD as well as CHF. That that's really getting him to comply with his therapies and his breathing treatments. This seems to be the main problem. No evidence of pneumonia today.  Patient although confused remained alert.  Labs without any significant changes compared to his recent discharge.  In addition case was discussed with the hospitalist on call. They reviewed his recent admission and concurred that he wasn't had no indications for admission today. I agree with her conclusions.  Final Clinical Impressions(s) / ED Diagnoses   Final diagnoses:  COPD exacerbation ([place])  Shortness of breath    New Prescriptions Discharge Medication List as of 08/10/2016  6:32 PM       Fredia Sorrow, MD 08/10/16 1912

## 2016-08-13 ENCOUNTER — Other Ambulatory Visit: Payer: Self-pay | Admitting: *Deleted

## 2016-08-13 ENCOUNTER — Encounter: Payer: Self-pay | Admitting: *Deleted

## 2016-08-13 NOTE — Patient Outreach (Signed)
[facility]) Care Management   08/13/2016  Karl Walker 1932-09-07 [id number]  Cherry Mazzaferro Becht is an 82 y.o. male with history of abdominal aortic aneurysm status post endovascular repair, CAD with stent, chronic systolic CHF, COPD with ongoing tobacco abuse, and hypertension. Karl Walker recently was hospitalized for treatment of COPD with acute exacerbation and acute on chronic respiratory failure and CAP. This was his 2nd hospital admission in 1 week. He was previously discharged to home on 07/30/16 after an admission for acute on chronic CHF. He was discharged on 08/08/16 and had an ED visit on Friday 08/10/16.   I am seeing Karl Walker at home today with his wife Otelia Sergeant and daughter in law Harris Hill Slauson [phone]). [name] helps Mr. & Mrs. Walker with all their affairs and sees the couple at home daily.   Subjective: "I guess I'm not as strong as I used to be but I'm doing better."  Objective:  BP 118/70   Pulse 79   Ht 1.778 m (5\' 10" )   Wt 137 lb (62.1 kg)   SpO2 95%   BMI 19.66 kg/m   Review of Systems  Constitutional: Positive for weight loss.       25 pound weight loss over the course of 12 month period per patient/spouse report  HENT: Positive for hearing loss.        Mildly hard of hearing  Eyes: Negative.   Respiratory: Negative for cough, sputum production, shortness of breath and wheezing.   Cardiovascular: Negative for chest pain, palpitations and leg swelling.  Gastrointestinal: Negative.   Genitourinary: Negative.   Musculoskeletal: Positive for joint pain and myalgias. Negative for falls.       Patient reports "aches and pains every once in a while in my muscles and bones"; patient and family deny falls in the last 12 months; state he has had "near misses"  Skin: Negative.   Neurological: Negative for dizziness, tingling, focal weakness and loss of consciousness.  Psychiatric/Behavioral: Positive for memory loss. The patient is not  nervous/anxious.        Affect somewhat flat today; patient and family report more sleeping throughout the day over the course of the last 12 months    Physical Exam  Constitutional: Vital signs are normal. He appears well-developed. He is cooperative. He has a sickly appearance. He does not appear ill. No distress.  Cardiovascular: Normal rate, regular rhythm and normal heart sounds.   Respiratory: Effort normal and breath sounds normal. He has no wheezes. He has no rhonchi. He has no rales.  GI: Soft. Normal appearance and bowel sounds are normal. He exhibits no distension.  Neurological: He is alert.  Skin: Skin is warm, dry and intact.  Psychiatric: His speech is normal. His mood appears not anxious. His affect is not angry. He is slowed and withdrawn. He is not agitated, not aggressive, not hyperactive, not actively hallucinating and not combative. Thought content is not paranoid. He expresses impulsivity and inappropriate judgment. He exhibits abnormal recent memory and abnormal remote memory.  Affect somewhat flat; somewhat withdrawn; difficulty with short term memory He is attentive.    Encounter Medications:   Outpatient Encounter Prescriptions as of 08/13/2016  Medication Sig  . albuterol (PROVENTIL) (2.5 MG/3ML) 0.083% nebulizer solution Take 2.5 mg by nebulization daily as needed for wheezing or shortness of breath. And/or Congestion  . aspirin 325 MG tablet Take 325 mg by mouth daily.  . carvedilol (COREG) 3.125 MG tablet Take 1 tablet (  3.125 mg total) by mouth 2 (two) times daily with a meal.  . cholecalciferol (VITAMIN D) 1000 UNITS tablet Take 1,000 Units by mouth daily.  . ferrous sulfate 325 (65 FE) MG EC tablet Take 325 mg by mouth daily with breakfast.  . finasteride (PROSCAR) 5 MG tablet Take 5 mg by mouth daily.  . furosemide (LASIX) 40 MG tablet Take 1 tablet (40 mg total) by mouth 2 (two) times daily.  . isosorbide mononitrate (IMDUR) 30 MG 24 hr tablet Take 1 tablet  (30 mg total) by mouth daily.  Marland Kitchen levofloxacin (LEVAQUIN) 500 MG tablet Take 1 tablet (500 mg total) by mouth daily.  Marland Kitchen losartan (COZAAR) 25 MG tablet Take 1 tablet (25 mg total) by mouth daily.  . niacin 500 MG tablet Take 500 mg by mouth daily.   . predniSONE (DELTASONE) 10 MG tablet Take 1 tablet (10 mg total) by mouth daily with breakfast.  . Saw Palmetto 1000 MG CAPS Take 1 capsule by mouth daily.  Marland Kitchen spironolactone (ALDACTONE) 25 MG tablet Take 0.5 tablets (12.5 mg total) by mouth daily.   Assessment:  82 year old gentleman living in [place] with his wife who is primary caregiver and who has had a stroke leaving her with visual and processing deficits. Patient has recent history of hospitalization for CHF, COPD, and Pneumonia. His declining cognition has become a severely limiting overlay to his other medical conditions. His wife is having growing difficulty with providing daily hands on care and patient is exhibiting behavior presents fall risk and safety concerns.   Acute/Chronic Health Condition (CHF) - Karl Walker was recently discharged from the hospital after a CHF admission; today, he does not exhibit signs or symptoms of CHF; his breath sounds are clear, he denies chest pain, shortness of breath; he does not have edema; his O2 sat on O2@ 4l/continuous flow is 98%; he is weighing daily and his wife is recording; we reviewed recommendation to call provider for weight gain of 3# overnight or 5# in a week  Acute/Chronic Health Condition (COPD with recent PNA) - Karl Walker's breath sounds are clear today; he does not have cough; he has not had fever; he is taking his medications as prescribed; he is using O2 @ 4l continuously since his discharge from recent ED visit  Level of Care and Home Safety Concerns/Caregiver Burnout - Mrs. Lince provides daily hands on care 7 days a week; she has limitations secondary to her stroke; Karl Walker's daughter in law is very attentive and helps with home and  health management; she sees Mr.& Mrs. Walker daily, sometimes twice, to attend to their needs and ensure their safety.   Mrs. Love and [name] shared with me that Karl Walker's memory and cognition has declined over the last year and in the last 4-6 months has significantly declined. Karl Walker can remember the names of immediate family members but cannot remember names of friends he has known for years. He has difficulty finding things. He has been found on more than one occasion at the top of the basement stairs and caught before falling. Karl Walker recently has been found on a few occasions partially clothed and trying to leave the house at different hours of the day and night. Today, he was unable to tell me the day of the week and couldn't remember details about things that have happened in the last week. Mrs. Schreiter reported, after Karl Walker left the room to go lie down, that Karl Walker has  become more verbally aggressive with her but has never threatened her or made her feel afraid. She says he has never struck her or caused her any physical harm.   Today, we discussed options for ongoing care including home health, private pay in home custodial care, ALF, SNF (short term and long term), respite care, and Adult Day Care services. I reached out to [name], Director for the [employer] in [place] and was notified that Karl Walker will likely qualify for their program and may be able to start in the 2 day/week program once his application has been completed, signed by his primary care provider, and approved. I provided Ms. Cooper's contact information to [name] and notified Mr. & Mrs. Loredo as requested.   Our social work team is following.   Plan:   Grayton Beavin will reach out to [name] at [facility] center to arrange a time to get needed paperwork and meet with [place].   I have spoken with Verdis Frederickson, Patient Care Coordinator at [facility] who will provide updated information to  Mr. Gass's primary care provider, Dr. Hilma Favors.   Karl Walker will see Dr. Hilma Favors in the office on Friday for his scheduled appointment. Karl Walker and [name] will attend.   I will reach out to Mr. & Mrs. Kevorkian and [name] on Monday of next week to follow up on his PCP visit and progress with [facility] application.   [facility] CM Care Plan Problem One   Flowsheet Row Most Recent Value  Care Plan Problem One  Level of Care Concerns  Role Documenting the Problem One  Care Management Coordinator  Care Plan for Problem One  Active  THN Long Term Goal (31-90 days)  Over the next 31 days, patient and/or family will verbalize understanding of options for long term care planning  THN Long Term Goal Start Date  08/09/16  Interventions for Problem One Long Term Goal  Lengthy discussion with family re: current status and level of care needs and level of care options  [facility] CM Short Term Goal #1 (0-30 days)  Over the next 30 days, patient/family will meet with Case Management Team to discuss and investigate appropriate level of care options  [facility] CM Short Term Goal #1 Start Date  08/09/16  Interventions for Short Term Goal #1  Brief discussion of level of care options,  scheduled face to face appointment for further evaluation and discussion  [facility] CM Short Term Goal #2 (0-30 days)  Over the next 3 days, family will meet with [place] Director re: admission to LEAF program  Interventions for Short Term Goal #2  Reached out to [employer] center Mudlogger,  Investment banker, corporate and family,  provided information/guidance re: application    [facility] CM Care Plan Problem Two   Flowsheet Row Most Recent Value  Care Plan Problem Two  Knowledge Deficits related to Chronic Disease self health management  Role Documenting the Problem Two  Care Management Coordinator  Care Plan for Problem Two  Active  Interventions for Problem Two Long Term Goal   Utilizing teachback method, reviewed with patient/spouse/daughter in law importance of  development of self health management plan for CHF  THN Long Term Goal (31-90) days  Over the next 31 days, patient spouse/caregivers will verbalize understanding of plan of care for self health management for CHF  THN Long Term Goal Start Date  08/13/16  [facility] CM Short Term Goal #1 (0-30 days)  Over the next 30 days, patient/spouse/daughter in  law iwll verbalize understanding of signs and symptoms of CHF  THN CM Short Term Goal #1 Start Date  08/13/16  Interventions for Short Term Goal #2   Utilizing teachback method and prescribing Emmi educational materials, provided education re: signs and symptoms of CHF  [facility] CM Short Term Goal #2 (0-30 days)  Over the next 30 days, patient will weigh daily and document and spouse or family will call provider for weight gain of 3# overnight or 5# in a week  [facility] CM Short Term Goal #2 Start Date  08/13/16  Interventions for Short Term Goal #2  Utilizing teachback  method, reviewed with patient/family importance of daily weights and recording, calling provider for weight gain  [facility]N CM Short Term Goal #3 (0-30 days)  Over the next 30 days, patient will take all medications as prescribed  [facility]N CM Short Term Goal #3 Start Date  08/13/16  Interventions for Short Term Goal #3  medication review performed ,  discussed importance of strict adherence to medication regimen    [facility]N CM Care Plan Problem Three   Flowsheet Row Most Recent Value  Care Plan Problem Three  Knowledge Deficits related to COPD/PNA self health management  Role Documenting the Problem Three  Care Management Coordinator  Care Plan for Problem Three  Active  THN Long Term Goal (31-90) days  Over the next 31 days, patient and family will verbalize understanding of long term plan of care for self health management related to COPD  THN Long Term Goal Start Date  08/13/16  Interventions for Problem Three Long Term Goal  Utilizing teachback method and prescribing Emmi educational materials, discussed iwht  patient/family importance of plan of care for self health management of COPD/PNA  [facility] CM Short Term Goal #1 (0-30 days)  Over the next 30 days, patient will wear home O2 at all times, as prescribed  [facility]N CM Short Term Goal #1 Start Date  08/13/16  Interventions for Short Term Goal #1  Utilizing teachback method, discussed with patient and family rationale for and importance of wearing O2 as prescribed 24 hours/day  [facility]N CM Short Term Goal #2 (0-30 days)  Over the  next 30 days, patient/family will verbalize understanding of signs and symptoms of COPD exacerbation and when to call provider to report  [facility] CM Short Term Goal #2 Start Date  08/13/16  Interventions for Short Term Goal #2  Utilizing teachback method and Emmi educatioanal literature, discussed signs and symptoms of COPD exacerbation with patient/family      [place] Management  [phone]

## 2016-08-14 ENCOUNTER — Encounter: Payer: Self-pay | Admitting: *Deleted

## 2016-08-14 ENCOUNTER — Other Ambulatory Visit: Payer: Self-pay | Admitting: Licensed Clinical Social Worker

## 2016-08-14 DIAGNOSIS — I110 Hypertensive heart disease with heart failure: Secondary | ICD-10-CM | POA: Diagnosis not present

## 2016-08-14 DIAGNOSIS — E785 Hyperlipidemia, unspecified: Secondary | ICD-10-CM | POA: Diagnosis not present

## 2016-08-14 DIAGNOSIS — J441 Chronic obstructive pulmonary disease with (acute) exacerbation: Secondary | ICD-10-CM | POA: Diagnosis not present

## 2016-08-14 DIAGNOSIS — J449 Chronic obstructive pulmonary disease, unspecified: Secondary | ICD-10-CM | POA: Diagnosis not present

## 2016-08-14 DIAGNOSIS — I2510 Atherosclerotic heart disease of native coronary artery without angina pectoris: Secondary | ICD-10-CM | POA: Diagnosis not present

## 2016-08-14 DIAGNOSIS — I252 Old myocardial infarction: Secondary | ICD-10-CM | POA: Diagnosis not present

## 2016-08-14 DIAGNOSIS — I5033 Acute on chronic diastolic (congestive) heart failure: Secondary | ICD-10-CM | POA: Diagnosis not present

## 2016-08-14 DIAGNOSIS — Z9981 Dependence on supplemental oxygen: Secondary | ICD-10-CM | POA: Diagnosis not present

## 2016-08-14 DIAGNOSIS — J9601 Acute respiratory failure with hypoxia: Secondary | ICD-10-CM | POA: Diagnosis not present

## 2016-08-14 NOTE — Patient Outreach (Addendum)
[facility]) Care Management  08/14/2016  Karl Walker [birth year] [id number]   Assessment- CSW has been in contact with [place] who completed initial home visit yesterday with patient and family. Patient's cognitive state has declined within the last few months and it is difficult for family to provide ongoing care for him because spouse has health problems as well. Family is in need of personal care resources. Patient's daughter in law Karl Walker is very involved with patient's care and checks on patient daily. [facility] RNCM has been in contact with Biochemist, clinical, [name]. Family is agreeable to referral. Patient will be able to start their 2 day program once application is completed by family. [facility] RNCM is coordinating care between providers and patient. Patient has a scheduled appointment with Dr. Hilma Favors on 08/17/16.  CSW completed outreach to patient's daughter in law Karl Walker but was unable to reach her. CSW left a HIPPA compliant voice message encouraging return call once available.  CSW also spoke to [name] and discussed care and plan of care.   Plan-CSW will follow up within one week.   [name], BSW, MSW, [name] Phone: [phone] Fax: [phone]

## 2016-08-15 DIAGNOSIS — J449 Chronic obstructive pulmonary disease, unspecified: Secondary | ICD-10-CM | POA: Diagnosis not present

## 2016-08-15 DIAGNOSIS — I252 Old myocardial infarction: Secondary | ICD-10-CM | POA: Diagnosis not present

## 2016-08-15 DIAGNOSIS — I2510 Atherosclerotic heart disease of native coronary artery without angina pectoris: Secondary | ICD-10-CM | POA: Diagnosis not present

## 2016-08-15 DIAGNOSIS — I110 Hypertensive heart disease with heart failure: Secondary | ICD-10-CM | POA: Diagnosis not present

## 2016-08-15 DIAGNOSIS — J441 Chronic obstructive pulmonary disease with (acute) exacerbation: Secondary | ICD-10-CM | POA: Diagnosis not present

## 2016-08-15 DIAGNOSIS — Z9981 Dependence on supplemental oxygen: Secondary | ICD-10-CM | POA: Diagnosis not present

## 2016-08-15 DIAGNOSIS — I5033 Acute on chronic diastolic (congestive) heart failure: Secondary | ICD-10-CM | POA: Diagnosis not present

## 2016-08-15 DIAGNOSIS — E785 Hyperlipidemia, unspecified: Secondary | ICD-10-CM | POA: Diagnosis not present

## 2016-08-15 DIAGNOSIS — J9601 Acute respiratory failure with hypoxia: Secondary | ICD-10-CM | POA: Diagnosis not present

## 2016-08-16 ENCOUNTER — Encounter: Payer: Self-pay | Admitting: Licensed Clinical Social Worker

## 2016-08-16 ENCOUNTER — Other Ambulatory Visit: Payer: Self-pay | Admitting: *Deleted

## 2016-08-16 DIAGNOSIS — Z9981 Dependence on supplemental oxygen: Secondary | ICD-10-CM | POA: Diagnosis not present

## 2016-08-16 DIAGNOSIS — E785 Hyperlipidemia, unspecified: Secondary | ICD-10-CM | POA: Diagnosis not present

## 2016-08-16 DIAGNOSIS — I252 Old myocardial infarction: Secondary | ICD-10-CM | POA: Diagnosis not present

## 2016-08-16 DIAGNOSIS — I110 Hypertensive heart disease with heart failure: Secondary | ICD-10-CM | POA: Diagnosis not present

## 2016-08-16 DIAGNOSIS — J9601 Acute respiratory failure with hypoxia: Secondary | ICD-10-CM | POA: Diagnosis not present

## 2016-08-16 DIAGNOSIS — I5033 Acute on chronic diastolic (congestive) heart failure: Secondary | ICD-10-CM | POA: Diagnosis not present

## 2016-08-16 DIAGNOSIS — J441 Chronic obstructive pulmonary disease with (acute) exacerbation: Secondary | ICD-10-CM | POA: Diagnosis not present

## 2016-08-16 DIAGNOSIS — I2510 Atherosclerotic heart disease of native coronary artery without angina pectoris: Secondary | ICD-10-CM | POA: Diagnosis not present

## 2016-08-16 DIAGNOSIS — J449 Chronic obstructive pulmonary disease, unspecified: Secondary | ICD-10-CM | POA: Diagnosis not present

## 2016-08-16 NOTE — Patient Outreach (Signed)
[facility]) Care Management  08/16/2016  Karl Walker 12-Aug-1932 [id number]  I reached out to [place] today to follow up on requests for information about options related to [name] need for O2 during his outings to the [facility]. He is currently on O2 @ 4l/[place] continuously and has a concentrator and cylinders at home. There is no concentrator at the [facility] and the patient is unable to manage the large cylinder. The [facility] staff can provide assistance with the tanks but needs some assurance that [name] will be able to have enough tanks to bring with him for a full 8 hour day.   [facility] department informed me that [name] would be eligible for a lighter weight tank (light weight with shoulder strap) if he were using a pulse flow system that delivers only 2-3L. However, beyond a 3l/pulse, his only option is a traditional cylinder or concentrator.   I have reached out to Dr. Delanna Ahmadi office re: this issue for his input about [name]'s O2 needs.   Plan: I will follow up with Verdis Frederickson, Patient Care Coordinator at [place] Management  [phone]

## 2016-08-17 DIAGNOSIS — J449 Chronic obstructive pulmonary disease, unspecified: Secondary | ICD-10-CM | POA: Diagnosis not present

## 2016-08-17 DIAGNOSIS — J189 Pneumonia, unspecified organism: Secondary | ICD-10-CM | POA: Diagnosis not present

## 2016-08-17 DIAGNOSIS — I2510 Atherosclerotic heart disease of native coronary artery without angina pectoris: Secondary | ICD-10-CM | POA: Diagnosis not present

## 2016-08-17 DIAGNOSIS — Z1389 Encounter for screening for other disorder: Secondary | ICD-10-CM | POA: Diagnosis not present

## 2016-08-17 DIAGNOSIS — Z6820 Body mass index (BMI) 20.0-20.9, adult: Secondary | ICD-10-CM | POA: Diagnosis not present

## 2016-08-17 DIAGNOSIS — E876 Hypokalemia: Secondary | ICD-10-CM | POA: Diagnosis not present

## 2016-08-17 DIAGNOSIS — E441 Mild protein-calorie malnutrition: Secondary | ICD-10-CM | POA: Diagnosis not present

## 2016-08-20 ENCOUNTER — Encounter: Payer: Self-pay | Admitting: *Deleted

## 2016-08-20 ENCOUNTER — Other Ambulatory Visit: Payer: Self-pay | Admitting: *Deleted

## 2016-08-20 DIAGNOSIS — E785 Hyperlipidemia, unspecified: Secondary | ICD-10-CM | POA: Diagnosis not present

## 2016-08-20 DIAGNOSIS — J441 Chronic obstructive pulmonary disease with (acute) exacerbation: Secondary | ICD-10-CM | POA: Diagnosis not present

## 2016-08-20 DIAGNOSIS — J449 Chronic obstructive pulmonary disease, unspecified: Secondary | ICD-10-CM | POA: Diagnosis not present

## 2016-08-20 DIAGNOSIS — I5033 Acute on chronic diastolic (congestive) heart failure: Secondary | ICD-10-CM | POA: Diagnosis not present

## 2016-08-20 DIAGNOSIS — Z9981 Dependence on supplemental oxygen: Secondary | ICD-10-CM | POA: Diagnosis not present

## 2016-08-20 DIAGNOSIS — J9601 Acute respiratory failure with hypoxia: Secondary | ICD-10-CM | POA: Diagnosis not present

## 2016-08-20 DIAGNOSIS — I252 Old myocardial infarction: Secondary | ICD-10-CM | POA: Diagnosis not present

## 2016-08-20 DIAGNOSIS — I2510 Atherosclerotic heart disease of native coronary artery without angina pectoris: Secondary | ICD-10-CM | POA: Diagnosis not present

## 2016-08-20 DIAGNOSIS — I110 Hypertensive heart disease with heart failure: Secondary | ICD-10-CM | POA: Diagnosis not present

## 2016-08-20 NOTE — Patient Outreach (Signed)
[facility] ([facility]) Care Management  08/20/2016  Shandell R Rodriques 10/26/1931 [id number]  Dallan R Kamer is an 82 y.o. male with history of abdominal aortic aneurysm status post endovascular repair, CAD with stent, chronic systolic CHF, COPD with ongoing tobacco abuse, and hypertension. Mr. Cashin recently was hospitalized for treatment of COPD with acute exacerbation and acute on chronic respiratory failure and CAP. This was his 2nd hospital admission in 1 week. He was previously discharged to home on 07/30/16 after an admission for acute on chronic CHF. He was discharged on 08/08/16 and had an ED visit on Friday 08/10/16.   I met with Mr. Novicki and his wife and daughter in law in person last week. As per Mr. Coventry and his family's decision, I reached out to the [facility]AF Center Director to inquire about program availability and appropriateness. Mr. Eveland has been accepted into the program. He has an appointment at [facility] on Friday to explore O2 delivery systems that would allow him to visit the [facility]AF Center for as many as 8 hours. The current plan is to start the program 2 days/week with an option to increase days of week as needed/funds permit.   Mr. Boxwell attended a scheduled appointment with his primary care provider on Friday. His daily Lasix dose was decreased from 80mg to 40mg.   Per Mrs. Cournoyer and Mr. Sherard's daughter in law, Mr. Mcginn has been symptom free (respiratory) and has had no falls. He continues to demonstrate poor short term memory and mild to moderate confusion daily. Per Mr. Wagenaar's daughter in law, primary care is to refer Mr. Gildner for evaluation of cognitive status.   Plan: I will follow up with Mr. Hairfield and his family by phone next week, assisting with any clinical or care coordination needs.    Alisa Gilboy MHA,BSN,RN,CCM [facility] Care Management  [phone]      

## 2016-08-21 ENCOUNTER — Other Ambulatory Visit: Payer: Self-pay | Admitting: Licensed Clinical Social Worker

## 2016-08-21 DIAGNOSIS — I110 Hypertensive heart disease with heart failure: Secondary | ICD-10-CM | POA: Diagnosis not present

## 2016-08-21 DIAGNOSIS — J441 Chronic obstructive pulmonary disease with (acute) exacerbation: Secondary | ICD-10-CM | POA: Diagnosis not present

## 2016-08-21 DIAGNOSIS — J449 Chronic obstructive pulmonary disease, unspecified: Secondary | ICD-10-CM | POA: Diagnosis not present

## 2016-08-21 DIAGNOSIS — E785 Hyperlipidemia, unspecified: Secondary | ICD-10-CM | POA: Diagnosis not present

## 2016-08-21 DIAGNOSIS — Z9981 Dependence on supplemental oxygen: Secondary | ICD-10-CM | POA: Diagnosis not present

## 2016-08-21 DIAGNOSIS — I5033 Acute on chronic diastolic (congestive) heart failure: Secondary | ICD-10-CM | POA: Diagnosis not present

## 2016-08-21 DIAGNOSIS — I252 Old myocardial infarction: Secondary | ICD-10-CM | POA: Diagnosis not present

## 2016-08-21 DIAGNOSIS — I2510 Atherosclerotic heart disease of native coronary artery without angina pectoris: Secondary | ICD-10-CM | POA: Diagnosis not present

## 2016-08-21 DIAGNOSIS — J9601 Acute respiratory failure with hypoxia: Secondary | ICD-10-CM | POA: Diagnosis not present

## 2016-08-21 NOTE — Patient Outreach (Signed)
[facility]) Care Management  08/21/2016  Karl Walker 10-27-1931 [id number]   Assessment- CSW continues to receive updates from [place]. Patient attended his provider appointment last week and was accepted into LEAF program. Patient will start attending LEAF program 2 days per week for up to 8 hours per day free of charge.   Plan-CSW will continue to coordinate with [facility] and will follow up within two weeks.  [name], BSW, MSW, [email or web address] Phone: [phone] Fax: [phone]

## 2016-08-22 DIAGNOSIS — I5033 Acute on chronic diastolic (congestive) heart failure: Secondary | ICD-10-CM | POA: Diagnosis not present

## 2016-08-22 DIAGNOSIS — J449 Chronic obstructive pulmonary disease, unspecified: Secondary | ICD-10-CM | POA: Diagnosis not present

## 2016-08-22 DIAGNOSIS — I2510 Atherosclerotic heart disease of native coronary artery without angina pectoris: Secondary | ICD-10-CM | POA: Diagnosis not present

## 2016-08-22 DIAGNOSIS — Z9981 Dependence on supplemental oxygen: Secondary | ICD-10-CM | POA: Diagnosis not present

## 2016-08-22 DIAGNOSIS — J441 Chronic obstructive pulmonary disease with (acute) exacerbation: Secondary | ICD-10-CM | POA: Diagnosis not present

## 2016-08-22 DIAGNOSIS — I110 Hypertensive heart disease with heart failure: Secondary | ICD-10-CM | POA: Diagnosis not present

## 2016-08-22 DIAGNOSIS — E785 Hyperlipidemia, unspecified: Secondary | ICD-10-CM | POA: Diagnosis not present

## 2016-08-22 DIAGNOSIS — I252 Old myocardial infarction: Secondary | ICD-10-CM | POA: Diagnosis not present

## 2016-08-22 DIAGNOSIS — J9601 Acute respiratory failure with hypoxia: Secondary | ICD-10-CM | POA: Diagnosis not present

## 2016-08-23 ENCOUNTER — Other Ambulatory Visit: Payer: Self-pay | Admitting: *Deleted

## 2016-08-23 DIAGNOSIS — Z9981 Dependence on supplemental oxygen: Secondary | ICD-10-CM | POA: Diagnosis not present

## 2016-08-23 DIAGNOSIS — I5033 Acute on chronic diastolic (congestive) heart failure: Secondary | ICD-10-CM | POA: Diagnosis not present

## 2016-08-23 DIAGNOSIS — J449 Chronic obstructive pulmonary disease, unspecified: Secondary | ICD-10-CM | POA: Diagnosis not present

## 2016-08-23 DIAGNOSIS — E785 Hyperlipidemia, unspecified: Secondary | ICD-10-CM | POA: Diagnosis not present

## 2016-08-23 DIAGNOSIS — J441 Chronic obstructive pulmonary disease with (acute) exacerbation: Secondary | ICD-10-CM | POA: Diagnosis not present

## 2016-08-23 DIAGNOSIS — I110 Hypertensive heart disease with heart failure: Secondary | ICD-10-CM | POA: Diagnosis not present

## 2016-08-23 DIAGNOSIS — J9601 Acute respiratory failure with hypoxia: Secondary | ICD-10-CM | POA: Diagnosis not present

## 2016-08-23 DIAGNOSIS — I252 Old myocardial infarction: Secondary | ICD-10-CM | POA: Diagnosis not present

## 2016-08-23 DIAGNOSIS — I2510 Atherosclerotic heart disease of native coronary artery without angina pectoris: Secondary | ICD-10-CM | POA: Diagnosis not present

## 2016-08-23 NOTE — Patient Outreach (Signed)
[facility]) Care Management  08/23/2016  Karl Walker 11-24-1931 [id number]  Message received from [name], [place] in [place] stating some documentation was needed from the primary care provider prior to Mr. Pooley being approved for admission to the program. I dropped by the [facility] to pick up needed documents and delivered to Verdis Frederickson, patient care coordinator at [facility] (Dr. Hilma Favors) requesting completion and return to [place] at the [facility]   Plan: I will follow up with Mr. Pupo and his family next week regarding ongoing care coordination and clinical assessment needs.    [place] Management  [phone]

## 2016-08-27 ENCOUNTER — Other Ambulatory Visit: Payer: Self-pay | Admitting: *Deleted

## 2016-08-27 DIAGNOSIS — J441 Chronic obstructive pulmonary disease with (acute) exacerbation: Secondary | ICD-10-CM | POA: Diagnosis not present

## 2016-08-27 DIAGNOSIS — J9601 Acute respiratory failure with hypoxia: Secondary | ICD-10-CM | POA: Diagnosis not present

## 2016-08-27 DIAGNOSIS — I5033 Acute on chronic diastolic (congestive) heart failure: Secondary | ICD-10-CM | POA: Diagnosis not present

## 2016-08-27 DIAGNOSIS — I252 Old myocardial infarction: Secondary | ICD-10-CM | POA: Diagnosis not present

## 2016-08-27 DIAGNOSIS — J449 Chronic obstructive pulmonary disease, unspecified: Secondary | ICD-10-CM | POA: Diagnosis not present

## 2016-08-27 DIAGNOSIS — I110 Hypertensive heart disease with heart failure: Secondary | ICD-10-CM | POA: Diagnosis not present

## 2016-08-27 DIAGNOSIS — Z9981 Dependence on supplemental oxygen: Secondary | ICD-10-CM | POA: Diagnosis not present

## 2016-08-27 DIAGNOSIS — E785 Hyperlipidemia, unspecified: Secondary | ICD-10-CM | POA: Diagnosis not present

## 2016-08-27 DIAGNOSIS — I2510 Atherosclerotic heart disease of native coronary artery without angina pectoris: Secondary | ICD-10-CM | POA: Diagnosis not present

## 2016-08-27 NOTE — Patient Outreach (Signed)
[facility]) Care Management  08/27/2016  Dwij Foshee Wilbert [birth year] [id number]  Reached out to Verdis Frederickson, patient care coordinator at [facility] regarding medical clearance form to be signed by Dr. Hilma Favors and orders for [place] to assist with placement of nebulizer machine and supplies at [facility] center for Mr. Raabe's use during participation in program.   Plan: I will reach out to Mr. Reede and his family later this week to follow up on plans for starting [place] day program and to follow up on COPD assessment and needs.    [place] Management  [phone]

## 2016-08-28 DIAGNOSIS — J441 Chronic obstructive pulmonary disease with (acute) exacerbation: Secondary | ICD-10-CM | POA: Diagnosis not present

## 2016-08-28 DIAGNOSIS — J449 Chronic obstructive pulmonary disease, unspecified: Secondary | ICD-10-CM | POA: Diagnosis not present

## 2016-08-28 DIAGNOSIS — J9601 Acute respiratory failure with hypoxia: Secondary | ICD-10-CM | POA: Diagnosis not present

## 2016-08-28 DIAGNOSIS — I5033 Acute on chronic diastolic (congestive) heart failure: Secondary | ICD-10-CM | POA: Diagnosis not present

## 2016-08-28 DIAGNOSIS — E785 Hyperlipidemia, unspecified: Secondary | ICD-10-CM | POA: Diagnosis not present

## 2016-08-28 DIAGNOSIS — I2510 Atherosclerotic heart disease of native coronary artery without angina pectoris: Secondary | ICD-10-CM | POA: Diagnosis not present

## 2016-08-28 DIAGNOSIS — I252 Old myocardial infarction: Secondary | ICD-10-CM | POA: Diagnosis not present

## 2016-08-28 DIAGNOSIS — I110 Hypertensive heart disease with heart failure: Secondary | ICD-10-CM | POA: Diagnosis not present

## 2016-08-28 DIAGNOSIS — Z9981 Dependence on supplemental oxygen: Secondary | ICD-10-CM | POA: Diagnosis not present

## 2016-08-30 ENCOUNTER — Other Ambulatory Visit: Payer: Self-pay | Admitting: *Deleted

## 2016-08-30 DIAGNOSIS — Z9981 Dependence on supplemental oxygen: Secondary | ICD-10-CM | POA: Diagnosis not present

## 2016-08-30 DIAGNOSIS — I5033 Acute on chronic diastolic (congestive) heart failure: Secondary | ICD-10-CM | POA: Diagnosis not present

## 2016-08-30 DIAGNOSIS — J441 Chronic obstructive pulmonary disease with (acute) exacerbation: Secondary | ICD-10-CM | POA: Diagnosis not present

## 2016-08-30 DIAGNOSIS — I110 Hypertensive heart disease with heart failure: Secondary | ICD-10-CM | POA: Diagnosis not present

## 2016-08-30 DIAGNOSIS — J449 Chronic obstructive pulmonary disease, unspecified: Secondary | ICD-10-CM | POA: Diagnosis not present

## 2016-08-30 DIAGNOSIS — I252 Old myocardial infarction: Secondary | ICD-10-CM | POA: Diagnosis not present

## 2016-08-30 DIAGNOSIS — E785 Hyperlipidemia, unspecified: Secondary | ICD-10-CM | POA: Diagnosis not present

## 2016-08-30 DIAGNOSIS — J9601 Acute respiratory failure with hypoxia: Secondary | ICD-10-CM | POA: Diagnosis not present

## 2016-08-30 DIAGNOSIS — I2510 Atherosclerotic heart disease of native coronary artery without angina pectoris: Secondary | ICD-10-CM | POA: Diagnosis not present

## 2016-08-30 NOTE — Patient Outreach (Signed)
[facility]) Care Management  08/30/2016  Karl Walker July 26, 1932 [id number]  I spoke with Karl Walker's daughter in law this afternoon regarding Karl Walker's progress and care coordination needs concerning DME and Christine.   Chronic Health Condition (COPD) - Karl Walker continues to use his O2 continuously at home and per report of [name], is gaining strength and says he is breathing easier and feeling less fatigued each day. The home health nurse continues to make weekly visits for assessment.   DME Needs - last week an order was sent to [place] for evaluation for a light weight O2 tank; Karl Walker had his appointment and was qualified for the tanks; he anticipates delivery tomorrow to his home   Level of Care Concerns/Care Needs - I requested an order for delivery of neb machine to the LEAF center in addition to completion and signing of [place]. I spoke with Verdis Frederickson, patient care coordinator who said that Dr. Hilma Favors will return on 09/03/16 and she will ask him to review, complete, and sign order and medical form. Anderson Malta will fax to [employer] at [facility] thereafter.   Advanced Directives - Karl Walker and his wife wished to make changes to the [facility]; I provided needed information and addressed questions about this on our initial home visit. [name] tells me all needed updates have been made and notarized. She will have the documents available for me to scan into the medical record on my next visit.   Plan: I will follow up with Karl Walker and his family early next week re: progress on items outlined above.    [place] Management  [phone]

## 2016-08-31 ENCOUNTER — Other Ambulatory Visit: Payer: Self-pay | Admitting: Licensed Clinical Social Worker

## 2016-08-31 DIAGNOSIS — J9601 Acute respiratory failure with hypoxia: Secondary | ICD-10-CM | POA: Diagnosis not present

## 2016-08-31 DIAGNOSIS — I2510 Atherosclerotic heart disease of native coronary artery without angina pectoris: Secondary | ICD-10-CM | POA: Diagnosis not present

## 2016-08-31 DIAGNOSIS — I252 Old myocardial infarction: Secondary | ICD-10-CM | POA: Diagnosis not present

## 2016-08-31 DIAGNOSIS — I110 Hypertensive heart disease with heart failure: Secondary | ICD-10-CM | POA: Diagnosis not present

## 2016-08-31 DIAGNOSIS — J441 Chronic obstructive pulmonary disease with (acute) exacerbation: Secondary | ICD-10-CM | POA: Diagnosis not present

## 2016-08-31 DIAGNOSIS — Z9981 Dependence on supplemental oxygen: Secondary | ICD-10-CM | POA: Diagnosis not present

## 2016-08-31 DIAGNOSIS — J449 Chronic obstructive pulmonary disease, unspecified: Secondary | ICD-10-CM | POA: Diagnosis not present

## 2016-08-31 DIAGNOSIS — I5033 Acute on chronic diastolic (congestive) heart failure: Secondary | ICD-10-CM | POA: Diagnosis not present

## 2016-08-31 DIAGNOSIS — E785 Hyperlipidemia, unspecified: Secondary | ICD-10-CM | POA: Diagnosis not present

## 2016-08-31 NOTE — Patient Outreach (Signed)
[facility]) Care Management  08/31/2016  Karl Walker 1932/10/17 [id number]   Assessment- CSW continues to receive updates from [place] who has been providing ongoing assistance with family. Dr. Hilma Favors returns on 09/03/16 and will sign and complete needed medical forms in order to start LEAF program.  Plan-CSW will continue to receive ongoing updates from [place].  Karl Walker, BSW, MSW, [email or web address] Phone: [phone] Fax: [phone]

## 2016-09-03 DIAGNOSIS — J441 Chronic obstructive pulmonary disease with (acute) exacerbation: Secondary | ICD-10-CM | POA: Diagnosis not present

## 2016-09-03 DIAGNOSIS — I110 Hypertensive heart disease with heart failure: Secondary | ICD-10-CM | POA: Diagnosis not present

## 2016-09-03 DIAGNOSIS — J449 Chronic obstructive pulmonary disease, unspecified: Secondary | ICD-10-CM | POA: Diagnosis not present

## 2016-09-03 DIAGNOSIS — I2510 Atherosclerotic heart disease of native coronary artery without angina pectoris: Secondary | ICD-10-CM | POA: Diagnosis not present

## 2016-09-03 DIAGNOSIS — E785 Hyperlipidemia, unspecified: Secondary | ICD-10-CM | POA: Diagnosis not present

## 2016-09-03 DIAGNOSIS — J9601 Acute respiratory failure with hypoxia: Secondary | ICD-10-CM | POA: Diagnosis not present

## 2016-09-03 DIAGNOSIS — I252 Old myocardial infarction: Secondary | ICD-10-CM | POA: Diagnosis not present

## 2016-09-03 DIAGNOSIS — I5033 Acute on chronic diastolic (congestive) heart failure: Secondary | ICD-10-CM | POA: Diagnosis not present

## 2016-09-03 DIAGNOSIS — Z9981 Dependence on supplemental oxygen: Secondary | ICD-10-CM | POA: Diagnosis not present

## 2016-09-04 ENCOUNTER — Other Ambulatory Visit: Payer: Self-pay | Admitting: *Deleted

## 2016-09-04 NOTE — Patient Outreach (Signed)
[facility]) Care Management  09/04/2016  Karl Walker 10-13-1932 [id number]  I reached out to Karl Walker, patient care coordinator today to follow up on Karl Walker form and signature by Dr. Hilma Walker. I spoke with Karl Walker's daughter in law who reports that Karl Walker is feeling well, gaining strength and energy, is eating well, and is taking all prescribed medications. He continues to struggle with memory loss.   Plan: I will continue to follow up with Karl Walker re: progress with COPD management and [place].    [place] Management  [phone]

## 2016-09-05 DIAGNOSIS — I5033 Acute on chronic diastolic (congestive) heart failure: Secondary | ICD-10-CM | POA: Diagnosis not present

## 2016-09-05 DIAGNOSIS — J441 Chronic obstructive pulmonary disease with (acute) exacerbation: Secondary | ICD-10-CM | POA: Diagnosis not present

## 2016-09-05 DIAGNOSIS — I252 Old myocardial infarction: Secondary | ICD-10-CM | POA: Diagnosis not present

## 2016-09-05 DIAGNOSIS — Z9981 Dependence on supplemental oxygen: Secondary | ICD-10-CM | POA: Diagnosis not present

## 2016-09-05 DIAGNOSIS — J449 Chronic obstructive pulmonary disease, unspecified: Secondary | ICD-10-CM | POA: Diagnosis not present

## 2016-09-05 DIAGNOSIS — I2510 Atherosclerotic heart disease of native coronary artery without angina pectoris: Secondary | ICD-10-CM | POA: Diagnosis not present

## 2016-09-05 DIAGNOSIS — I110 Hypertensive heart disease with heart failure: Secondary | ICD-10-CM | POA: Diagnosis not present

## 2016-09-05 DIAGNOSIS — J9601 Acute respiratory failure with hypoxia: Secondary | ICD-10-CM | POA: Diagnosis not present

## 2016-09-05 DIAGNOSIS — E785 Hyperlipidemia, unspecified: Secondary | ICD-10-CM | POA: Diagnosis not present

## 2016-09-07 DIAGNOSIS — I252 Old myocardial infarction: Secondary | ICD-10-CM | POA: Diagnosis not present

## 2016-09-07 DIAGNOSIS — I5033 Acute on chronic diastolic (congestive) heart failure: Secondary | ICD-10-CM | POA: Diagnosis not present

## 2016-09-07 DIAGNOSIS — J441 Chronic obstructive pulmonary disease with (acute) exacerbation: Secondary | ICD-10-CM | POA: Diagnosis not present

## 2016-09-07 DIAGNOSIS — Z9981 Dependence on supplemental oxygen: Secondary | ICD-10-CM | POA: Diagnosis not present

## 2016-09-07 DIAGNOSIS — E785 Hyperlipidemia, unspecified: Secondary | ICD-10-CM | POA: Diagnosis not present

## 2016-09-07 DIAGNOSIS — I2510 Atherosclerotic heart disease of native coronary artery without angina pectoris: Secondary | ICD-10-CM | POA: Diagnosis not present

## 2016-09-07 DIAGNOSIS — J9601 Acute respiratory failure with hypoxia: Secondary | ICD-10-CM | POA: Diagnosis not present

## 2016-09-07 DIAGNOSIS — I110 Hypertensive heart disease with heart failure: Secondary | ICD-10-CM | POA: Diagnosis not present

## 2016-09-07 DIAGNOSIS — J449 Chronic obstructive pulmonary disease, unspecified: Secondary | ICD-10-CM | POA: Diagnosis not present

## 2016-09-11 DIAGNOSIS — J449 Chronic obstructive pulmonary disease, unspecified: Secondary | ICD-10-CM | POA: Diagnosis not present

## 2016-09-11 DIAGNOSIS — I252 Old myocardial infarction: Secondary | ICD-10-CM | POA: Diagnosis not present

## 2016-09-11 DIAGNOSIS — E785 Hyperlipidemia, unspecified: Secondary | ICD-10-CM | POA: Diagnosis not present

## 2016-09-11 DIAGNOSIS — J441 Chronic obstructive pulmonary disease with (acute) exacerbation: Secondary | ICD-10-CM | POA: Diagnosis not present

## 2016-09-11 DIAGNOSIS — J9601 Acute respiratory failure with hypoxia: Secondary | ICD-10-CM | POA: Diagnosis not present

## 2016-09-11 DIAGNOSIS — I2510 Atherosclerotic heart disease of native coronary artery without angina pectoris: Secondary | ICD-10-CM | POA: Diagnosis not present

## 2016-09-11 DIAGNOSIS — I5033 Acute on chronic diastolic (congestive) heart failure: Secondary | ICD-10-CM | POA: Diagnosis not present

## 2016-09-11 DIAGNOSIS — Z9981 Dependence on supplemental oxygen: Secondary | ICD-10-CM | POA: Diagnosis not present

## 2016-09-11 DIAGNOSIS — I110 Hypertensive heart disease with heart failure: Secondary | ICD-10-CM | POA: Diagnosis not present

## 2016-09-18 ENCOUNTER — Other Ambulatory Visit: Payer: Self-pay | Admitting: *Deleted

## 2016-09-18 DIAGNOSIS — E785 Hyperlipidemia, unspecified: Secondary | ICD-10-CM | POA: Diagnosis not present

## 2016-09-18 DIAGNOSIS — J449 Chronic obstructive pulmonary disease, unspecified: Secondary | ICD-10-CM | POA: Diagnosis not present

## 2016-09-18 DIAGNOSIS — I5033 Acute on chronic diastolic (congestive) heart failure: Secondary | ICD-10-CM | POA: Diagnosis not present

## 2016-09-18 DIAGNOSIS — J9601 Acute respiratory failure with hypoxia: Secondary | ICD-10-CM | POA: Diagnosis not present

## 2016-09-18 DIAGNOSIS — I2510 Atherosclerotic heart disease of native coronary artery without angina pectoris: Secondary | ICD-10-CM | POA: Diagnosis not present

## 2016-09-18 DIAGNOSIS — Z9981 Dependence on supplemental oxygen: Secondary | ICD-10-CM | POA: Diagnosis not present

## 2016-09-18 DIAGNOSIS — I110 Hypertensive heart disease with heart failure: Secondary | ICD-10-CM | POA: Diagnosis not present

## 2016-09-18 DIAGNOSIS — J441 Chronic obstructive pulmonary disease with (acute) exacerbation: Secondary | ICD-10-CM | POA: Diagnosis not present

## 2016-09-18 DIAGNOSIS — I252 Old myocardial infarction: Secondary | ICD-10-CM | POA: Diagnosis not present

## 2016-09-18 NOTE — Patient Outreach (Signed)
[facility]) Care Management  09/18/2016  Karl Walker 12-21-1931 [id number]  I was unable to reach Karl Walker or Karl Walker by phone today. I left a message requesting a return call. I understand from communication with Karl Walker at the [facility] that Karl Walker has not yet enrolled in the LEAF program. Karl Walker has reached out to [facility] regarding coordination for admission to the program.   Plan: I will reach out to Karl Walker and Karl Walker by phone over the next 2 weeks.    [place] Management  [phone]

## 2016-09-20 ENCOUNTER — Other Ambulatory Visit: Payer: Self-pay | Admitting: Licensed Clinical Social Worker

## 2016-09-20 NOTE — Patient Outreach (Signed)
[facility]) Care Management  09/20/2016  Karl Walker [birth year] [id number]   Assessment- CSW received secure email from Karl Walker, Care Coordinator at [facility] that stated she had faxed over the forms they had on file to the [place] program today. CSW forwarded that message to Karl Walker with the LEAF program.   Plan-CSW will make outreach tomorrow to patient to follow up and provide social work assistance.   Karl Walker, BSW, MSW, [email or web address] Phone: [phone] Fax: [phone]

## 2016-09-21 ENCOUNTER — Other Ambulatory Visit: Payer: Self-pay | Admitting: Licensed Clinical Social Worker

## 2016-09-21 NOTE — Patient Outreach (Signed)
[facility] [facility]) Care Management  09/21/2016  Ayan Zamor Cokley [birth year] [id number]  Assessment- CSW received return email from Pollie Meyer stating that she will connect with the family in order to confirm a start date for the LEAF program. CSW completed outreach call to to patent's residence but was unable to reach family. CSW completed call to [facility] and was able to successfully reach her. HIPPA verifications provided. CSW provided updates to [place]. CSW provided contact number for Caryl Pina with the LEAF program and encouraged her to contact her if she has not heard from the program within the next few days. Helene Kelp was agreeable to do so and appreciative of this update. She states that patient continues to do "significantly better."   Plan-CSW will update [facility] RNCM and PCP. CSW will follow up within the next week.  [facility] CM Care Plan Problem One   Flowsheet Row Most Recent Value  Care Plan Problem One  Need for community resources and overall support  Role Documenting the Problem One  Clinical Social Worker  Care Plan for Problem One  Active  [facility] CM Short Term Goal #1 (0-30 days)  Patient will start the LEAF program within 30 days as evidenced by his need for this resource and support  [facility] CM Short Term Goal #1 Start Date  09/21/16  Interventions for Short Term Goal #1  CSW will assist family with gaining a start date for the LEAF program and coordinate care as needed.     [name], BSW, MSW, [email or web address] Phone: [phone] Fax: [phone]

## 2016-09-24 ENCOUNTER — Other Ambulatory Visit: Payer: Self-pay | Admitting: Licensed Clinical Social Worker

## 2016-09-24 NOTE — Patient Outreach (Signed)
[place] [facility]) Care Management  09/24/2016  Karl Walker 06-09-1932 [id number]   Assessment-CSW completed outreach attempt today to daughter in law. CSW unable to reach her successfully successfully. CSW left a HIPPA compliant voice message encouraging return call once available.  Plan-CSW will continue to provide social work assistance to patient and family.    Karl Walker, BSW, MSW, [email or web address] Phone: [phone] Fax: [phone]

## 2016-09-26 DIAGNOSIS — J449 Chronic obstructive pulmonary disease, unspecified: Secondary | ICD-10-CM | POA: Diagnosis not present

## 2016-09-26 DIAGNOSIS — I252 Old myocardial infarction: Secondary | ICD-10-CM | POA: Diagnosis not present

## 2016-09-26 DIAGNOSIS — I5033 Acute on chronic diastolic (congestive) heart failure: Secondary | ICD-10-CM | POA: Diagnosis not present

## 2016-09-26 DIAGNOSIS — Z9981 Dependence on supplemental oxygen: Secondary | ICD-10-CM | POA: Diagnosis not present

## 2016-09-26 DIAGNOSIS — I110 Hypertensive heart disease with heart failure: Secondary | ICD-10-CM | POA: Diagnosis not present

## 2016-09-26 DIAGNOSIS — J9601 Acute respiratory failure with hypoxia: Secondary | ICD-10-CM | POA: Diagnosis not present

## 2016-09-26 DIAGNOSIS — E785 Hyperlipidemia, unspecified: Secondary | ICD-10-CM | POA: Diagnosis not present

## 2016-09-26 DIAGNOSIS — I2510 Atherosclerotic heart disease of native coronary artery without angina pectoris: Secondary | ICD-10-CM | POA: Diagnosis not present

## 2016-09-26 DIAGNOSIS — J441 Chronic obstructive pulmonary disease with (acute) exacerbation: Secondary | ICD-10-CM | POA: Diagnosis not present

## 2016-10-01 ENCOUNTER — Other Ambulatory Visit: Payer: Self-pay | Admitting: *Deleted

## 2016-10-01 NOTE — Patient Outreach (Signed)
[facility]) Care Management  10/01/2016  Woodward Teply Fay 09/07/1932 [id number]  I was unable to reach Mr. Harten or his wife Otelia Sergeant at the home phone number today (unable to leave voice message) and was unable to reach his daughter in law Ms. Helene Kelp Thilges today by phone but left her a HIPPA compliant voice message requesting a return call.   When I last spoke with Mr. Breitenstein's family members, he was reportedly doing much better clinically and was completing paperwork and application for the [facility] program. Our LCSW has been working [facility] and Mr. Hauk and his family as well.   I reached out to [name], Director at the [facility] and [place] re: Mr. Gillin's progress.   I would like to perform clinical assessment related to Mr. Demonte's diagnosis of CHF and PNA.   Plan: I will try to reach Mr. Busbee again by phone later this week. If I am unable to reach him, I'll send a letter to his home indicating our ongoing willingness to assist with his care management needs.    [place] Management  [phone]

## 2016-10-05 ENCOUNTER — Ambulatory Visit: Payer: Self-pay | Admitting: *Deleted

## 2016-10-07 DIAGNOSIS — J441 Chronic obstructive pulmonary disease with (acute) exacerbation: Secondary | ICD-10-CM | POA: Diagnosis not present

## 2016-10-10 ENCOUNTER — Other Ambulatory Visit: Payer: Self-pay | Admitting: Licensed Clinical Social Worker

## 2016-10-10 NOTE — Patient Outreach (Signed)
[place] [facility]) Care Management  10/10/2016  Karl Walker Lynn September 11, 1932 [id number]   Assessment-CSW completed outreach attempt today to patient's residence. CSW unable to reach patient or family successfully. CSW unable to leave a voice message as well. CSW completed call to patient's daughter in law but was unable to reach her as well.   Plan-CSW will provide ongoing social work assistance and coordinate care with [place] and the [place] if patient and family are still willing.   [name], BSW, MSW, [email or web address] Phone: [phone] Fax: [phone]

## 2016-10-12 ENCOUNTER — Other Ambulatory Visit: Payer: Self-pay | Admitting: Licensed Clinical Social Worker

## 2016-10-12 NOTE — Patient Outreach (Signed)
[facility]) Care Management  10/12/2016  Melique Fuhrmann Berger [birth year] [id number]   Assessment- CSW has not received return call back from family. CSW sent secure emaill to [employer] with LEAF and asked if there had been any follow up with patient starting services.   Plan-CSW will await for reply for LEAF program and family. CSW will assess if there are any further social work needs within the next two weeks.  [name], BSW, MSW, [email or web address] Phone: [phone] Fax: [phone]

## 2016-10-17 ENCOUNTER — Encounter: Payer: Self-pay | Admitting: *Deleted

## 2016-10-17 ENCOUNTER — Other Ambulatory Visit: Payer: Self-pay | Admitting: Licensed Clinical Social Worker

## 2016-10-17 ENCOUNTER — Other Ambulatory Visit: Payer: Self-pay | Admitting: *Deleted

## 2016-10-17 NOTE — Patient Outreach (Signed)
[facility]) Care Management  10/17/2016  Karl Walker 08-Oct-1932 [id number]   Assessment- CSW received notification from [place]. Patient has drastically improved over the past few weeks allowing family to leave patient for a few hours to run errands. Family is agreeable to case closure at this time.   Plan-CSW will complete case closure and will notify [place] Management Assistant.  [name], BSW, MSW, [email or web address] Phone: [phone] Fax: [phone]

## 2016-10-17 NOTE — Patient Outreach (Signed)
[place] [facility]) Care Management  10/17/2016  Karl Walker Katen 10/20/1932 [id number]  I received a call from Mrs. Karl Walker today. She called to express her thanks for our assistance and to let us know that [name] is doing much better at home with resources in place. She says he still has poor short term memory and confusion at times but has improved to the degree that she feel safe leaving the house for a few hours at a time. She has been able to go play Bingo twice now over the last few weeks. She reports that [name] is going to his doctor appointments and that he is using his oxygen although she has to remind him to put in on or back on frequently. She says that at this time their plan is for Mr. Esters to remain at home with her and the support of their children.   Plan: I told Mrs. Schneck I would officially close [name] but that [place] Management would be available to [name] at any time in the future should he have case management needs.   I notiifed [name]'s primary care provider, Dr. Sharilyn Sites of the discharge.    [place] Management  [phone]

## 2016-11-06 ENCOUNTER — Encounter: Payer: Self-pay | Admitting: Family

## 2016-11-07 DIAGNOSIS — J441 Chronic obstructive pulmonary disease with (acute) exacerbation: Secondary | ICD-10-CM | POA: Diagnosis not present

## 2016-11-12 ENCOUNTER — Ambulatory Visit (HOSPITAL_COMMUNITY)
Admission: RE | Admit: 2016-11-12 | Discharge: 2016-11-12 | Disposition: A | Discharge status: Home / Self Care | Payer: Commercial Managed Care - HMO | Source: Ambulatory Visit | Attending: Family | Admitting: Family

## 2016-11-12 ENCOUNTER — Ambulatory Visit (INDEPENDENT_AMBULATORY_CARE_PROVIDER_SITE_OTHER): Payer: Commercial Managed Care - HMO | Admitting: Family

## 2016-11-12 ENCOUNTER — Encounter: Payer: Self-pay | Admitting: Family

## 2016-11-12 VITALS — BP 147/75 | HR 79 | Temp 96.8°F | Resp 16 | Ht 70.0 in | Wt 157.0 lb

## 2016-11-12 DIAGNOSIS — I714 Abdominal aortic aneurysm, without rupture, unspecified: Secondary | ICD-10-CM

## 2016-11-12 DIAGNOSIS — F17200 Nicotine dependence, unspecified, uncomplicated: Secondary | ICD-10-CM

## 2016-11-12 DIAGNOSIS — Z95828 Presence of other vascular implants and grafts: Secondary | ICD-10-CM | POA: Insufficient documentation

## 2016-11-12 DIAGNOSIS — Z87891 Personal history of nicotine dependence: Secondary | ICD-10-CM

## 2016-11-12 NOTE — Progress Notes (Signed)
[facility] [place]  CC: Follow up s/p EVAR  History of Present Illness  Karl Walker is a 83 y.o. ([birth year]) male patient of Dr. 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 His initial CT scan showed the graft to be in good position without evidence of endoleak.  At a previous visit Dr. Trula Slade reviewed his carotid duplex from pre-CABG which showed no significant carotid disease.  Pt is regularly physically active, but states his cardiologist told him to not engage in strenuous activity. Had one MI in 2006, one cardiac stent.  He presents today for routine surveillance of the EVAR.  The patient has not had back or abdominal pain.Pt denies any history of stroke or TIA. He denies claudication sx's with walking.  He had melanoma removed left shoulder in 2015, several basal cell carcinomas removed on his face. He had prostate and bladder surgery February 2015, apparently for BPH. He states he voids normally.  He takes a daily 81 mg ASA, no other antiplatelets nor anticoagulants.   Pt Diabetic: No Pt smoker: former smoker, quit December 2017, started in his 96's   Past Medical History:  Diagnosis Date  . Abdominal aneurysm ([place])    scheduled procedure to take care of this 07/04/12 with Dr.Brabham  . Benign prostatic hyperplasia with urinary obstruction 12/09/2013  . Bladder diverticulum   . Bronchitis   . Bruises easily   . CAD (coronary artery disease) stent in 2007  . Cancer ([place])    basal cell ca on face  . Chest congestion    occasionally per pt and wife  . COPD (chronic obstructive pulmonary disease) ([place])    PT IS A SMOKER  . Dry skin   . Enlarged prostate    takes Proscar  . Hx of colonic polyps   . Hypercholesterolemia    takes Niacin nightly  . Hypertension    takes Hyzaar daily  . MI (myocardial infarction) 1996 and 2006   . Pneumonia    last time 89yrs ago  . Shortness of breath    with exertion  .  Tracheal mass    s/p endoscopic resection  . Urinary frequency    Past Surgical History:  Procedure Laterality Date  . ABDOMINAL AORTIC ANEURYSM REPAIR  07-04-12   EVAR  . COLONOSCOPY    . COLONOSCOPY  Oct 2011   RMR: multiple colon polyps, left-sided diverticula, adenomatous, needs surveillance Oct 2014  . CORONARY ANGIOPLASTY WITH STENT PLACEMENT  2006  . CORONARY ANGIOPLASTY WITH STENT PLACEMENT  12/17/2006   Stent to the RCA  . CYSTOSCOPY N/A 12/11/2013   Procedure: CYSTOSCOPY with evacuation of clots and fulguration of bleeders. ;  Surgeon: Claybon Jabs, MD;  Location: [facility];  Service: Urology;  Laterality: N/A;  . CYSTOSCOPY W/ RETROGRADES Bilateral 12/10/2013   Procedure: CYSTOSCOPY WITH RETROGRADE PYELOGRAM;  Surgeon: [name], MD;  Location: [facility];  Service: Urology;  Laterality: Bilateral;  . ESOPHAGOGASTRODUODENOSCOPY  Oct 2011   RMR: normal esophagus, J-shaped stomach, mottled gastric mucosa, bulbar erosions: path minimal chronic gastritis  . HERNIA REPAIR     right inguinal  . NM MYOCAR PERF WALL MOTION  06/18/2012   No ischemia  . SKIN SURGERY    . TRANSURETHRAL RESECTION OF PROSTATE N/A 12/10/2013   Procedure: TRANSURETHRAL RESECTION OF THE PROSTATE (TURP) with bladder biopsy with fulgration;  Surgeon: [name], MD;  Location: [facility];  Service: Urology;  Laterality: N/A;  . TUMOR REMOVAL  2013   esophagus  . US ECHOCARDIOGRAPHY  05/24/2009   mild AI,MR  . VIDEO BRONCHOSCOPY  06/25/2012   Procedure: VIDEO BRONCHOSCOPY;  Surgeon: Melrose Nakayama, MD;  Location: [place];  Service: Thoracic;  Laterality: N/A;  Using Snare with biopsy   Social History Social History  Substance Use Topics  . Smoking status: Current Some Day Smoker    Packs/day: 0.25    Years: 55.00    Types: Cigarettes  . Smokeless tobacco: Never Used  . Alcohol use No   Family History Family History  Problem Relation Age of Onset  . Stroke Brother    Current Outpatient Prescriptions on File  Prior to Visit  Medication Sig Dispense Refill  . albuterol (PROVENTIL) (2.5 MG/3ML) 0.083% nebulizer solution Take 2.5 mg by nebulization daily as needed for wheezing or shortness of breath. And/or Congestion    . cholecalciferol (VITAMIN D) 1000 UNITS tablet Take 1,000 Units by mouth daily.    . ferrous sulfate 325 (65 FE) MG EC tablet Take 325 mg by mouth daily with breakfast.    . finasteride (PROSCAR) 5 MG tablet Take 5 mg by mouth daily.    . furosemide (LASIX) 40 MG tablet Take 1 tablet (40 mg total) by mouth 2 (two) times daily. 60 tablet 0  . isosorbide mononitrate (IMDUR) 30 MG 24 hr tablet Take 1 tablet (30 mg total) by mouth daily. 30 tablet 0  . levofloxacin (LEVAQUIN) 500 MG tablet Take 1 tablet (500 mg total) by mouth daily. 5 tablet 0  . losartan (COZAAR) 25 MG tablet Take 1 tablet (25 mg total) by mouth daily. 30 tablet 0  . niacin 500 MG tablet Take 500 mg by mouth daily.     . predniSONE (DELTASONE) 10 MG tablet Take 1 tablet (10 mg total) by mouth daily with breakfast. 21 tablet 0  . carvedilol (COREG) 3.125 MG tablet Take 1 tablet (3.125 mg total) by mouth 2 (two) times daily with a meal. (Patient not taking: Reported on 11/12/2016) 60 tablet 0  . Saw Palmetto 1000 MG CAPS Take 1 capsule by mouth daily.    Marland Kitchen spironolactone (ALDACTONE) 25 MG tablet Take 0.5 tablets (12.5 mg total) by mouth daily. (Patient not taking: Reported on 11/12/2016) 30 tablet 0   No current facility-administered medications on file prior to visit.    Allergies  Allergen Reactions  . Iodine Other (See Comments)    Per pt, issue with shrimp x 20 years ago, ok with CT contrast since     ROS: See HPI for pertinent positives and negatives.  Physical Examination  Vitals:   11/12/16 1049  BP: (!) 147/75  Pulse: 79  Resp: 16  Temp: (!) 96.8 F (36 C)  TempSrc: Oral  SpO2: 92%  Weight: 157 lb (71.2 kg)  Height: 5\' 10"  (1.778 m)   Body mass index is 22.53 kg/m.  General: A&O x 3, WD, thin  male.  Pulmonary: Sym exp, slightly labored respirations at rest, decreased air movement in all fields,  + wheezes in all fields, no rales or rhonchi. Wearing nasal canula connected to supplemental O2 source.   Cardiac: RRR, Nl S1, S2, no murmur detected.  Vascular: Vessel Right Left  Radial 2+Palpable 2+Palpable  Carotid without bruit without bruit  Aorta Not palpable N/A  Femoral Palpable Palpable  Popliteal Not palpable Not palpable  PT Palpable Palpable  DP Palpable Palpable   Gastrointestinal: soft, NTND, -G/R, - HSM, - palpable masses, - CVAT B.  Musculoskeletal: M/S 5/5 throughout, Extremities without ischemic changes.  Neurologic: Pain and light touch intact in extremities, Motor exam as listed above, CN 2-12 intact except for hard of hearing.     Non-Invasive Vascular Imaging  EVAR Duplex (Date: 11-12-16)  AAA sac size: 4.2 cm x 4.2 cm; right CIA: 1.3 cm; left CIA: 1.4 cm  11-07-15: 4.1 cm x 4.3 cm  no endoleak detected   Medical Decision Making  KONYE HUNDLEY is a 83 y.o. male who presents s/p EVAR (Date: 07/04/2012).  Pt is asymptomatic with stable sac size.  I discussed with the patient the importance of surveillance of the endograft.  The next endograft duplex will be scheduled for 12 months.  The patient will follow up with [place] in 12 months with these studies.  I emphasized the importance of maximal medical management including strict control of blood pressure, blood glucose, and lipid levels, antiplatelet agents, obtaining regular exercise, and cessation of smoking.   Thank you for allowing [place] to participate in this patient's care.  Clemon Chambers, RN, MSN, FNP-C [facility] Office: [facility] Physician: Trula Slade  11/12/2016, 11:19 AM

## 2016-11-16 NOTE — Addendum Note (Signed)
Addended by: Lianne Cure A on: 11/16/2016 11:58 AM   Modules accepted: Orders

## 2016-11-17 DIAGNOSIS — R404 Transient alteration of awareness: Secondary | ICD-10-CM | POA: Diagnosis not present

## 2016-11-21 ENCOUNTER — Other Ambulatory Visit: Payer: Self-pay | Admitting: *Deleted

## 2016-11-21 NOTE — Patient Outreach (Signed)
[place] [facility]) Care Management  11/21/2016  Tareek Thelen Wierzbicki January 23, 1932 [id number]  Mrs. Kathe Becton, daughter in law of Mr. Frisco Staiger, called to inform me of Mr. Santo's passing and to express her appreciation for the care provided to Mr. Rainey by [place] Management.    [place] Management  [phone]

## 2016-11-22 DIAGNOSIS — 419620001 Death: Secondary | SNOMED CT | POA: Diagnosis not present

## 2016-11-22 DEATH — deceased

## 2017-11-25 ENCOUNTER — Other Ambulatory Visit (HOSPITAL_COMMUNITY): Payer: Commercial Managed Care - HMO

## 2017-11-25 ENCOUNTER — Ambulatory Visit: Payer: Commercial Managed Care - HMO | Admitting: Family
